# Patient Record
Sex: Male | Born: 1940 | Race: White | Hispanic: No | Marital: Married | Smoking: Never smoker
Health system: Southern US, Community
[De-identification: ages and names within clinical notes are randomized; demographics above are authoritative.]

## PROBLEM LIST (undated history)

## (undated) DIAGNOSIS — E119 Type 2 diabetes mellitus without complications: Secondary | ICD-10-CM

## (undated) DIAGNOSIS — E7800 Pure hypercholesterolemia, unspecified: Secondary | ICD-10-CM

## (undated) DIAGNOSIS — I639 Cerebral infarction, unspecified: Secondary | ICD-10-CM

## (undated) HISTORY — PX: BACK SURGERY: SHX140

## (undated) HISTORY — PX: HIP SURGERY: SHX245

---

## 2006-08-14 HISTORY — PX: PENILE PROSTHESIS IMPLANT: SHX240

## 2016-09-25 ENCOUNTER — Emergency Department (HOSPITAL_BASED_OUTPATIENT_CLINIC_OR_DEPARTMENT_OTHER): Payer: Medicare Other

## 2016-09-25 ENCOUNTER — Encounter (HOSPITAL_BASED_OUTPATIENT_CLINIC_OR_DEPARTMENT_OTHER): Payer: Self-pay | Admitting: Emergency Medicine

## 2016-09-25 ENCOUNTER — Emergency Department (HOSPITAL_BASED_OUTPATIENT_CLINIC_OR_DEPARTMENT_OTHER)
Admission: EM | Admit: 2016-09-25 | Discharge: 2016-09-25 | Disposition: A | Discharge status: Home / Self Care | Payer: Medicare Other | Attending: Emergency Medicine | Admitting: Emergency Medicine

## 2016-09-25 DIAGNOSIS — S0591XA Unspecified injury of right eye and orbit, initial encounter: Secondary | ICD-10-CM

## 2016-09-25 DIAGNOSIS — W228XXA Striking against or struck by other objects, initial encounter: Secondary | ICD-10-CM | POA: Diagnosis not present

## 2016-09-25 DIAGNOSIS — Y929 Unspecified place or not applicable: Secondary | ICD-10-CM | POA: Insufficient documentation

## 2016-09-25 DIAGNOSIS — Y9389 Activity, other specified: Secondary | ICD-10-CM | POA: Diagnosis not present

## 2016-09-25 DIAGNOSIS — E119 Type 2 diabetes mellitus without complications: Secondary | ICD-10-CM | POA: Insufficient documentation

## 2016-09-25 DIAGNOSIS — Z7984 Long term (current) use of oral hypoglycemic drugs: Secondary | ICD-10-CM | POA: Diagnosis not present

## 2016-09-25 DIAGNOSIS — S01111A Laceration without foreign body of right eyelid and periocular area, initial encounter: Secondary | ICD-10-CM | POA: Diagnosis not present

## 2016-09-25 DIAGNOSIS — Y999 Unspecified external cause status: Secondary | ICD-10-CM | POA: Diagnosis not present

## 2016-09-25 DIAGNOSIS — R220 Localized swelling, mass and lump, head: Secondary | ICD-10-CM | POA: Insufficient documentation

## 2016-09-25 HISTORY — DX: Pure hypercholesterolemia, unspecified: E78.00

## 2016-09-25 HISTORY — DX: Type 2 diabetes mellitus without complications: E11.9

## 2016-09-25 MED ORDER — ERYTHROMYCIN 5 MG/GM OP OINT
TOPICAL_OINTMENT | Freq: Once | OPHTHALMIC | Status: AC
  Administered 2016-09-25: 20:00:00 via OPHTHALMIC
  Filled 2016-09-25: qty 3.5

## 2016-09-25 MED ORDER — TRAMADOL HCL 50 MG PO TABS: 50.0000 mg | ORAL_TABLET | Freq: Four times a day (QID) | ORAL | 0 refills | Status: DC | PRN

## 2016-09-25 MED ORDER — TETRACAINE HCL 0.5 % OP SOLN
2.0000 [drp] | Freq: Once | OPHTHALMIC | Status: AC
  Administered 2016-09-25: 2 [drp] via OPHTHALMIC

## 2016-09-25 MED ORDER — TETANUS-DIPHTH-ACELL PERTUSSIS 5-2.5-18.5 LF-MCG/0.5 IM SUSP
0.5000 mL | Freq: Once | INTRAMUSCULAR | Status: AC
  Administered 2016-09-25: 0.5 mL via INTRAMUSCULAR
  Filled 2016-09-25: qty 0.5

## 2016-09-25 MED ORDER — TETRACAINE HCL 0.5 % OP SOLN
OPHTHALMIC | Status: AC
  Administered 2016-09-25: 2 [drp] via OPHTHALMIC
  Filled 2016-09-25: qty 4

## 2016-09-25 MED ORDER — FLUORESCEIN SODIUM 1 MG OP STRP
1.0000 | ORAL_STRIP | Freq: Once | OPHTHALMIC | Status: AC
  Administered 2016-09-25: 1 via OPHTHALMIC
  Filled 2016-09-25: qty 1

## 2016-09-25 MED ORDER — ERYTHROMYCIN 5 MG/GM OP OINT: 1.0000 "application " | TOPICAL_OINTMENT | Freq: Every day | OPHTHALMIC | 0 refills | Status: DC
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
## 2016-09-25 NOTE — ED Notes (Signed)
ED Provider at bedside. 

## 2016-09-25 NOTE — Discharge Instructions (Signed)
Follow-up with the ophthalmologist at 1:15 PM tomorrow afternoon. Return immediately for worsening pain, changes in your vision, weakness, numbness, persistent vomiting or for any concerns.  Carmela RimaNarendra Patel, M.D.  [address], [place] [address]  [phone]
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
## 2017-02-15 ENCOUNTER — Emergency Department (HOSPITAL_BASED_OUTPATIENT_CLINIC_OR_DEPARTMENT_OTHER)
Admission: EM | Admit: 2017-02-15 | Discharge: 2017-02-15 | Disposition: A | Discharge status: Home / Self Care | Payer: Medicare Other | Attending: Emergency Medicine | Admitting: Emergency Medicine

## 2017-02-15 ENCOUNTER — Encounter (HOSPITAL_BASED_OUTPATIENT_CLINIC_OR_DEPARTMENT_OTHER): Payer: Self-pay | Admitting: Emergency Medicine

## 2017-02-15 ENCOUNTER — Emergency Department (HOSPITAL_BASED_OUTPATIENT_CLINIC_OR_DEPARTMENT_OTHER): Payer: Medicare Other

## 2017-02-15 DIAGNOSIS — Z7984 Long term (current) use of oral hypoglycemic drugs: Secondary | ICD-10-CM | POA: Insufficient documentation

## 2017-02-15 DIAGNOSIS — M25512 Pain in left shoulder: Secondary | ICD-10-CM | POA: Diagnosis present

## 2017-02-15 DIAGNOSIS — E119 Type 2 diabetes mellitus without complications: Secondary | ICD-10-CM | POA: Diagnosis not present

## 2017-02-15 DIAGNOSIS — M19012 Primary osteoarthritis, left shoulder: Secondary | ICD-10-CM | POA: Diagnosis not present

## 2017-02-15 MED ORDER — NAPROXEN 250 MG PO TABS
500.0000 mg | ORAL_TABLET | Freq: Once | ORAL | Status: AC
  Administered 2017-02-15: 500 mg via ORAL
  Filled 2017-02-15: qty 2

## 2017-02-15 MED ORDER — ACETAMINOPHEN 325 MG PO TABS: 650.0000 mg | ORAL_TABLET | Freq: Four times a day (QID) | ORAL | 0 refills | Status: DC | PRN

## 2017-02-15 MED ORDER — ACETAMINOPHEN 325 MG PO TABS
650.0000 mg | ORAL_TABLET | Freq: Once | ORAL | Status: AC
  Administered 2017-02-15: 650 mg via ORAL
  Filled 2017-02-15: qty 2

## 2017-02-15 NOTE — ED Notes (Signed)
Pt and wife given d/c instructions as per chart. Rx x 1. Verbalizes understanding. No questions. 

## 2017-02-15 NOTE — ED Notes (Signed)
Pt states he has had pain in his left shoulder for awhile. +radial pulse. Moves fingers. Feels touch. Cap refill < 3 sec.

## 2017-02-15 NOTE — ED Notes (Signed)
ED Provider at bedside. 

## 2017-02-15 NOTE — Discharge Instructions (Signed)
Please read and follow all provided instructions.  Your diagnoses today include:  1. Osteoarthritis of left shoulder, unspecified osteoarthritis type     Tests performed today include: Vital signs. See below for your results today.   Medications prescribed:  Take as prescribed   Home care instructions:  Follow any educational materials contained in this packet.  Follow-up instructions: Please follow-up with your Orthopedic provider for further evaluation of symptoms and treatment   Return instructions:  Please return to the Emergency Department if you do not get better, if you get worse, or new symptoms OR  - Fever (temperature greater than 101.28F)  - Bleeding that does not stop with holding pressure to the area    -Severe pain (please note that you may be more sore the day after your accident)  - Chest Pain  - Difficulty breathing  - Severe nausea or vomiting  - Inability to tolerate food and liquids  - Passing out  - Skin becoming red around your wounds  - Change in mental status (confusion or lethargy)  - New numbness or weakness    Please return if you have any other emergent concerns.  Additional Information:  Your vital signs today were: BP (!) 145/69 (BP Location: Right Arm)    Pulse 88    Temp 98.4 F (36.9 C) (Oral)    Resp 20    Ht  (1.753 m)    Wt 88.5 kg    SpO2 98%    BMI 28.80 kg/m  If your blood pressure (BP) was elevated above 135/85 this visit, please have this repeated by your doctor within one month. ---------------

## 2017-02-15 NOTE — ED Provider Notes (Signed)
[facility]   CSN: [id number] Arrival date & time: 02/15/17  2035  By signing my name below, I, Nelwyn Salisbury, attest that this documentation has been prepared under the direction and in the presence of non-physician practitioner, Audry Pili, PA-C. Electronically Signed: Nelwyn Salisbury, Scribe. 02/15/2017. 9:47 PM.  History   Chief Complaint Chief Complaint  Patient presents with  . Shoulder Pain   The history is provided by the patient. No language interpreter was used.    HPI Comments:  Jake Bartlett is a 76 y.o. male with pmhx of DM who presents to the Emergency Department complaining of  constant, moderate but worsening left shoulder pain onset yesterday. His pain is worsened with palpation and ROM. Pt has tried aspirin for his symptoms with no relief. Denies any numbness or weakness. Pt is right-handed. He has had a pshx of hip replacement with Dr. Jearl Klinefelter, Orthopedist at [facility].   Past Medical History:  Diagnosis Date  . Diabetes mellitus without complication (HCC)   . High cholesterol     There are no active problems to display for this patient.   Past Surgical History:  Procedure Laterality Date  . HIP SURGERY         Home Medications    Prior to Admission medications   Medication Sig Start Date End Date Taking? Authorizing Provider  erythromycin ophthalmic ointment Place 1 application into the right eye daily. 09/26/16   Loren Racer, MD  metFORMIN (GLUCOPHAGE) 1000 MG tablet Take 1,000 mg by mouth 2 (two) times daily with a meal.    Historical Provider, MD  traMADol (ULTRAM) 50 MG tablet Take 1 tablet (50 mg total) by mouth every 6 (six) hours as needed. 09/25/16   Loren Racer, MD    Family History No family history on file.  Social History Social History  Substance Use Topics  . Smoking status: Never Smoker  . Smokeless tobacco: Never Used  . Alcohol use No     Allergies   Patient has no known  allergies.   Review of Systems Review of Systems  Musculoskeletal: Positive for arthralgias (Left Shoulder).  Neurological: Negative for weakness and numbness.  All other systems reviewed and are negative.    Physical Exam Updated Vital Signs BP (!) 145/69 (BP Location: Right Arm)   Pulse 88   Temp 98.4 F (36.9 C) (Oral)   Resp 20   Ht  (1.753 m)   Wt 195 lb (88.5 kg)   SpO2 98%   BMI 28.80 kg/m   Physical Exam  Constitutional: He is oriented to person, place, and time. He appears well-developed and well-nourished. No distress.  HENT:  Head: Normocephalic and atraumatic.  Eyes: Conjunctivae are normal.  Cardiovascular: Normal rate.   Pulmonary/Chest: Effort normal.  Abdominal: He exhibits no distension.  Musculoskeletal: He exhibits tenderness.  Left shoulder limited ROM due to pain. No obvious swelling. No erythema.  Neurovascularly intact with distal pulses appreciated.   Neurological: He is alert and oriented to person, place, and time.  Skin: Skin is warm and dry.  Psychiatric: He has a normal mood and affect.  Nursing note and vitals reviewed.   ED Treatments / Results  DIAGNOSTIC STUDIES:  Oxygen Saturation is 98% on RA, normal by my interpretation.    COORDINATION OF CARE:  9:52 PM Discussed treatment plan with pt at bedside which includes follow-up with his orthopedist and pt agreed to plan.  Labs (all labs ordered are listed,  but only abnormal results are displayed) Labs Reviewed - No data to display  EKG  EKG Interpretation None       Radiology Dg Shoulder Left  Result Date: 02/15/2017 CLINICAL DATA:  Left shoulder pain beginning last night. EXAM: LEFT SHOULDER - 2+ VIEW COMPARISON:  None. FINDINGS: Advanced osteoarthritis of the glenohumeral joint with loss of joint space and marginal osteophytes. Normal humeral acromial distance. AC joint is normal. IMPRESSION: Advanced osteoarthritis of the glenohumeral joint. Electronically Signed    By: Paulina Fusi M.D.   On: 02/15/2017 21:22    Procedures Procedures (including critical care time)  Medications Ordered in ED Medications - No data to display   Initial Impression / Assessment and Plan / ED Course  I have reviewed the triage vital signs and the nursing notes.  Pertinent labs & imaging results that were available during my care of the patient were reviewed by me and considered in my medical decision making (see chart for details).   {I have reviewed and evaluated the relevant imaging studies.  {I have reviewed the relevant previous healthcare records.  {I obtained HPI from historian.   ED Course:  Assessment:  Patient X-Ray negative for obvious fracture or dislocation. Shows advanced osteoarthritis.  Pt advised to follow up with orthopedics. Patient given sling while in ED, conservative therapy recommended and discussed. Patient will be discharged home & is agreeable with above plan. Returns precautions discussed. Pt appears safe for discharge.  Disposition/Plan:  DC Home Additional Verbal discharge instructions given and discussed with patient.  Pt Instructed to f/u with PCP in the next week for evaluation and treatment of symptoms. Return precautions given Pt acknowledges and agrees with plan  Supervising Physician Shaune Pollack, MD  Final Clinical Impressions(s) / ED Diagnoses   Final diagnoses:  Osteoarthritis of left shoulder, unspecified osteoarthritis type    New Prescriptions New Prescriptions   No medications on file   I personally performed the services described in this documentation, which was scribed in my presence. The recorded information has been reviewed and is accurate.    Audry Pili, PA-C 02/15/17 2203    Shaune Pollack, MD 02/16/17 [phone]

## 2017-02-15 NOTE — ED Triage Notes (Signed)
Pt presents to ED with c/o left shoulder pain that started last night and has been "loose" for about a year.

## 2022-02-24 ENCOUNTER — Emergency Department (HOSPITAL_BASED_OUTPATIENT_CLINIC_OR_DEPARTMENT_OTHER)
Admission: EM | Admit: 2022-02-24 | Discharge: 2022-02-24 | Disposition: A | Discharge status: Home / Self Care | Payer: Medicare Other | Attending: Emergency Medicine | Admitting: Emergency Medicine

## 2022-02-24 ENCOUNTER — Encounter (HOSPITAL_BASED_OUTPATIENT_CLINIC_OR_DEPARTMENT_OTHER): Payer: Self-pay | Admitting: Emergency Medicine

## 2022-02-24 ENCOUNTER — Emergency Department (HOSPITAL_BASED_OUTPATIENT_CLINIC_OR_DEPARTMENT_OTHER): Payer: Medicare Other

## 2022-02-24 ENCOUNTER — Other Ambulatory Visit: Payer: Self-pay

## 2022-02-24 DIAGNOSIS — D649 Anemia, unspecified: Secondary | ICD-10-CM | POA: Diagnosis not present

## 2022-02-24 DIAGNOSIS — N189 Chronic kidney disease, unspecified: Secondary | ICD-10-CM | POA: Insufficient documentation

## 2022-02-24 DIAGNOSIS — N132 Hydronephrosis with renal and ureteral calculous obstruction: Secondary | ICD-10-CM | POA: Insufficient documentation

## 2022-02-24 DIAGNOSIS — R1031 Right lower quadrant pain: Secondary | ICD-10-CM | POA: Diagnosis present

## 2022-02-24 DIAGNOSIS — N201 Calculus of ureter: Secondary | ICD-10-CM

## 2022-02-24 LAB — BASIC METABOLIC PANEL
Anion gap: 8 (ref 5–15)
BUN: 42 mg/dL — ABNORMAL HIGH (ref 8–23)
CO2: 19 mmol/L — ABNORMAL LOW (ref 22–32)
Calcium: 8.5 mg/dL — ABNORMAL LOW (ref 8.9–10.3)
Chloride: 109 mmol/L (ref 98–111)
Creatinine, Ser: 3.62 mg/dL — ABNORMAL HIGH (ref 0.61–1.24)
GFR, Estimated: 16 mL/min — ABNORMAL LOW (ref >60)
Glucose, Bld: 176 mg/dL — ABNORMAL HIGH (ref 70–99)
Potassium: 4.3 mmol/L (ref 3.5–5.1)
Sodium: 136 mmol/L (ref 135–145)

## 2022-02-24 LAB — URINALYSIS, ROUTINE W REFLEX MICROSCOPIC
Bilirubin Urine: NEGATIVE
Glucose, UA: 100 mg/dL — AB
Ketones, ur: NEGATIVE mg/dL
Leukocytes,Ua: NEGATIVE
Nitrite: NEGATIVE
Protein, ur: 30 mg/dL — AB
Specific Gravity, Urine: 1.015 (ref 1.005–1.030)
pH: 5.5 (ref 5.0–8.0)

## 2022-02-24 LAB — CBC WITH DIFFERENTIAL/PLATELET
Abs Immature Granulocytes: 0.01 10*3/uL (ref 0.00–0.07)
Basophils Absolute: 0.1 10*3/uL (ref 0.0–0.1)
Basophils Relative: 1 %
Eosinophils Absolute: 0.3 10*3/uL (ref 0.0–0.5)
Eosinophils Relative: 5 %
HCT: 24.5 % — ABNORMAL LOW (ref 39.0–52.0)
Hemoglobin: 8.4 g/dL — ABNORMAL LOW (ref 13.0–17.0)
Immature Granulocytes: 0 %
Lymphocytes Relative: 21 %
Lymphs Abs: 1.3 10*3/uL (ref 0.7–4.0)
MCH: 32.7 pg (ref 26.0–34.0)
MCHC: 34.3 g/dL (ref 30.0–36.0)
MCV: 95.3 fL (ref 80.0–100.0)
Monocytes Absolute: 0.7 10*3/uL (ref 0.1–1.0)
Monocytes Relative: 12 %
Neutro Abs: 3.9 10*3/uL (ref 1.7–7.7)
Neutrophils Relative %: 61 %
Platelets: 334 10*3/uL (ref 150–400)
RBC: 2.57 MIL/uL — ABNORMAL LOW (ref 4.22–5.81)
RDW: 12.4 % (ref 11.5–15.5)
WBC: 6.3 10*3/uL (ref 4.0–10.5)
nRBC: 0 % (ref 0.0–0.2)

## 2022-02-24 LAB — URINALYSIS, MICROSCOPIC (REFLEX)

## 2022-02-24 MED ORDER — ONDANSETRON HCL 4 MG/2ML IJ SOLN
4.0000 mg | Freq: Once | INTRAMUSCULAR | Status: AC
  Administered 2022-02-24: 4 mg via INTRAVENOUS
  Filled 2022-02-24: qty 2

## 2022-02-24 MED ORDER — OXYCODONE-ACETAMINOPHEN 5-325 MG PO TABS: 1.0000 | ORAL_TABLET | Freq: Four times a day (QID) | ORAL | 0 refills | Status: DC | PRN

## 2022-02-24 MED ORDER — OXYCODONE-ACETAMINOPHEN 5-325 MG PO TABS
1.0000 | ORAL_TABLET | Freq: Once | ORAL | Status: AC
  Administered 2022-02-24: 1 via ORAL
  Filled 2022-02-24: qty 1

## 2022-02-24 MED ORDER — TAMSULOSIN HCL 0.4 MG PO CAPS: 0.4000 mg | ORAL_CAPSULE | Freq: Every day | ORAL | 0 refills | Status: DC

## 2022-02-24 NOTE — ED Triage Notes (Signed)
Pt arrives pov, slow gait with cane, c/o right flank pain with nausea acutely today, hx of kidney stones. Denies dysuria, or fever. Tylenol 500 mg pta ?

## 2022-02-24 NOTE — ED Notes (Signed)
Pt verbalizes understanding of discharge instructions. Opportunity for questioning and answers were provided. Pt discharged from ED to home with spouse. 

## 2022-02-24 NOTE — ED Notes (Signed)
Pt NAD in bed, a/ox4, c/o constant RUQ/RLQ ABD pain 7/10 that worsens after eating. Pt winces with palp to RUQ. Denies flank pain, CP, SOB. +nausea, ?

## 2022-02-24 NOTE — ED Provider Notes (Signed)
?[facility] ?Provider Note ? ? ?CSN: [id number] ?Arrival date & time: 02/24/22  1800 ? ?  ? ?History ? ?Chief Complaint  ?Patient presents with  ? Flank Pain  ? ? ?Jake Bartlett is a 81 y.o. male.  He has a history of kidney stones and said he passed one a couple of weeks ago on the right side.  He is having new right lower abdominal pain radiating into his suprapubic area that started today.  Rates it as 4 out of 10.  No urinary symptoms.  States the pains feels similar to when the last time he had a kidney stone.  No fevers or chills.  Has nausea no vomiting.  Not on blood thinners. ? ?The history is provided by the patient.  ?Abdominal Pain ?Pain location:  RLQ ?Pain quality: aching   ?Pain radiates to:  Suprapubic region ?Pain severity:  Moderate ?Onset quality:  Sudden ?Duration:  1 day ?Timing:  Constant ?Progression:  Waxing and waning ?Chronicity:  Recurrent ?Context: not trauma   ?Relieved by:  Nothing ?Worsened by:  Nothing ?Ineffective treatments:  Acetaminophen ?Associated symptoms: nausea   ?Associated symptoms: no dysuria, no fever, no hematuria, no shortness of breath and no vomiting   ? ?  ? ?Home Medications ?Prior to Admission medications   ?Medication Sig Start Date End Date Taking? Authorizing Provider  ?acetaminophen (TYLENOL) 325 MG tablet Take 2 tablets (650 mg total) by mouth every 6 (six) hours as needed. 02/15/17   Shary Decamp, PA-C  ?erythromycin ophthalmic ointment Place 1 application into the right eye daily. 09/26/16   Julianne Rice, MD  ?metFORMIN (GLUCOPHAGE) 1000 MG tablet Take 1,000 mg by mouth 2 (two) times daily with a meal.    [provider]  ?traMADol (ULTRAM) 50 MG tablet Take 1 tablet (50 mg total) by mouth every 6 (six) hours as needed. 09/25/16   Julianne Rice, MD  ?   ? ?Allergies    ?Carbidopa-levodopa, Gabapentin, and Ropinirole   ? ?Review of Systems   ?Review of Systems  ?Constitutional:  Negative for fever.  ?Respiratory:   Negative for shortness of breath.   ?Gastrointestinal:  Positive for abdominal pain and nausea. Negative for vomiting.  ?Genitourinary:  Negative for dysuria and hematuria.  ? ?Physical Exam ?Updated Vital Signs ?BP (!) 149/62 (BP Location: Left Arm)   Pulse (!) 56   Temp 98.2 ?F (36.8 ?C) (Oral)   Resp 17   Ht 5\' 9"  (1.753 m)   Wt 88.9 kg   SpO2 98%   BMI 28.94 kg/m?  ?Physical Exam ?Vitals and nursing note reviewed.  ?Constitutional:   ?   General: He is not in acute distress. ?   Appearance: Normal appearance. He is well-developed.  ?HENT:  ?   Head: Normocephalic and atraumatic.  ?Eyes:  ?   Conjunctiva/sclera: Conjunctivae normal.  ?Cardiovascular:  ?   Rate and Rhythm: Normal rate and regular rhythm.  ?   Heart sounds: No murmur heard. ?Pulmonary:  ?   Effort: Pulmonary effort is normal. No respiratory distress.  ?   Breath sounds: Normal breath sounds.  ?Abdominal:  ?   Palpations: Abdomen is soft.  ?   Tenderness: There is no abdominal tenderness. There is no guarding or rebound.  ?Musculoskeletal:     ?   General: No tenderness.  ?   Cervical back: Neck supple.  ?Skin: ?   General: Skin is warm and dry.  ?   Capillary Refill: Capillary  refill takes less than 2 seconds.  ?Neurological:  ?   General: No focal deficit present.  ?   Mental Status: He is alert.  ? ? ?ED Results / Procedures / Treatments   ?Labs ?(all labs ordered are listed, but only abnormal results are displayed) ?Labs Reviewed  ?URINALYSIS, ROUTINE W REFLEX MICROSCOPIC - Abnormal; Notable for the following components:  ?    Result Value  ? Glucose, UA 100 (*)   ? Hgb urine dipstick MODERATE (*)   ? Protein, ur 30 (*)   ? All other components within normal limits  ?BASIC METABOLIC PANEL - Abnormal; Notable for the following components:  ? CO2 19 (*)   ? Glucose, Bld 176 (*)   ? BUN 42 (*)   ? Creatinine, Ser 3.62 (*)   ? Calcium 8.5 (*)   ? GFR, Estimated 16 (*)   ? All other components within normal limits  ?CBC WITH  DIFFERENTIAL/PLATELET - Abnormal; Notable for the following components:  ? RBC 2.57 (*)   ? Hemoglobin 8.4 (*)   ? HCT 24.5 (*)   ? All other components within normal limits  ?URINALYSIS, MICROSCOPIC (REFLEX) - Abnormal; Notable for the following components:  ? Bacteria, UA RARE (*)   ? All other components within normal limits  ? ? ?EKG ?None ? ?Radiology ?CT Renal Stone Study ? ?Result Date: 02/24/2022 ?CLINICAL DATA:  Right flank pain EXAM: CT ABDOMEN AND PELVIS WITHOUT CONTRAST TECHNIQUE: Multidetector CT imaging of the abdomen and pelvis was performed following the standard protocol without IV contrast. RADIATION DOSE REDUCTION: This exam was performed according to the departmental dose-optimization program which includes automated exposure control, adjustment of the mA and/or kV according to patient size and/or use of iterative reconstruction technique. COMPARISON:  12/10/2017 FINDINGS: Lower chest: No acute abnormality. Coronary artery and aortic calcifications. Hepatobiliary: Low-density lesion in the left hepatic lobe measuring 1.9 cm, stable since prior study compatible with cyst. No suspicious focal hepatic abnormality. Gallbladder unremarkable. Pancreas: No focal abnormality or ductal dilatation. Spleen: No focal abnormality.  Normal size. Adrenals/Urinary Tract: Adrenal glands unremarkable. Moderate right hydronephrosis due to 5 mm mid right ureteral stone. Urinary bladder partially decompressed and obscured by beam hardening artifact from the right hip replacement. Exophytic lesion off the posterior left kidney measures 2.3 cm, similar prior study most likely cyst. Bilateral nonobstructing renal stones. Stomach/Bowel: Sigmoid diverticulosis. No active diverticulitis. Stomach and small bowel decompressed, unremarkable. Vascular/Lymphatic: Aortic atherosclerosis. No evidence of aneurysm or adenopathy. Reproductive: Prostate enlargement.  Penile implant noted. Other: No free fluid or free air.2  Musculoskeletal: No acute bony abnormality. Prior right hip replacement. Degenerative changes in the lumbar spine. IMPRESSION: 5 mm mid right ureteral stone with mild right hydronephrosis. Bilateral nephrolithiasis. Aortic atherosclerosis, coronary artery disease. Sigmoid diverticulosis. Prostate enlargement. Electronically Signed   By: Rolm Baptise M.D.   On: 02/24/2022 19:13   ? ?Procedures ?Procedures  ? ? ?Medications Ordered in ED ?Medications  ?oxyCODONE-acetaminophen (PERCOCET/ROXICET) 5-325 MG per tablet 1 tablet (1 tablet Oral Given 02/24/22 1941)  ?ondansetron (ZOFRAN) injection 4 mg (4 mg Intravenous Given 02/24/22 1941)  ? ? ?ED Course/ Medical Decision Making/ A&P ?Clinical Course as of 02/25/22 1035  ?Sun Feb 24, 2022  ?1835 Hemoglobin was 9.7 on 4/23.  He is 8.4 today. [MB]  ?1904 CT showing right mid ureteral stone.  Awaiting radiology reading. [MB]  ?1910 Patient has a history of CKD and last creatinine was 3.24.  Patient's creatinine elevated here at 3.6. [  MB]  ?1933 Patient was aware of the CKD and follows with nephrology.  He is also aware of the anemia. [MB]  ?  ?Clinical Course User Index ?[MB] Hayden Rasmussen, MD  ? ?                        ?Medical Decision Making ?Amount and/or Complexity of Data Reviewed ?Labs: ordered. ?Radiology: ordered. ? ?Risk ?Prescription drug management. ? ?This patient complains of right lower quadrant pain; this involves an extensive number of treatment ?Options and is a complaint that carries with it a high risk of complications and ?morbidity. The differential includes renal colic, pyelonephritis, appendicitis, colitis, diverticulitis, obstruction, hernia ? ?I ordered, reviewed and interpreted labs, which included CBC with normal white count, hemoglobin low slightly lower than priors, chemistries with low bicarb elevated creatinine consistent with CKD similar to priors, urinalysis without clear signs of infection ?I ordered medication oral pain medication nausea  medication and reviewed PMP when indicated. ?I ordered imaging studies which included CT renal and I independently ?   visualized and interpreted imaging which showed 5 mm mid ureteral stone ?Additional history obtained from patient's wife ?P

## 2022-02-24 NOTE — Discharge Instructions (Signed)
You were seen in the emergency department for right-sided abdominal pain.  You have a 5 mm kidney stone.  This will need close follow-up with your urologist.  You will need repeat blood work soon to monitor your kidney function.  We are prescribing some pain medication.  Please use care with this as it may make you nauseous and constipated.  Please drink plenty of fluids.  Return to the emergency department if any high fevers or uncontrolled pain. ?

## 2022-04-12 ENCOUNTER — Inpatient Hospital Stay (HOSPITAL_COMMUNITY)
Admission: EM | Admit: 2022-04-12 | Discharge: 2022-04-16 | DRG: 065 | Disposition: A | Discharge status: Skilled Nursing Facility | Payer: Medicare Other | Attending: Family Medicine | Admitting: Family Medicine

## 2022-04-12 ENCOUNTER — Encounter (HOSPITAL_COMMUNITY): Payer: Self-pay | Admitting: Neurology

## 2022-04-12 ENCOUNTER — Inpatient Hospital Stay (HOSPITAL_COMMUNITY): Payer: Medicare Other

## 2022-04-12 ENCOUNTER — Other Ambulatory Visit: Payer: Self-pay

## 2022-04-12 ENCOUNTER — Emergency Department (HOSPITAL_COMMUNITY): Payer: Medicare Other

## 2022-04-12 DIAGNOSIS — I6389 Other cerebral infarction: Secondary | ICD-10-CM | POA: Diagnosis not present

## 2022-04-12 DIAGNOSIS — I10 Essential (primary) hypertension: Secondary | ICD-10-CM | POA: Diagnosis not present

## 2022-04-12 DIAGNOSIS — I619 Nontraumatic intracerebral hemorrhage, unspecified: Secondary | ICD-10-CM | POA: Diagnosis present

## 2022-04-12 DIAGNOSIS — G20 Parkinson's disease: Secondary | ICD-10-CM | POA: Diagnosis present

## 2022-04-12 DIAGNOSIS — Z79899 Other long term (current) drug therapy: Secondary | ICD-10-CM | POA: Diagnosis not present

## 2022-04-12 DIAGNOSIS — N1832 Chronic kidney disease, stage 3b: Secondary | ICD-10-CM | POA: Diagnosis present

## 2022-04-12 DIAGNOSIS — R29704 NIHSS score 4: Secondary | ICD-10-CM | POA: Diagnosis present

## 2022-04-12 DIAGNOSIS — G2581 Restless legs syndrome: Secondary | ICD-10-CM | POA: Diagnosis present

## 2022-04-12 DIAGNOSIS — G8194 Hemiplegia, unspecified affecting left nondominant side: Secondary | ICD-10-CM | POA: Diagnosis present

## 2022-04-12 DIAGNOSIS — E11649 Type 2 diabetes mellitus with hypoglycemia without coma: Secondary | ICD-10-CM | POA: Diagnosis not present

## 2022-04-12 DIAGNOSIS — L8990 Pressure ulcer of unspecified site, unspecified stage: Secondary | ICD-10-CM | POA: Insufficient documentation

## 2022-04-12 DIAGNOSIS — R29810 Facial weakness: Secondary | ICD-10-CM | POA: Diagnosis present

## 2022-04-12 DIAGNOSIS — Z9109 Other allergy status, other than to drugs and biological substances: Secondary | ICD-10-CM

## 2022-04-12 DIAGNOSIS — I129 Hypertensive chronic kidney disease with stage 1 through stage 4 chronic kidney disease, or unspecified chronic kidney disease: Secondary | ICD-10-CM | POA: Diagnosis present

## 2022-04-12 DIAGNOSIS — E1122 Type 2 diabetes mellitus with diabetic chronic kidney disease: Secondary | ICD-10-CM | POA: Diagnosis present

## 2022-04-12 DIAGNOSIS — Z794 Long term (current) use of insulin: Secondary | ICD-10-CM | POA: Diagnosis not present

## 2022-04-12 DIAGNOSIS — Z87442 Personal history of urinary calculi: Secondary | ICD-10-CM | POA: Diagnosis not present

## 2022-04-12 DIAGNOSIS — E7800 Pure hypercholesterolemia, unspecified: Secondary | ICD-10-CM | POA: Diagnosis present

## 2022-04-12 DIAGNOSIS — Z96641 Presence of right artificial hip joint: Secondary | ICD-10-CM | POA: Diagnosis present

## 2022-04-12 DIAGNOSIS — L89151 Pressure ulcer of sacral region, stage 1: Secondary | ICD-10-CM | POA: Diagnosis present

## 2022-04-12 DIAGNOSIS — I618 Other nontraumatic intracerebral hemorrhage: Secondary | ICD-10-CM | POA: Diagnosis not present

## 2022-04-12 DIAGNOSIS — I161 Hypertensive emergency: Secondary | ICD-10-CM | POA: Diagnosis not present

## 2022-04-12 DIAGNOSIS — I610 Nontraumatic intracerebral hemorrhage in hemisphere, subcortical: Secondary | ICD-10-CM

## 2022-04-12 HISTORY — DX: Cerebral infarction, unspecified: I63.9

## 2022-04-12 LAB — CBC
HCT: 26.6 % — ABNORMAL LOW (ref 39.0–52.0)
Hemoglobin: 8.4 g/dL — ABNORMAL LOW (ref 13.0–17.0)
MCH: 31.8 pg (ref 26.0–34.0)
MCHC: 31.6 g/dL (ref 30.0–36.0)
MCV: 100.8 fL — ABNORMAL HIGH (ref 80.0–100.0)
Platelets: 333 10*3/uL (ref 150–400)
RBC: 2.64 MIL/uL — ABNORMAL LOW (ref 4.22–5.81)
RDW: 13.2 % (ref 11.5–15.5)
WBC: 6.4 10*3/uL (ref 4.0–10.5)
nRBC: 0 % (ref 0.0–0.2)

## 2022-04-12 LAB — I-STAT CHEM 8, ED
BUN: 37 mg/dL — ABNORMAL HIGH (ref 8–23)
Calcium, Ion: 1.13 mmol/L — ABNORMAL LOW (ref 1.15–1.40)
Chloride: 108 mmol/L (ref 98–111)
Creatinine, Ser: 3.3 mg/dL — ABNORMAL HIGH (ref 0.61–1.24)
Glucose, Bld: 153 mg/dL — ABNORMAL HIGH (ref 70–99)
HCT: 26 % — ABNORMAL LOW (ref 39.0–52.0)
Hemoglobin: 8.8 g/dL — ABNORMAL LOW (ref 13.0–17.0)
Potassium: 4.6 mmol/L (ref 3.5–5.1)
Sodium: 139 mmol/L (ref 135–145)
TCO2: 20 mmol/L — ABNORMAL LOW (ref 22–32)

## 2022-04-12 LAB — GLUCOSE, CAPILLARY
Glucose-Capillary: 142 mg/dL — ABNORMAL HIGH (ref 70–99)
Glucose-Capillary: 230 mg/dL — ABNORMAL HIGH (ref 70–99)

## 2022-04-12 LAB — COMPREHENSIVE METABOLIC PANEL
ALT: 10 U/L (ref 0–44)
AST: 21 U/L (ref 15–41)
Albumin: 3.4 g/dL — ABNORMAL LOW (ref 3.5–5.0)
Alkaline Phosphatase: 114 U/L (ref 38–126)
Anion gap: 8 (ref 5–15)
BUN: 35 mg/dL — ABNORMAL HIGH (ref 8–23)
CO2: 19 mmol/L — ABNORMAL LOW (ref 22–32)
Calcium: 8.8 mg/dL — ABNORMAL LOW (ref 8.9–10.3)
Chloride: 111 mmol/L (ref 98–111)
Creatinine, Ser: 3 mg/dL — ABNORMAL HIGH (ref 0.61–1.24)
GFR, Estimated: 20 mL/min — ABNORMAL LOW (ref >60)
Glucose, Bld: 163 mg/dL — ABNORMAL HIGH (ref 70–99)
Potassium: 4.5 mmol/L (ref 3.5–5.1)
Sodium: 138 mmol/L (ref 135–145)
Total Bilirubin: 0.4 mg/dL (ref 0.3–1.2)
Total Protein: 6.9 g/dL (ref 6.5–8.1)

## 2022-04-12 LAB — DIFFERENTIAL
Abs Immature Granulocytes: 0.02 10*3/uL (ref 0.00–0.07)
Basophils Absolute: 0.1 10*3/uL (ref 0.0–0.1)
Basophils Relative: 1 %
Eosinophils Absolute: 0.2 10*3/uL (ref 0.0–0.5)
Eosinophils Relative: 3 %
Immature Granulocytes: 0 %
Lymphocytes Relative: 27 %
Lymphs Abs: 1.8 10*3/uL (ref 0.7–4.0)
Monocytes Absolute: 0.7 10*3/uL (ref 0.1–1.0)
Monocytes Relative: 11 %
Neutro Abs: 3.7 10*3/uL (ref 1.7–7.7)
Neutrophils Relative %: 58 %

## 2022-04-12 LAB — PROTIME-INR
INR: 1.1 (ref 0.8–1.2)
Prothrombin Time: 13.9 seconds (ref 11.4–15.2)

## 2022-04-12 LAB — APTT: aPTT: 32 seconds (ref 24–36)

## 2022-04-12 LAB — MRSA NEXT GEN BY PCR, NASAL: MRSA by PCR Next Gen: NOT DETECTED

## 2022-04-12 LAB — HEMOGLOBIN A1C
Hgb A1c MFr Bld: 7 % — ABNORMAL HIGH (ref 4.8–5.6)
Mean Plasma Glucose: 154.2 mg/dL

## 2022-04-12 LAB — CBG MONITORING, ED: Glucose-Capillary: 158 mg/dL — ABNORMAL HIGH (ref 70–99)

## 2022-04-12 LAB — ETHANOL: Alcohol, Ethyl (B): <10 mg/dL (ref <10)

## 2022-04-12 MED ORDER — ACETAMINOPHEN 325 MG PO TABS
650.0000 mg | ORAL_TABLET | ORAL | Status: DC | PRN
  Administered 2022-04-13 – 2022-04-16 (×7): 650 mg via ORAL
  Filled 2022-04-12 (×7): qty 2

## 2022-04-12 MED ORDER — ADULT MULTIVITAMIN W/MINERALS CH
1.0000 | ORAL_TABLET | Freq: Every day | ORAL | Status: DC
Start: 2022-04-13 — End: 2022-04-16
  Administered 2022-04-13 – 2022-04-16 (×4): 1 via ORAL
  Filled 2022-04-12 (×4): qty 1

## 2022-04-12 MED ORDER — AMLODIPINE BESYLATE 5 MG PO TABS: 5.0000 mg | ORAL_TABLET | Freq: Every day | ORAL | Status: DC

## 2022-04-12 MED ORDER — PANTOPRAZOLE SODIUM 40 MG PO TBEC
40.0000 mg | DELAYED_RELEASE_TABLET | Freq: Every day | ORAL | Status: DC
  Administered 2022-04-13 – 2022-04-16 (×4): 40 mg via ORAL
  Filled 2022-04-12 (×4): qty 1

## 2022-04-12 MED ORDER — ROSUVASTATIN CALCIUM 5 MG PO TABS
10.0000 mg | ORAL_TABLET | Freq: Every day | ORAL | Status: DC
  Administered 2022-04-12 – 2022-04-15 (×4): 10 mg via ORAL
  Filled 2022-04-12 (×5): qty 2

## 2022-04-12 MED ORDER — FOLIC ACID 1 MG PO TABS
1.0000 mg | ORAL_TABLET | Freq: Every day | ORAL | Status: DC
  Administered 2022-04-13 – 2022-04-16 (×4): 1 mg via ORAL
  Filled 2022-04-12 (×4): qty 1

## 2022-04-12 MED ORDER — CHLORHEXIDINE GLUCONATE CLOTH 2 % EX PADS
6.0000 | MEDICATED_PAD | Freq: Every day | CUTANEOUS | Status: DC
  Administered 2022-04-13 – 2022-04-14 (×2): 6 via TOPICAL

## 2022-04-12 MED ORDER — INSULIN ASPART 100 UNIT/ML IJ SOLN
0.0000 [IU] | Freq: Three times a day (TID) | INTRAMUSCULAR | Status: DC
  Administered 2022-04-13: 8 [IU] via SUBCUTANEOUS
  Administered 2022-04-13: 2 [IU] via SUBCUTANEOUS
  Administered 2022-04-13 – 2022-04-14 (×2): 3 [IU] via SUBCUTANEOUS
  Administered 2022-04-14: 5 [IU] via SUBCUTANEOUS
  Administered 2022-04-15: 3 [IU] via SUBCUTANEOUS
  Administered 2022-04-15: 5 [IU] via SUBCUTANEOUS
  Administered 2022-04-15: 3 [IU] via SUBCUTANEOUS
  Administered 2022-04-16: 2 [IU] via SUBCUTANEOUS
  Administered 2022-04-16: 3 [IU] via SUBCUTANEOUS

## 2022-04-12 MED ORDER — STROKE: EARLY STAGES OF RECOVERY BOOK
Freq: Once | Status: AC
Start: 2022-04-13 — End: 2022-04-13
  Filled 2022-04-12: qty 1

## 2022-04-12 MED ORDER — PANTOPRAZOLE SODIUM 40 MG IV SOLR
40.0000 mg | Freq: Every day | INTRAVENOUS | Status: DC
  Administered 2022-04-12: 40 mg via INTRAVENOUS
  Filled 2022-04-12: qty 10

## 2022-04-12 MED ORDER — CLEVIDIPINE BUTYRATE 0.5 MG/ML IV EMUL
0.0000 mg/h | INTRAVENOUS | Status: DC
  Administered 2022-04-12: 4 mg/h via INTRAVENOUS
  Administered 2022-04-12: 1 mg/h via INTRAVENOUS
  Administered 2022-04-13: 3 mg/h via INTRAVENOUS
  Filled 2022-04-12: qty 50
  Filled 2022-04-12 (×2): qty 100

## 2022-04-12 MED ORDER — ACETAMINOPHEN 650 MG RE SUPP: 650.0000 mg | RECTAL | Status: DC | PRN

## 2022-04-12 MED ORDER — ESCITALOPRAM OXALATE 10 MG PO TABS
20.0000 mg | ORAL_TABLET | Freq: Every day | ORAL | Status: DC
  Administered 2022-04-13 – 2022-04-16 (×4): 20 mg via ORAL
  Filled 2022-04-12 (×4): qty 2

## 2022-04-12 MED ORDER — CARBIDOPA-LEVODOPA 25-250 MG PO TABS
1.0000 | ORAL_TABLET | Freq: Two times a day (BID) | ORAL | Status: DC
  Administered 2022-04-12 – 2022-04-16 (×8): 1 via ORAL
  Filled 2022-04-12 (×10): qty 1

## 2022-04-12 MED ORDER — SENNOSIDES-DOCUSATE SODIUM 8.6-50 MG PO TABS
1.0000 | ORAL_TABLET | Freq: Two times a day (BID) | ORAL | Status: DC
Start: 2022-04-12 — End: 2022-04-16
  Administered 2022-04-12 – 2022-04-16 (×8): 1 via ORAL
  Filled 2022-04-12 (×8): qty 1

## 2022-04-12 MED ORDER — ACETAMINOPHEN 160 MG/5ML PO SOLN: 650.0000 mg | ORAL | Status: DC | PRN

## 2022-04-12 MED ORDER — TAMSULOSIN HCL 0.4 MG PO CAPS
0.4000 mg | ORAL_CAPSULE | Freq: Every day | ORAL | Status: DC
  Administered 2022-04-13 – 2022-04-16 (×4): 0.4 mg via ORAL
  Filled 2022-04-12 (×4): qty 1

## 2022-04-12 MED ORDER — SODIUM CHLORIDE 0.9% FLUSH
3.0000 mL | Freq: Once | INTRAVENOUS | Status: AC
  Administered 2022-04-12: 3 mL via INTRAVENOUS

## 2022-04-12 NOTE — Progress Notes (Signed)
Initial Nutrition Assessment  DOCUMENTATION CODES:   Not applicable  INTERVENTION:   Supplement diet as appropriate once advanced  NUTRITION DIAGNOSIS:   Inadequate oral intake related to inability to eat as evidenced by NPO status.  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   Pt just admitted; in ICU for cleviprex drip     Medications reviewed and include: protonix, senokot-s  Cleviprex @ 8 ml/hr provides: 384 kcal   Labs reviewed:  CBG's: 142-158   NUTRITION - FOCUSED PHYSICAL EXAM:  Deferred   Diet Order:   Diet Order             Diet NPO time specified  Diet effective now                   EDUCATION NEEDS:   Not appropriate for education at this time  Skin:  Skin Assessment: Reviewed RN Assessment  Last BM:  unknown  Height:   Ht Readings from Last 1 Encounters:  02/24/22 5\' 9"  (1.753 m)    Weight:   Wt Readings from Last 1 Encounters:  02/24/22 88.9 kg    BMI:  There is no height or weight on file to calculate BMI.  Estimated Nutritional Needs:   Kcal:  1900-2100  Protein:  95-115 grams  Fluid:  >1.9 L/day  Cammy Copa., RD, LDN, CNSC See [facility] for contact information

## 2022-04-12 NOTE — ED Provider Notes (Signed)
[facility] EMERGENCY DEPARTMENT Provider Note   CSN: [id number] Arrival date & time:     An emergency department physician performed an initial assessment on this suspected stroke patient at 71.  History  Chief Complaint  Patient presents with   Code Stroke    Jake Bartlett is a 81 y.o. male.  HPI      81yo male with history of DM, nephrolithiasis, anxiety, hlpd, GERD, Parkinson's diease, htn, CKD stage 3, presents with left sided weakness and numbness.  Was able to get up but at 515AM he attempted to get up again and was unable to ambulate.    Reports some speech difficulties.  Denies any acute visual changes this AM. Has had some mild headache and nausea. Denies chest pain, dyspnea, syncope  Past Medical History:  Diagnosis Date   Diabetes mellitus without complication (HCC)    High cholesterol    Stroke (HCC)      Home Medications Prior to Admission medications   Medication Sig Start Date End Date Taking? Authorizing Provider  acetaminophen (TYLENOL) 500 MG tablet Take 500 mg by mouth daily as needed for mild pain.   Yes [provider]  amLODipine (NORVASC) 5 MG tablet Take 5 mg by mouth daily. 01/16/22  Yes [provider]  carbidopa-levodopa (SINEMET IR) 25-250 MG tablet Take 1 tablet by mouth 2 (two) times daily. 01/09/22  Yes [provider]  escitalopram (LEXAPRO) 20 MG tablet Take 20 mg by mouth daily. 04/10/22  Yes [provider]  folic acid (FOLVITE) 1 MG tablet Take 1 mg by mouth daily. 11/23/21  Yes [provider]  insulin NPH-regular Human (70-30) 100 UNIT/ML injection Inject 30 Units into the skin daily.   Yes [provider]  Multiple Vitamin (MULTIVITAMIN) tablet Take 1 tablet by mouth daily.   Yes [provider]  rosuvastatin (CRESTOR) 10 MG tablet Take 10 mg by mouth at bedtime. 01/16/22  Yes [provider]  tamsulosin (FLOMAX) 0.4 MG CAPS capsule Take 1 capsule  (0.4 mg total) by mouth daily. 02/24/22  Yes Terrilee Files, MD  glipiZIDE (GLUCOTROL) 5 MG tablet Take 5 mg by mouth daily. Patient not taking: Reported on 04/12/2022 12/31/21   [provider]  oxyCODONE-acetaminophen (PERCOCET/ROXICET) 5-325 MG tablet Take 1 tablet by mouth every 6 (six) hours as needed for severe pain. Patient not taking: Reported on 04/12/2022 02/24/22   Terrilee Files, MD      Allergies    Neurontin [gabapentin] and Requip [ropinirole]    Review of Systems   Review of Systems  Physical Exam Updated Vital Signs BP (!) 120/58   Pulse 60   Temp 98.5 F (36.9 C) (Oral)   Resp 17   SpO2 92%  Physical Exam Vitals and nursing note reviewed.  Constitutional:      General: He is not in acute distress.    Appearance: He is well-developed. He is not diaphoretic.  HENT:     Head: Normocephalic and atraumatic.  Eyes:     Conjunctiva/sclera: Conjunctivae normal.  Cardiovascular:     Rate and Rhythm: Normal rate and regular rhythm.     Heart sounds: Normal heart sounds. No murmur heard.    No friction rub. No gallop.  Pulmonary:     Effort: Pulmonary effort is normal. No respiratory distress.     Breath sounds: Normal breath sounds. No wheezing or rales.  Abdominal:     General: There is no distension.  Palpations: Abdomen is soft.     Tenderness: There is no abdominal tenderness. There is no guarding.  Musculoskeletal:     Cervical back: Normal range of motion.  Skin:    General: Skin is warm and dry.  Neurological:     Mental Status: He is alert and oriented to person, place, and time.     ED Results / Procedures / Treatments   Labs (all labs ordered are listed, but only abnormal results are displayed) Labs Reviewed  CBC - Abnormal; Notable for the following components:      Result Value   RBC 2.64 (*)    Hemoglobin 8.4 (*)    HCT 26.6 (*)    MCV 100.8 (*)    All other components within normal limits  COMPREHENSIVE METABOLIC PANEL -  Abnormal; Notable for the following components:   CO2 19 (*)    Glucose, Bld 163 (*)    BUN 35 (*)    Creatinine, Ser 3.00 (*)    Calcium 8.8 (*)    Albumin 3.4 (*)    GFR, Estimated 20 (*)    All other components within normal limits  GLUCOSE, CAPILLARY - Abnormal; Notable for the following components:   Glucose-Capillary 142 (*)    All other components within normal limits  GLUCOSE, CAPILLARY - Abnormal; Notable for the following components:   Glucose-Capillary 230 (*)    All other components within normal limits  HEMOGLOBIN A1C - Abnormal; Notable for the following components:   Hgb A1c MFr Bld 7.0 (*)    All other components within normal limits  I-STAT CHEM 8, ED - Abnormal; Notable for the following components:   BUN 37 (*)    Creatinine, Ser 3.30 (*)    Glucose, Bld 153 (*)    Calcium, Ion 1.13 (*)    TCO2 20 (*)    Hemoglobin 8.8 (*)    HCT 26.0 (*)    All other components within normal limits  CBG MONITORING, ED - Abnormal; Notable for the following components:   Glucose-Capillary 158 (*)    All other components within normal limits  MRSA NEXT GEN BY PCR, NASAL  PROTIME-INR  APTT  DIFFERENTIAL  ETHANOL  CBC  BASIC METABOLIC PANEL  LIPID PANEL    EKG EKG Interpretation  Date/Time:  Friday April 12 2022 09:45:24 EDT Ventricular Rate:  59 PR Interval:    QRS Duration: 88 QT Interval:  436 QTC Calculation: 432 R Axis:   25 Text Interpretation: Atrial fibrillation Confirmed by Alvira Monday ([id number]) on 04/12/2022 11:34:13 PM  Radiology CT HEAD WO CONTRAST ( )  Result Date: 04/12/2022 CLINICAL DATA:  Neuro deficit with acute stroke suspected. Follow-up right thalamic ICH. EXAM: CT HEAD WITHOUT CONTRAST TECHNIQUE: Contiguous axial images were obtained from the base of the skull through the vertex without intravenous contrast. RADIATION DOSE REDUCTION: This exam was performed according to the departmental dose-optimization program which includes automated  exposure control, adjustment of the mA and/or kV according to patient size and/or use of iterative reconstruction technique. COMPARISON:  Head CT from earlier the same day FINDINGS: Brain: An ovoid hematoma in the lower right thalamus has a slightly more rounded medial component but overall maximal dimensions are similar at 21 x 10 x 8 mm (prior is remeasured to match the current study). No new site of hemorrhage. No evidence of acute infarct or mass. Generalized atrophy. Vascular: No hyperdense vessel or unexpected calcification. Skull: Normal. Negative for fracture or focal lesion. Sinuses/Orbits: No acute  finding. IMPRESSION: Known acute hemorrhage in the right thalamus with increased fullness of the medial aspect but no worrisome growth. No intraventricular extension or mass effect. Electronically Signed   By: Tiburcio Pea M.D.   On: 04/12/2022 17:04   MR BRAIN WO CONTRAST  Result Date: 04/12/2022 CLINICAL DATA:  Right thalamic hemorrhage EXAM: MRI HEAD WITHOUT CONTRAST MRA HEAD WITHOUT CONTRAST MRA NECK WITHOUT CONTRAST TECHNIQUE: Multiplanar, multiecho pulse sequences of the brain and surrounding structures were obtained without intravenous contrast. Angiographic images of the Circle of Willis were obtained using MRA technique without intravenous contrast. Angiographic images of the neck were obtained using MRA technique without intravenous contrast. Carotid stenosis measurements (when applicable) are obtained utilizing NASCET criteria, using the distal internal carotid diameter as the denominator. COMPARISON:  None Available. FINDINGS: Motion artifact is present. MRI HEAD Brain: Parenchymal hemorrhage is present within the right thalamus with involvement of adjacent white matter extending into the cerebral peduncle. The hemorrhage appears to be acute. Mild diffusion hyperintensity in this region is probably related to artifact from the hemorrhage rather than underlying ischemic infarct. There is mild  associated edema. No evidence of prior hemorrhage elsewhere. Prominence of the ventricles and sulci reflects generalized parenchymal volume loss. Patchy T2 hyperintensity in the supratentorial white matter is nonspecific but may reflect mild to moderate chronic microvascular ischemic changes. Vascular: Major vessel flow voids at the skull base are preserved. Skull and upper cervical spine: Normal marrow signal is preserved. Sinuses/Orbits: Minor mucosal thickening. Bilateral lens replacements. Other: Sella is unremarkable.  Mastoid air cells are clear. MRA HEAD Intracranial internal carotid arteries are patent. Proximal middle and anterior cerebral arteries are patent. Intracranial vertebral arteries, basilar artery, posterior cerebral arteries are patent. Mild stenosis proximal right P2 PCA. MRA NECK Great vessel origins are patent. Common, internal, and external carotid arteries are patent. Mild atherosclerotic irregularity at the ICA origins. Extracranial vertebral arteries are patent. Left vertebral is dominant. There is no hemodynamically significant stenosis. IMPRESSION: Motion degraded. Acute right thalamic hemorrhage extending into adjacent white matter and cerebral peduncle with mild edema. Chronic microvascular ischemic changes. No large vessel occlusion or hemodynamically significant stenosis. Electronically Signed   By: Guadlupe Spanish M.D.   On: 04/12/2022 16:51   MR ANGIO HEAD WO CONTRAST  Result Date: 04/12/2022 CLINICAL DATA:  Right thalamic hemorrhage EXAM: MRI HEAD WITHOUT CONTRAST MRA HEAD WITHOUT CONTRAST MRA NECK WITHOUT CONTRAST TECHNIQUE: Multiplanar, multiecho pulse sequences of the brain and surrounding structures were obtained without intravenous contrast. Angiographic images of the Circle of Willis were obtained using MRA technique without intravenous contrast. Angiographic images of the neck were obtained using MRA technique without intravenous contrast. Carotid stenosis measurements  (when applicable) are obtained utilizing NASCET criteria, using the distal internal carotid diameter as the denominator. COMPARISON:  None Available. FINDINGS: Motion artifact is present. MRI HEAD Brain: Parenchymal hemorrhage is present within the right thalamus with involvement of adjacent white matter extending into the cerebral peduncle. The hemorrhage appears to be acute. Mild diffusion hyperintensity in this region is probably related to artifact from the hemorrhage rather than underlying ischemic infarct. There is mild associated edema. No evidence of prior hemorrhage elsewhere. Prominence of the ventricles and sulci reflects generalized parenchymal volume loss. Patchy T2 hyperintensity in the supratentorial white matter is nonspecific but may reflect mild to moderate chronic microvascular ischemic changes. Vascular: Major vessel flow voids at the skull base are preserved. Skull and upper cervical spine: Normal marrow signal is preserved. Sinuses/Orbits: Minor mucosal thickening. Bilateral  lens replacements. Other: Sella is unremarkable.  Mastoid air cells are clear. MRA HEAD Intracranial internal carotid arteries are patent. Proximal middle and anterior cerebral arteries are patent. Intracranial vertebral arteries, basilar artery, posterior cerebral arteries are patent. Mild stenosis proximal right P2 PCA. MRA NECK Great vessel origins are patent. Common, internal, and external carotid arteries are patent. Mild atherosclerotic irregularity at the ICA origins. Extracranial vertebral arteries are patent. Left vertebral is dominant. There is no hemodynamically significant stenosis. IMPRESSION: Motion degraded. Acute right thalamic hemorrhage extending into adjacent white matter and cerebral peduncle with mild edema. Chronic microvascular ischemic changes. No large vessel occlusion or hemodynamically significant stenosis. Electronically Signed   By: Guadlupe Spanish M.D.   On: 04/12/2022 16:51   MR ANGIO NECK WO  CONTRAST  Result Date: 04/12/2022 CLINICAL DATA:  Right thalamic hemorrhage EXAM: MRI HEAD WITHOUT CONTRAST MRA HEAD WITHOUT CONTRAST MRA NECK WITHOUT CONTRAST TECHNIQUE: Multiplanar, multiecho pulse sequences of the brain and surrounding structures were obtained without intravenous contrast. Angiographic images of the Circle of Willis were obtained using MRA technique without intravenous contrast. Angiographic images of the neck were obtained using MRA technique without intravenous contrast. Carotid stenosis measurements (when applicable) are obtained utilizing NASCET criteria, using the distal internal carotid diameter as the denominator. COMPARISON:  None Available. FINDINGS: Motion artifact is present. MRI HEAD Brain: Parenchymal hemorrhage is present within the right thalamus with involvement of adjacent white matter extending into the cerebral peduncle. The hemorrhage appears to be acute. Mild diffusion hyperintensity in this region is probably related to artifact from the hemorrhage rather than underlying ischemic infarct. There is mild associated edema. No evidence of prior hemorrhage elsewhere. Prominence of the ventricles and sulci reflects generalized parenchymal volume loss. Patchy T2 hyperintensity in the supratentorial white matter is nonspecific but may reflect mild to moderate chronic microvascular ischemic changes. Vascular: Major vessel flow voids at the skull base are preserved. Skull and upper cervical spine: Normal marrow signal is preserved. Sinuses/Orbits: Minor mucosal thickening. Bilateral lens replacements. Other: Sella is unremarkable.  Mastoid air cells are clear. MRA HEAD Intracranial internal carotid arteries are patent. Proximal middle and anterior cerebral arteries are patent. Intracranial vertebral arteries, basilar artery, posterior cerebral arteries are patent. Mild stenosis proximal right P2 PCA. MRA NECK Great vessel origins are patent. Common, internal, and external carotid  arteries are patent. Mild atherosclerotic irregularity at the ICA origins. Extracranial vertebral arteries are patent. Left vertebral is dominant. There is no hemodynamically significant stenosis. IMPRESSION: Motion degraded. Acute right thalamic hemorrhage extending into adjacent white matter and cerebral peduncle with mild edema. Chronic microvascular ischemic changes. No large vessel occlusion or hemodynamically significant stenosis. Electronically Signed   By: Guadlupe Spanish M.D.   On: 04/12/2022 16:51   CT HEAD CODE STROKE WO CONTRAST  Result Date: 04/12/2022 CLINICAL DATA:  Code stroke.  Neuro deficit, acute, stroke suspected EXAM: CT HEAD WITHOUT CONTRAST TECHNIQUE: Contiguous axial images were obtained from the base of the skull through the vertex without intravenous contrast. RADIATION DOSE REDUCTION: This exam was performed according to the departmental dose-optimization program which includes automated exposure control, adjustment of the mA and/or kV according to patient size and/or use of iterative reconstruction technique. COMPARISON:  2017 FINDINGS: Brain: There is hyperdense hemorrhage within the right thalamus extending into the adjacent white matter and toward the cerebral peduncle. Measures about 0.7 x 1.6 x 1.1 cm. Mild associated edema. No mass effect. Prominence of the ventricles and sulci reflects generalized parenchymal volume loss. Patchy hypoattenuation  in the supratentorial white matter is nonspecific but may reflect mild to moderate chronic microvascular ischemic changes. There is no extra-axial collection. Vascular: No hyperdense vessel. Intracranial atherosclerotic calcification at the skull base. Skull: No acute abnormality. Sinuses/Orbits: Minor mucosal thickening. Bilateral lens replacements. Other: Mastoid air cells are clear. IMPRESSION: Small area of hyperdense hemorrhage within the right thalamus extending into adjacent white matter and toward the cerebral peduncle. Suspect  this could be subacute rather than acute and chronicity could be evaluated by MRI. These results were called by telephone at the time of interpretation on 04/12/2022 at 9:42 am to provider Dr. Selina Cooley, who verbally acknowledged these results. Electronically Signed   By: Guadlupe Spanish M.D.   On: 04/12/2022 09:45    Procedures Procedures    Medications Ordered in ED Medications  clevidipine (CLEVIPREX) infusion 0.5 mg/mL (6 mg/hr Intravenous Infusion Verify 04/12/22 2300)   stroke: early stages of recovery book (has no administration in time range)  acetaminophen (TYLENOL) tablet 650 mg (has no administration in time range)    Or  acetaminophen (TYLENOL) 160 MG/5ML solution 650 mg (has no administration in time range)    Or  acetaminophen (TYLENOL) suppository 650 mg (has no administration in time range)  senna-docusate (Senokot-S) tablet 1 tablet (1 tablet Oral Given 04/12/22 2104)  carbidopa-levodopa (SINEMET IR) 25-250 MG per tablet immediate release 1 tablet (1 tablet Oral Given 04/12/22 2104)  insulin aspart (novoLOG) injection 0-15 Units (has no administration in time range)  amLODipine (NORVASC) tablet 5 mg (has no administration in time range)  escitalopram (LEXAPRO) tablet 20 mg (has no administration in time range)  folic acid (FOLVITE) tablet 1 mg (has no administration in time range)  multivitamin with minerals tablet 1 tablet (has no administration in time range)  rosuvastatin (CRESTOR) tablet 10 mg (10 mg Oral Given 04/12/22 2324)  tamsulosin (FLOMAX) capsule 0.4 mg (has no administration in time range)  pantoprazole (PROTONIX) EC tablet 40 mg (has no administration in time range)  sodium chloride flush (NS) 0.9 % injection 3 mL (3 mLs Intravenous Given 04/12/22 0946)    ED Course/ Medical Decision Making/ A&P                           Medical Decision Making Amount and/or Complexity of Data Reviewed Labs: ordered. Radiology: ordered.  Risk Decision regarding  hospitalization.   81yo male with history of DM, nephrolithiasis, anxiety, hlpd, GERD, Parkinson's diease, htn, CKD stage 3, presents with left sided weakness and numbness as a CODE STROKE. Dr. Selina Cooley at bedside on his arrival.  Glucose WNL.    CT head reviewed by Dr. Selina Cooley, radiology and me shows small area of hyperdense hemorrhage within the right thalamus.    Started on cleviprex for blood pressure control and admitted to ICU under Neurology's care.  He denies other concerns.               Final Clinical Impression(s) / ED Diagnoses Final diagnoses:  Other right-sided nontraumatic intracerebral hemorrhage [facility])    Rx / DC Orders ED Discharge Orders     None         Alvira Monday, MD 04/12/22 2338

## 2022-04-12 NOTE — H&P (Signed)
Neurology H&P  CC: left sided weakness and numbness  History is obtained from:patient and chart  HPI: Jake Bartlett is a 81 y.o. male with a history of diabetes type 2, kidney stones, right hip replacement, anxiety, HLD, GERD, Parkinson's disease, HTN and CKD stage 3 who presents with left sided weakness and numbness.  Patient states that he got up today at 0500 and used the toilet.  At that time, he was able to ambulate well and had no symptoms.  At 0515, he attempted to get up again but was unable to ambulate due to left sided weakness.  On arrival, patient has mild weakness of left arm and leg, and sensory deficit to his left leg.  Head CT reveals a small ICH in the right thalamus which appears subacute (personally reviewed by Dr. Selina Cooley).  ICH score 1  LKW: 0500 tpa given?: No, ICH IR Thrombectomy? No, ICH Modified Rankin Scale: 2-Slight disability-UNABLE to perform all activities but does not need assistance NIHSS: 4   ROS: A complete ROS was performed and is negative except as noted in the HPI.   Past Medical History:  Diagnosis Date   Diabetes mellitus without complication (HCC)    High cholesterol      No family history on file.   Social History:  reports that he has never smoked. He has never used smokeless tobacco. He reports that he does not drink alcohol and does not use drugs.   Prior to Admission medications   Medication Sig Start Date End Date Taking? Authorizing Provider  acetaminophen (TYLENOL) 325 MG tablet Take 2 tablets (650 mg total) by mouth every 6 (six) hours as needed. 02/15/17   Audry Pili, PA-C  erythromycin ophthalmic ointment Place 1 application into the right eye daily. 09/26/16   Loren Racer, MD  metFORMIN (GLUCOPHAGE) 1000 MG tablet Take 1,000 mg by mouth 2 (two) times daily with a meal.    [provider]  oxyCODONE-acetaminophen (PERCOCET/ROXICET) 5-325 MG tablet Take 1 tablet by mouth every 6 (six) hours as needed for severe pain.  02/24/22   Terrilee Files, MD  tamsulosin (FLOMAX) 0.4 MG CAPS capsule Take 1 capsule (0.4 mg total) by mouth daily. 02/24/22   Terrilee Files, MD  traMADol (ULTRAM) 50 MG tablet Take 1 tablet (50 mg total) by mouth every 6 (six) hours as needed. 09/25/16   Loren Racer, MD     Exam: Current vital signs: BP (!) 177/59   Pulse (!) 55   Temp 98 F (36.7 C) (Oral)   Resp 18   SpO2 97%    Physical Exam  Constitutional: Appears well-developed and well-nourished.  Psych: Affect appropriate to situation Eyes: No scleral injection HENT: No OP obstrucion Head: Normocephalic.  Cardiovascular: Normal rate and regular rhythm.  Skin: WDI  Neuro: Mental Status: Patient is awake, alert, oriented to person, place, month, year, and situation. Patient is able to give a clear and coherent history. No signs of aphasia or neglect Cranial Nerves: II: Visual Fields are full. Pupils are equal, round, and reactive to light.   III,IV, VI: EOMI without ptosis or diplopia.  V: Facial sensation is symmetric to light touch VII: Facial movement is symmetric.  VIII: hearing is intact to voice XII: tongue is midline without atrophy or fasciculations.  Motor: Tone is normal. Bulk is normal. 5/5 strength was present in RUE and RLE, 4/5 strength to LUE and LLE Sensory: Sensation is symmetric to light touch in the arms with sensory deficit in  the left leg Cerebellar: No ataxia  NIHSS = 4   I have reviewed labs in epic and the pertinent results are: CBC    Component Value Date/Time   WBC 6.4 04/12/2022 0931   RBC 2.64 (L) 04/12/2022 0931   HGB 8.8 (L) 04/12/2022 0936   HCT 26.0 (L) 04/12/2022 0936   PLT 333 04/12/2022 0931   MCV 100.8 (H) 04/12/2022 0931   MCH 31.8 04/12/2022 0931   MCHC 31.6 04/12/2022 0931   RDW 13.2 04/12/2022 0931   LYMPHSABS 1.8 04/12/2022 0931   MONOABS 0.7 04/12/2022 0931   EOSABS 0.2 04/12/2022 0931   BASOSABS 0.1 04/12/2022 0931       Latest Ref Rng & Units  04/12/2022    9:36 AM 04/12/2022    9:31 AM 02/24/2022    1:45 PM  BMP  Glucose 70 - 99 mg/dL 865  784  696   BUN 8 - 23 mg/dL 37  35  42   Creatinine 0.61 - 1.24 mg/dL 2.95  2.84  1.32   Sodium 135 - 145 mmol/L 139  138  136   Potassium 3.5 - 5.1 mmol/L 4.6  4.5  4.3   Chloride 98 - 111 mmol/L 108  111  109   CO2 22 - 32 mmol/L  19  19   Calcium 8.9 - 10.3 mg/dL  8.8  8.5      I have reviewed the images obtained: Head CT: small area of hemorrhage in right thalamus, possibly subacute  Primary Diagnosis:  Nontraumatic intracerebral hemorrhage in hemisphere, subcortical  Secondary Diagnosis: Essential (primary) hypertension and CKD Stage 3 (GFR 30-59)   Impression: Right thalamic ICH, 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  Will need to be admitted to the ICU for strict blood pressure control.  Will need to obtain noncontrast CT head in 6 hours to evaluate for further bleeding.  Plan: -admit to ICU -Keep SBP 130-150 -Cleviprex gtt -Noncontrast head CT in 6 hours -brain MRI -MRA H&N, non-contrast -No antiplatelets or anticoagulation -SCDs for DVT prophylaxis -Keep HOB at 30 degrees -PT/OT/SLP   This patient is critically ill and at significant risk of neurological worsening, death and care requires constant monitoring of vital signs, hemodynamics,respiratory and cardiac monitoring, neurological assessment, discussion with family, other specialists and medical decision making of high complexity. I spent 45 minutes of neurocritical care time  in the care of  this patient. This was time spent independent of any time provided by nurse practitioner or PA.  Cortney E Ernestina Columbia , MSN, AGACNP-BC [facility] See Amion for schedule and pager information 04/12/2022 10:22 AM   Attending Neurohospitalist Addendum Patient seen and examined with APP/Resident. Agree with the history and physical as documented above. Agree with the  plan as documented, which I helped formulate. I have edited the note above to reflect my full findings and recommendations. I have independently reviewed the chart, obtained history, review of systems and examined the patient.I have personally reviewed pertinent head/neck/spine imaging (CT/MRI). Please feel free to call with any questions.  This patient is critically ill and at significant risk of neurological worsening, death and care requires constant monitoring of vital signs, hemodynamics,respiratory and cardiac monitoring, neurological assessment, discussion with family, other specialists and medical decision making of high complexity. I spent 75 minutes of neurocritical care time  in the care of  this patient. This was time spent independent of any time provided by nurse practitioner or PA.  Bing Neighbors, MD [facility] [phone]  If 7pm- 7am, please page neurology on call as listed in AMION.

## 2022-04-13 ENCOUNTER — Inpatient Hospital Stay (HOSPITAL_COMMUNITY): Payer: Medicare Other

## 2022-04-13 DIAGNOSIS — G2581 Restless legs syndrome: Secondary | ICD-10-CM

## 2022-04-13 DIAGNOSIS — I161 Hypertensive emergency: Secondary | ICD-10-CM

## 2022-04-13 DIAGNOSIS — E11649 Type 2 diabetes mellitus with hypoglycemia without coma: Secondary | ICD-10-CM

## 2022-04-13 DIAGNOSIS — E7800 Pure hypercholesterolemia, unspecified: Secondary | ICD-10-CM

## 2022-04-13 DIAGNOSIS — G20 Parkinson's disease: Secondary | ICD-10-CM

## 2022-04-13 DIAGNOSIS — I610 Nontraumatic intracerebral hemorrhage in hemisphere, subcortical: Secondary | ICD-10-CM | POA: Diagnosis not present

## 2022-04-13 LAB — LIPID PANEL
Cholesterol: 119 mg/dL (ref 0–200)
HDL: 32 mg/dL — ABNORMAL LOW (ref >40)
LDL Cholesterol: 67 mg/dL (ref 0–99)
Total CHOL/HDL Ratio: 3.7 RATIO
Triglycerides: 102 mg/dL (ref <150)
VLDL: 20 mg/dL (ref 0–40)

## 2022-04-13 LAB — BASIC METABOLIC PANEL
Anion gap: 10 (ref 5–15)
BUN: 31 mg/dL — ABNORMAL HIGH (ref 8–23)
CO2: 20 mmol/L — ABNORMAL LOW (ref 22–32)
Calcium: 8.8 mg/dL — ABNORMAL LOW (ref 8.9–10.3)
Chloride: 107 mmol/L (ref 98–111)
Creatinine, Ser: 2.89 mg/dL — ABNORMAL HIGH (ref 0.61–1.24)
GFR, Estimated: 21 mL/min — ABNORMAL LOW (ref >60)
Glucose, Bld: 170 mg/dL — ABNORMAL HIGH (ref 70–99)
Potassium: 4.6 mmol/L (ref 3.5–5.1)
Sodium: 137 mmol/L (ref 135–145)

## 2022-04-13 LAB — CBC
HCT: 25.3 % — ABNORMAL LOW (ref 39.0–52.0)
Hemoglobin: 8.1 g/dL — ABNORMAL LOW (ref 13.0–17.0)
MCH: 31 pg (ref 26.0–34.0)
MCHC: 32 g/dL (ref 30.0–36.0)
MCV: 96.9 fL (ref 80.0–100.0)
Platelets: 307 10*3/uL (ref 150–400)
RBC: 2.61 MIL/uL — ABNORMAL LOW (ref 4.22–5.81)
RDW: 13.1 % (ref 11.5–15.5)
WBC: 5.8 10*3/uL (ref 4.0–10.5)
nRBC: 0 % (ref 0.0–0.2)

## 2022-04-13 LAB — GLUCOSE, CAPILLARY
Glucose-Capillary: 259 mg/dL — ABNORMAL HIGH (ref 70–99)
Glucose-Capillary: 175 mg/dL — ABNORMAL HIGH (ref 70–99)
Glucose-Capillary: 147 mg/dL — ABNORMAL HIGH (ref 70–99)
Glucose-Capillary: 207 mg/dL — ABNORMAL HIGH (ref 70–99)

## 2022-04-13 MED ORDER — HEPARIN SODIUM (PORCINE) 5000 UNIT/ML IJ SOLN
5000.0000 [IU] | Freq: Three times a day (TID) | INTRAMUSCULAR | Status: DC
  Administered 2022-04-13 – 2022-04-16 (×9): 5000 [IU] via SUBCUTANEOUS
  Filled 2022-04-13 (×9): qty 1

## 2022-04-13 MED ORDER — AMLODIPINE BESYLATE 10 MG PO TABS
10.0000 mg | ORAL_TABLET | Freq: Every day | ORAL | Status: DC
  Administered 2022-04-13 – 2022-04-14 (×2): 10 mg via ORAL
  Filled 2022-04-13 (×2): qty 1

## 2022-04-13 MED ORDER — CARBIDOPA-LEVODOPA 25-250 MG PO TABS
1.0000 | ORAL_TABLET | Freq: Once | ORAL | Status: DC
Start: 2022-04-13 — End: 2022-04-15
  Filled 2022-04-13: qty 1

## 2022-04-13 MED ORDER — HYDRALAZINE HCL 25 MG PO TABS
25.0000 mg | ORAL_TABLET | Freq: Three times a day (TID) | ORAL | Status: DC
  Administered 2022-04-13: 25 mg via ORAL
  Filled 2022-04-13: qty 1

## 2022-04-13 NOTE — Progress Notes (Signed)
Echo attempted. Nurse stated patient is transferring to different until soon. Will attempt there as schedule allows.

## 2022-04-13 NOTE — Evaluation (Signed)
Speech Language Pathology Evaluation Patient Details Name: Jake Bartlett MRN: [id number] DOB: July 23, 1941 Today's Date: 04/13/2022 Time: 7829-5621 SLP Time Calculation (min) (ACUTE ONLY): 25 min  Problem List:  Patient Active Problem List   Diagnosis Date Noted   ICH (intracerebral hemorrhage) (HCC) 04/12/2022   Pressure injury of skin 04/12/2022   Past Medical History:  Past Medical History:  Diagnosis Date   Diabetes mellitus without complication (HCC)    High cholesterol    Stroke [facility])    Past Surgical History:  Past Surgical History:  Procedure Laterality Date   BACK SURGERY     HIP SURGERY     PENILE PROSTHESIS IMPLANT  08/14/2006   AMS Penile Prosthesis; Performed by Willadean Carol MD   HPI:  Jake Bartlett is a 81 y.o. male with a history of diabetes type 2, kidney stones, right hip replacement, anxiety, HLD, GERD, Parkinson's disease, HTN and CKD stage 3 who presents with left sided weakness and numbness. MRI remarkable for right thalamic hemorrhage.   Assessment / Plan / Recommendation Clinical Impression  Pt was seen for a cognitive-linguistic evaluation in the setting of a right thalamic hemorrhage.  Pt was encountered awake/alert and restless in bed with wife present at bedside.  Pt and wife reported that pt was independent with IADLs at baseline and that wife is primarily responsible for financial management.  Pt is retired and wife works only as needed at family business and therefore can provide assistance to pt at time of discharge.  Pt presented with grossly functional cognitive-linguistic abilities, with mild focused attention and memory deficits noted.  Expressive and receptive language were functional and no dysarthria was observed despite mild L labial asymmetry.  Pt and wife reported that pt appeared to be at his cognitive-linguistic baseline.  No additional speech therapy is recommended at this time.  Please re-consult if additional needs arise.    SLP  Assessment  SLP Recommendation/Assessment: Patient does not need any further Speech Lanaguage Pathology Services SLP Visit Diagnosis: Cognitive communication deficit (R41.841)    Recommendations for follow up therapy are one component of a multi-disciplinary discharge planning process, led by the attending physician.  Recommendations may be updated based on patient status, additional functional criteria and insurance authorization.    Follow Up Recommendations  No SLP follow up    Assistance Recommended at Discharge  Set up Supervision/Assistance  Functional Status Assessment Patient has had a recent decline in their functional status and demonstrates the ability to make significant improvements in function in a reasonable and predictable amount of time.  Frequency and Duration           SLP Evaluation Cognition  Overall Cognitive Status: Within Functional Limits for tasks assessed Arousal/Alertness: Awake/alert Orientation Level: Oriented X4 Attention: Sustained;Focused Focused Attention: Appears intact Sustained Attention: Appears intact Memory: Appears intact Awareness: Appears intact Problem Solving: Appears intact Executive Function: Reasoning Reasoning: Appears intact Behaviors: Restless Safety/Judgment: Appears intact       Comprehension  Auditory Comprehension Overall Auditory Comprehension: Appears within functional limits for tasks assessed    Expression Expression Primary Mode of Expression: Verbal Verbal Expression Overall Verbal Expression: Appears within functional limits for tasks assessed   Oral / Motor  Oral Motor/Sensory Function Overall Oral Motor/Sensory Function: Mild impairment Facial ROM: Within Functional Limits Facial Symmetry: Abnormal symmetry left;Suspected CN VII (facial) dysfunction Lingual ROM: Within Functional Limits Lingual Symmetry: Within Functional Limits Motor Speech Overall Motor Speech: Appears within functional limits for tasks  assessed  Eino Farber, M.S., CCC-SLP Acute Rehabilitation Services Office: [phone]  Shanon Rosser [facility] 04/13/2022, 8:50 AM

## 2022-04-13 NOTE — Evaluation (Signed)
Physical Therapy Evaluation Patient Details Name: Jake Bartlett MRN: [id number] DOB: Mar 05, 1941 Today's Date: 04/13/2022  History of Present Illness  81 y.o. male presenting 6/23 with acute onset weakness of the left arm and leg as well as numbness of the left leg.  On CT, he was found to have a small right thalamic ICH. PMH: DM, kidney stones, right hip replacement, anxiety, HLD, HTN, CKD3, GERD and Parkinson's disease.   Clinical Impression  Pt in bed upon arrival of PT, agreeable to evaluation at this time. Prior to admission the pt was mobilizing with use of cane or rollator, reports independence with ADLs such as dressing and feeding, but has not been taking showers in bathroom due to fear of falling. The pt now presents with limitations in functional mobility, strength, power, stability, coordination, and awareness due to above dx, and will continue to benefit from skilled PT to address these deficits. The pt required modA of 2 and max cues to complete bed mobility and safely manage LUE with movement. He also presents with poor stability, strong L lateral lean, and impaired awareness that requires frequent cues to correct and maintain. The pt was able to stand with modA of 2 but unable to initiate any steps at this time, stedy used to complete transfer to recliner at this time. Will continue to benefit from skilled PT to progress functional strength, stability, and independence with OOB mobility, recommend acute inpatient rehab when medically safe for d/c.         Recommendations for follow up therapy are one component of a multi-disciplinary discharge planning process, led by the attending physician.  Recommendations may be updated based on patient status, additional functional criteria and insurance authorization.  Follow Up Recommendations Acute inpatient rehab (3hours/day)      Assistance Recommended at Discharge Frequent or constant Supervision/Assistance  Patient can return home with  the following  Two people to help with walking and/or transfers;Two people to help with bathing/dressing/bathroom;Assistance with cooking/housework;Direct supervision/assist for medications management;Assist for transportation;Direct supervision/assist for financial management;Help with stairs or ramp for entrance    Equipment Recommendations Other (comment) (defer to post acute)  Recommendations for Other Services  Rehab consult    Functional Status Assessment Patient has had a recent decline in their functional status and demonstrates the ability to make significant improvements in function in a reasonable and predictable amount of time.     Precautions / Restrictions Precautions Precautions: Fall Precaution Comments: L lat lean, SBP 130-150 Restrictions Weight Bearing Restrictions: No      Mobility  Bed Mobility Overal bed mobility: Needs Assistance Bed Mobility: Supine to Sit     Supine to sit: Mod assist     General bed mobility comments: unaware of leaving L hemibody, assist to LLE and modA to elevate trunk    Transfers Overall transfer level: Needs assistance Equipment used: 2 person hand held assist Transfers: Sit to/from Stand, Bed to chair/wheelchair/BSC Sit to Stand: Mod assist, +2 physical assistance           General transfer comment: pt able to rise to standing with modA of 2, heavy L lateral lean and trunk flexed. Pt unable to initiate any steps and therefore used stedy to pivot to recliner. Transfer via Lift Equipment: Stedy  Ambulation/Gait               General Gait Details: pt unable to initiate steps    Balance Overall balance assessment: Needs assistance Sitting-balance support: Feet supported Sitting balance-Leahy Scale: Poor  Standing balance-Leahy Scale: Zero Standing balance comment: unaware of totally leaning on therapist to his L                             Pertinent Vitals/Pain Pain Assessment Pain  Assessment: Faces Faces Pain Scale: Hurts little more Pain Location: headache (eye discomfort) Pain Descriptors / Indicators: Discomfort, Grimacing, Moaning, Headache Pain Intervention(s): Monitored during session, Limited activity within patient's tolerance, Repositioned    Home Living Family/patient expects to be discharged to:: Private residence Living Arrangements: Spouse/significant other Available Help at Discharge: Family Type of Home: House Home Access: Level entry         Home Equipment: Rollator (4 wheels);Cane - single point      Prior Function Prior Level of Function : Independent/Modified Independent;Driving             Mobility Comments: uses a cane in community; rollator in the house to "carry things" ADLs Comments: pt reports independence, needs wife assist to manage shoes/socks but typically wears slip on shoes. does not take shower due to fear of falling     Hand Dominance   Dominant Hand: Right    Extremity/Trunk Assessment   Upper Extremity Assessment Upper Extremity Assessment: Defer to OT evaluation LUE Deficits / Details: moving LUE spontaneously however poor control of movement patterns; apparent sensory motor processing deficits; not ableto use funcitonally at this time; ableto actively intitiate shoulder movement however unable to bring hand ot mouth; movemetn patterns improve when visually attending to arm due to apparent sensory processing deficits LUE Sensation: decreased light touch;decreased proprioception LUE Coordination: decreased fine motor;decreased gross motor    Lower Extremity Assessment Lower Extremity Assessment: LLE deficits/detail;Generalized weakness LLE Deficits / Details: limited ROM against gravity at ankle, knee, and hip. unable to maintain against any pressure at hip, best strength in knee flexion at 3+/5 LLE Sensation: decreased light touch;decreased proprioception LLE Coordination: decreased fine motor;decreased gross  motor    Cervical / Trunk Assessment Cervical / Trunk Assessment: Other exceptions Cervical / Trunk Exceptions: L bias; unable to maintain midline postural control  Communication   Communication: No difficulties  Cognition Arousal/Alertness: Lethargic Behavior During Therapy: Restless, Flat affect Overall Cognitive Status: Impaired/Different from baseline Area of Impairment: Attention, Safety/judgement, Awareness, Problem solving                   Current Attention Level: Sustained     Safety/Judgement: Decreased awareness of safety, Decreased awareness of deficits Awareness: Emergent Problem Solving: Slow processing, Decreased initiation, Difficulty sequencing, Requires verbal cues General Comments: max cues to maintain eyes open, increased processing time. trying to wash his face without a washcloth in his hand; unable to problem solve how to brush his teeth without using his L hand; continued ot try to use his L hand dispite his hand not working.        General Comments General comments (skin integrity, edema, etc.): VSS, pt family arrived at end of sessio        Assessment/Plan    PT Assessment Patient needs continued PT services  PT Problem List Decreased strength;Decreased range of motion;Decreased activity tolerance;Decreased balance;Decreased mobility;Decreased coordination;Decreased cognition;Decreased safety awareness       PT Treatment Interventions DME instruction;Gait training;Stair training;Functional mobility training;Therapeutic activities;Therapeutic exercise;Balance training;Cognitive remediation;Patient/family education    PT Goals (Current goals can be found in the Care Plan section)  Acute Rehab PT Goals Patient Stated Goal: return home when able PT Goal  Formulation: With patient Time For Goal Achievement: 04/27/22 Potential to Achieve Goals: Good    Frequency Min 4X/week     Co-evaluation PT/OT/SLP Co-Evaluation/Treatment: Yes Reason  for Co-Treatment: Complexity of the patient's impairments (multi-system involvement);Necessary to address cognition/behavior during functional activity;For patient/therapist safety;To address functional/ADL transfers PT goals addressed during session: Mobility/safety with mobility;Balance;Proper use of DME;Strengthening/ROM         AM-PAC PT "6 Clicks" Mobility  Outcome Measure Help needed turning from your back to your side while in a flat bed without using bedrails?: A Lot Help needed moving from lying on your back to sitting on the side of a flat bed without using bedrails?: A Lot Help needed moving to and from a bed to a chair (including a wheelchair)?: Total Help needed standing up from a chair using your arms (e.g., wheelchair or bedside chair)?: A Lot Help needed to walk in hospital room?: Total Help needed climbing 3-5 steps with a railing? : Total 6 Click Score: 9    End of Session Equipment Utilized During Treatment: Gait belt Activity Tolerance: Patient tolerated treatment well;Patient limited by lethargy Patient left: in chair;with call bell/phone within reach;with chair alarm set;with family/visitor present Nurse Communication: Mobility status PT Visit Diagnosis: Muscle weakness (generalized) (M62.81);Other abnormalities of gait and mobility (R26.89);Hemiplegia and hemiparesis Hemiplegia - Right/Left: Left Hemiplegia - dominant/non-dominant: Non-dominant Hemiplegia - caused by: Nontraumatic intracerebral hemorrhage    Time: 1120-1158 PT Time Calculation (min) (ACUTE ONLY): 38 min   Charges:   PT Evaluation $PT Eval Moderate Complexity: 1 Mod          Vickki Muff, PT, DPT   Acute Rehabilitation Department  Ronnie Derby 04/13/2022, 3:25 PM

## 2022-04-14 ENCOUNTER — Inpatient Hospital Stay (HOSPITAL_COMMUNITY): Payer: Medicare Other

## 2022-04-14 DIAGNOSIS — G20 Parkinson's disease: Secondary | ICD-10-CM | POA: Diagnosis not present

## 2022-04-14 DIAGNOSIS — I10 Essential (primary) hypertension: Secondary | ICD-10-CM | POA: Diagnosis not present

## 2022-04-14 DIAGNOSIS — I6389 Other cerebral infarction: Secondary | ICD-10-CM | POA: Diagnosis not present

## 2022-04-14 DIAGNOSIS — E7800 Pure hypercholesterolemia, unspecified: Secondary | ICD-10-CM | POA: Diagnosis not present

## 2022-04-14 DIAGNOSIS — I610 Nontraumatic intracerebral hemorrhage in hemisphere, subcortical: Secondary | ICD-10-CM | POA: Diagnosis not present

## 2022-04-14 LAB — GLUCOSE, CAPILLARY
Glucose-Capillary: 151 mg/dL — ABNORMAL HIGH (ref 70–99)
Glucose-Capillary: 218 mg/dL — ABNORMAL HIGH (ref 70–99)
Glucose-Capillary: 160 mg/dL — ABNORMAL HIGH (ref 70–99)

## 2022-04-14 LAB — BASIC METABOLIC PANEL
Anion gap: 10 (ref 5–15)
BUN: 33 mg/dL — ABNORMAL HIGH (ref 8–23)
CO2: 19 mmol/L — ABNORMAL LOW (ref 22–32)
Calcium: 9 mg/dL (ref 8.9–10.3)
Chloride: 108 mmol/L (ref 98–111)
Creatinine, Ser: 3.11 mg/dL — ABNORMAL HIGH (ref 0.61–1.24)
GFR, Estimated: 19 mL/min — ABNORMAL LOW (ref >60)
Glucose, Bld: 181 mg/dL — ABNORMAL HIGH (ref 70–99)
Potassium: 4.4 mmol/L (ref 3.5–5.1)
Sodium: 137 mmol/L (ref 135–145)

## 2022-04-14 LAB — ECHOCARDIOGRAM COMPLETE
AR max vel: 2.5 cm2
AV Area VTI: 2.96 cm2
AV Area mean vel: 2.66 cm2
AV Mean grad: 2 mmHg
AV Peak grad: 5.3 mmHg
Ao pk vel: 1.15 m/s
Area-P 1/2: 2.21 cm2
Height: 69 in
S' Lateral: 2.8 cm
Weight: 2991.2 oz

## 2022-04-14 LAB — CBC
HCT: 26.8 % — ABNORMAL LOW (ref 39.0–52.0)
Hemoglobin: 8.9 g/dL — ABNORMAL LOW (ref 13.0–17.0)
MCH: 32 pg (ref 26.0–34.0)
MCHC: 33.2 g/dL (ref 30.0–36.0)
MCV: 96.4 fL (ref 80.0–100.0)
Platelets: 307 10*3/uL (ref 150–400)
RBC: 2.78 MIL/uL — ABNORMAL LOW (ref 4.22–5.81)
RDW: 13.2 % (ref 11.5–15.5)
WBC: 5.3 10*3/uL (ref 4.0–10.5)
nRBC: 0 % (ref 0.0–0.2)

## 2022-04-14 MED ORDER — AMLODIPINE BESYLATE 5 MG PO TABS
5.0000 mg | ORAL_TABLET | Freq: Every day | ORAL | Status: DC
  Administered 2022-04-15 – 2022-04-16 (×2): 5 mg via ORAL
  Filled 2022-04-14 (×2): qty 1

## 2022-04-14 MED ORDER — MELATONIN 5 MG PO TABS
10.0000 mg | ORAL_TABLET | Freq: Every evening | ORAL | Status: DC | PRN
  Administered 2022-04-15: 10 mg via ORAL
  Filled 2022-04-14 (×2): qty 2

## 2022-04-14 NOTE — Progress Notes (Addendum)
STROKE TEAM PROGRESS NOTE   INTERVAL HISTORY Patient is seen in his room with his wife at the bedside.  He has been hemodynamically stable overnight and his neurological exam is stable.  He will likely be transferred to CIR soon.  Vitals:   04/13/22 1931 04/14/22 0033 04/14/22 0325 04/14/22 0747  BP:  (!) 151/68 (!) 144/75 136/64  Pulse:  (!) 53 (!) 54 (!) 58  Resp:  18 18 20   Temp:  (!) 97.5 F (36.4 C) (!) 97.5 F (36.4 C) 97.8 F (36.6 C)  TempSrc:  Oral Oral Oral  SpO2:  100% 100% 100%  Weight: 84.8 kg     Height: 5\' 9"  (1.753 m)      CBC:  Recent Labs  Lab 04/12/22 0931 04/12/22 0936 04/13/22 0248 04/14/22 0330  WBC 6.4  --  5.8 5.3  NEUTROABS 3.7  --   --   --   HGB 8.4*   < > 8.1* 8.9*  HCT 26.6*   < > 25.3* 26.8*  MCV 100.8*  --  96.9 96.4  PLT 333  --  307 307   < > = values in this interval not displayed.    Basic Metabolic Panel:  Recent Labs  Lab 04/13/22 0248 04/14/22 0330  NA 137 137  K 4.6 4.4  CL 107 108  CO2 20* 19*  GLUCOSE 170* 181*  BUN 31* 33*  CREATININE 2.89* 3.11*  CALCIUM 8.8* 9.0    Lipid Panel:  Recent Labs  Lab 04/13/22 0248  CHOL 119  TRIG 102  HDL 32*  CHOLHDL 3.7  VLDL 20  LDLCALC 67    HgbA1c:  Recent Labs  Lab 04/12/22 0928  HGBA1C 7.0*    Urine Drug Screen: No results for input(s): "LABOPIA", "COCAINSCRNUR", "LABBENZ", "AMPHETMU", "THCU", "LABBARB" in the last 168 hours.  Alcohol Level  Recent Labs  Lab 04/12/22 0931  ETH <10     IMAGING past 24 hours No results found.  PHYSICAL EXAM General:  Alert, well-nourished, well-developed elderly patient in no acute distress Respiratory:  Regular, unlabored respirations on room air  NEURO:  Mental Status: AA&Ox3  Speech/Language: speech is without dysarthria or aphasia.  Fluency, and comprehension intact.  Cranial Nerves:  II: PERRL. Visual fields full.  III, IV, VI: EOMI. Eyelids elevate symmetrically.  V: Sensation is intact to light touch and  symmetrical to face.  VII: Smile is symmetrical.  VIII: hearing intact to voice. IX, X:Phonation is normal.  XII: tongue is midline without fasciculations. Motor: 5/5 strength to all muscle groups tested.  Tone: is normal and bulk is normal Sensation- Intact to light touch bilaterally.  Coordination: FTN intact bilaterally, HKS: no ataxia in BLE. Drift in LUE and LLE Gait- deferred   ASSESSMENT/PLAN Mr. Asha Sears is a 81 y.o. male with history of DM, kidney stones, right hip replacement, anxiety, HLD, HTN, CKD3, GERD and Parkinson's disease presenting with acute onset weakness of the left arm and leg as well as numbness of the left leg.  On CT, he was found to have a small right thalamic ICH.  Patient is doing well off Cleviprex and has been transferred out of the ICU.  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 MRA  no LVO or hemodynamically significant stenosis, mild stenosis of proximal right P2 PCA 2D Echo EF 55 to 60% LDL  67 HgbA1c 7.0 VTE prophylaxis -heparin subcu No antithrombotic prior to admission, now on No antithrombotic secondary to ICH Therapy recommendations:  CIR Disposition:  pending  Hypertension Home meds:  amlodipine 5 mg daily,  Stable on the high end Now on amlodipine 10->5mg  Keep SBP <160 Long-term BP goal normotensive  Hyperlipidemia Home meds:  rosuvastatin 10 mg daily LDL 67, goal < 70 Now resumed Crestor 10 Continue statin at discharge  Diabetes type II Controlled Home meds:  Insulin 70/30 30 units daily, glipizide 5 mg daily HgbA1c 7.0, goal < 7.0 CBGs SSI Close PCP follow-up for DM control  Other Stroke Risk Factors Advanced Age >/= 61   Other Active Problems Possible Parkinson's disease - Continue home Sinemet, and follow-up with neurology at [facility] RLS -continue home Sinemet, follow-up with  neurology at  [facility] History of right hip placement CKD 3B, 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 Pt was seen and examined.   Wife at bedside.  She stated that patient last night and this morning awake alert, talking to son over the phone, working with PT OT.  Currently patient worn out and sleeping.  BP stable on the low end, reduce amlodipine back to home dose 5 mg.  Crestor has resumed.  Continue Sinemet for possible Parkinson and RLS.  PT/OT recommend CIR.   For detailed assessment and plan, please refer to above/below as I have made changes wherever appropriate.   Neurology will sign off. Please call with questions. Pt will follow up with neurology at [facility] as scheduled on 05/01/2022. Thanks for the consult.   Marvel Plan, MD PhD Stroke Neurology 04/14/2022 4:23 PM    To contact Stroke Continuity provider, please refer to [email or web address]. After hours, contact General Neurology

## 2022-04-15 DIAGNOSIS — I610 Nontraumatic intracerebral hemorrhage in hemisphere, subcortical: Secondary | ICD-10-CM | POA: Diagnosis not present

## 2022-04-15 LAB — CBC
HCT: 25.7 % — ABNORMAL LOW (ref 39.0–52.0)
Hemoglobin: 8.6 g/dL — ABNORMAL LOW (ref 13.0–17.0)
MCH: 32.5 pg (ref 26.0–34.0)
MCHC: 33.5 g/dL (ref 30.0–36.0)
MCV: 97 fL (ref 80.0–100.0)
Platelets: 301 10*3/uL (ref 150–400)
RBC: 2.65 MIL/uL — ABNORMAL LOW (ref 4.22–5.81)
RDW: 13.1 % (ref 11.5–15.5)
WBC: 6.3 10*3/uL (ref 4.0–10.5)
nRBC: 0 % (ref 0.0–0.2)

## 2022-04-15 LAB — GLUCOSE, CAPILLARY
Glucose-Capillary: 236 mg/dL — ABNORMAL HIGH (ref 70–99)
Glucose-Capillary: 156 mg/dL — ABNORMAL HIGH (ref 70–99)
Glucose-Capillary: 179 mg/dL — ABNORMAL HIGH (ref 70–99)
Glucose-Capillary: 214 mg/dL — ABNORMAL HIGH (ref 70–99)

## 2022-04-15 LAB — BASIC METABOLIC PANEL
Anion gap: 10 (ref 5–15)
BUN: 38 mg/dL — ABNORMAL HIGH (ref 8–23)
CO2: 19 mmol/L — ABNORMAL LOW (ref 22–32)
Calcium: 8.6 mg/dL — ABNORMAL LOW (ref 8.9–10.3)
Chloride: 104 mmol/L (ref 98–111)
Creatinine, Ser: 3.25 mg/dL — ABNORMAL HIGH (ref 0.61–1.24)
GFR, Estimated: 18 mL/min — ABNORMAL LOW (ref >60)
Glucose, Bld: 198 mg/dL — ABNORMAL HIGH (ref 70–99)
Potassium: 4.7 mmol/L (ref 3.5–5.1)
Sodium: 133 mmol/L — ABNORMAL LOW (ref 135–145)

## 2022-04-15 MED ORDER — INSULIN GLARGINE-YFGN 100 UNIT/ML ~~LOC~~ SOLN
12.0000 [IU] | Freq: Every day | SUBCUTANEOUS | Status: DC
  Administered 2022-04-15 – 2022-04-16 (×2): 12 [IU] via SUBCUTANEOUS
  Filled 2022-04-15 (×2): qty 0.12

## 2022-04-15 MED ORDER — PRAMIPEXOLE DIHYDROCHLORIDE 0.125 MG PO TABS
0.1250 mg | ORAL_TABLET | Freq: Three times a day (TID) | ORAL | Status: DC
  Administered 2022-04-15 – 2022-04-16 (×3): 0.125 mg via ORAL
  Filled 2022-04-15 (×5): qty 1

## 2022-04-15 MED ORDER — OXYCODONE HCL 5 MG PO TABS
2.5000 mg | ORAL_TABLET | Freq: Once | ORAL | Status: AC
  Administered 2022-04-16: 2.5 mg via ORAL
  Filled 2022-04-15: qty 1

## 2022-04-15 NOTE — Progress Notes (Signed)
Occupational Therapy Treatment Patient Details Name: Jake Bartlett MRN: [id number] DOB: January 20, 1941 Today's Date: 04/15/2022   History of present illness Pt is an 81 y/o male presenting 04/12/22 with acute onset weakness of the left arm and leg as well as numbness of the left leg.  On CT, he was found to have a small right thalamic ICH. PMH: DM, kidney stones, right hip replacement, anxiety, HTN, CKD3, and Parkinson's disease   OT comments  Pt progressing towards goals, requiring mod A+2 for bed mobility and transfers with use of Stedy. Pt with L lateral lean in sitting/standing in [place] with mod A support required. Pt able to complete functional reach task with LUE x6 during session, minimal support needed last few repetitions at elbow to assist with guiding to target. Pt presenting with impairments listed below, will follow acutely. Continue to recommend AIR at d/c.   Recommendations for follow up therapy are one component of a multi-disciplinary discharge planning process, led by the attending physician.  Recommendations may be updated based on patient status, additional functional criteria and insurance authorization.    Follow Up Recommendations  Acute inpatient rehab (3hours/day)    Assistance Recommended at Discharge Frequent or constant Supervision/Assistance  Patient can return home with the following  Two people to help with walking and/or transfers;Two people to help with bathing/dressing/bathroom;Assistance with cooking/housework;Assistance with feeding;Direct supervision/assist for medications management;Direct supervision/assist for financial management;Assist for transportation;Help with stairs or ramp for entrance   Equipment Recommendations  BSC/3in1    Recommendations for Other Services Rehab consult    Precautions / Restrictions Precautions Precautions: Fall Precaution Comments: L lat lean, SBP 130-150 Restrictions Weight Bearing Restrictions: No       Mobility Bed  Mobility Overal bed mobility: Needs Assistance Bed Mobility: Supine to Sit     Supine to sit: Mod assist, +2 for safety/equipment          Transfers Overall transfer level: Needs assistance Equipment used: 2 person hand held assist Transfers: Sit to/from Stand, Bed to chair/wheelchair/BSC Sit to Stand: Mod assist, +2 physical assistance           General transfer comment: mod A to assist in standing upright due to L lateral lean Transfer via Lift Equipment: Stedy   Balance Overall balance assessment: Needs assistance Sitting-balance support: Feet supported Sitting balance-Leahy Scale: Poor   Postural control: Left lateral lean   Standing balance-Leahy Scale: Zero Standing balance comment: +2 assist required                           ADL either performed or assessed with clinical judgement   ADL Overall ADL's : Needs assistance/impaired     Grooming: Minimal assistance;Sitting                   Toilet Transfer: Moderate assistance;+2 for physical assistance   Toileting- Clothing Manipulation and Hygiene: Total assistance Toileting - Clothing Manipulation Details (indicate cue type and reason): for pericare     Functional mobility during ADLs: Moderate assistance;+2 for physical assistance General ADL Comments: with use of Stedy    Extremity/Trunk Assessment Upper Extremity Assessment Upper Extremity Assessment: LUE deficits/detail LUE Deficits / Details: difficulty performing movement patterns 3/5 strength/ROM, able to complete functional reach task x6 in PNF/functional movement patterns LUE Sensation: decreased light touch;decreased proprioception LUE Coordination: decreased fine motor;decreased gross motor   Lower Extremity Assessment Lower Extremity Assessment: Defer to PT evaluation  Vision   Vision Assessment?: Yes Eye Alignment: Within Functional Limits Ocular Range of Motion: Within Functional Limits Tracking/Visual  Pursuits: Decreased smoothness of horizontal tracking;Decreased smoothness of vertical tracking Saccades: Additional head turns occurred during testing;Additional eye shifts occurred during testing Additional Comments: pt gazing to R side frequently during session   Perception     Praxis      Cognition Arousal/Alertness: Awake/alert Behavior During Therapy: Restless, Flat affect Overall Cognitive Status: Impaired/Different from baseline Area of Impairment: Attention, Safety/judgement, Awareness, Problem solving                   Current Attention Level: Sustained     Safety/Judgement: Decreased awareness of safety, Decreased awareness of deficits Awareness: Emergent Problem Solving: Slow processing, Decreased initiation, Difficulty sequencing, Requires verbal cues          Exercises Exercises: Other exercises Other Exercises Other Exercises: functional reach/grasp releast with RUE x6    Shoulder Instructions       General Comments VSS on RA    Pertinent Vitals/ Pain       Pain Assessment Pain Assessment: Faces Pain Score: 7  Faces Pain Scale: Hurts even more Pain Location: Restless leg syndrome Pain Descriptors / Indicators: Discomfort, Aching Pain Intervention(s): Limited activity within patient's tolerance, Monitored during session, Repositioned  Home Living                                          Prior Functioning/Environment              Frequency  Min 2X/week        Progress Toward Goals  OT Goals(current goals can now be found in the care plan section)  Progress towards OT goals: Progressing toward goals  Acute Rehab OT Goals Patient Stated Goal: none stated OT Goal Formulation: With patient/family Time For Goal Achievement: 04/27/22 Potential to Achieve Goals: Good ADL Goals Pt Will Perform Eating: with set-up;with supervision;sitting Pt Will Perform Grooming: with set-up;with supervision;sitting Pt Will  Perform Upper Body Bathing: with set-up;with supervision;sitting Pt Will Perform Lower Body Bathing: with min assist;sit to/from stand Pt Will Transfer to Toilet: with min assist;with +2 assist;bedside commode Additional ADL Goal #1: Maintain midline postural control EOB wtih minguard assistance in moderately distracting environment in preparation for ADL tasks  Plan Discharge plan remains appropriate;Frequency remains appropriate    Co-evaluation    PT/OT/SLP Co-Evaluation/Treatment: Yes Reason for Co-Treatment: To address functional/ADL transfers;For patient/therapist safety PT goals addressed during session: Mobility/safety with mobility;Proper use of DME;Balance;Strengthening/ROM OT goals addressed during session: ADL's and self-care;Strengthening/ROM      AM-PAC OT "6 Clicks" Daily Activity     Outcome Measure   Help from another person eating meals?: A Little Help from another person taking care of personal grooming?: A Lot Help from another person toileting, which includes using toliet, bedpan, or urinal?: Total Help from another person bathing (including washing, rinsing, drying)?: A Lot Help from another person to put on and taking off regular upper body clothing?: A Lot Help from another person to put on and taking off regular lower body clothing?: Total 6 Click Score: 11    End of Session Equipment Utilized During Treatment: Gait belt  OT Visit Diagnosis: Unsteadiness on feet (R26.81);Other abnormalities of gait and mobility (R26.89);Muscle weakness (generalized) (M62.81);Low vision, both eyes (H54.2);Other symptoms and signs involving the nervous system (R29.898);Other symptoms and signs involving  cognitive function;Cognitive communication deficit (R41.841);Dizziness and giddiness (R42);Hemiplegia and hemiparesis;Pain Symptoms and signs involving cognitive functions: Nontraumatic intracerebral hemorrhage Hemiplegia - Right/Left: Left Hemiplegia - dominant/non-dominant:  Non-Dominant Hemiplegia - caused by: Other Nontraumatic intracranial hemorrhage   Activity Tolerance Patient tolerated treatment well   Patient Left in chair;with call bell/phone within reach;with chair alarm set   Nurse Communication Mobility status        Time: 1610-9604 OT Time Calculation (min): 27 min  Charges: OT General Charges $OT Visit: 1 Visit OT Treatments $Self Care/Home Management : 8-22 mins  Alfonzo Beers, OTD, OTR/L Acute Rehab [phone]   Mayer Masker 04/15/2022, 12:30 PM

## 2022-04-15 NOTE — NC FL2 (Signed)
[place] MEDICAID FL2 LEVEL OF CARE SCREENING TOOL     IDENTIFICATION  Patient Name: Jake Bartlett Birthdate: Sep 24, 1941 Sex: male Admission Date (Current Location): 04/12/2022  [facility] and [place] Number:  Producer, television/film/video and Address:  [facility], [address] [place], [place] [address]      Provider Number: [id number]  Attending Physician Name and Address:  Cipriano Bunker, MD  Relative Name and Phone Number:       Current Level of Care: Hospital Recommended Level of Care: Skilled Nursing Facility Prior Approval Number:    Date Approved/Denied:   PASRR Number: [id number]  Discharge Plan: SNF    Current Diagnoses: Patient Active Problem List   Diagnosis Date Noted   ICH (intracerebral hemorrhage) (HCC) 04/12/2022   Pressure injury of skin 04/12/2022    Orientation RESPIRATION BLADDER Height & Weight     Self, Time, Situation, Place  Normal Incontinent Weight: 186 lb 15.2 oz (84.8 kg) Height:  5\' 9"  (175.3 cm)  BEHAVIORAL SYMPTOMS/MOOD NEUROLOGICAL BOWEL NUTRITION STATUS      Continent Diet (carb modified)  AMBULATORY STATUS COMMUNICATION OF NEEDS Skin   Extensive Assist Verbally PU Stage and Appropriate Care PU Stage 1 Dressing:  (coccyx, foam dressing: lift every shift to assess, change every 3 days)                     Personal Care Assistance Level of Assistance  Bathing, Feeding, Dressing Bathing Assistance: Maximum assistance Feeding assistance: Limited assistance Dressing Assistance: Maximum assistance     Functional Limitations Info  Sight Sight Info: Impaired        SPECIAL CARE FACTORS FREQUENCY  PT (By licensed PT), OT (By licensed OT)     PT Frequency: 5x/wk OT Frequency: 5x/wk            Contractures Contractures Info: Not present    Additional Factors Info  Code Status, Allergies, Psychotropic, Insulin Sliding Scale Code Status Info: Full Allergies Info: Neurontin (Gabapentin), Requip  (Ropinirole) Psychotropic Info: Lexapro 20mg  daily Insulin Sliding Scale Info: see DC summary       Current Medications (04/15/2022):  This is the current hospital active medication list Current Facility-Administered Medications  Medication Dose Route Frequency Provider Last Rate Last Admin   acetaminophen (TYLENOL) tablet 650 mg  650 mg Oral Q4H PRN Jefferson Fuel, MD   650 mg at 04/14/22 1710   Or   acetaminophen (TYLENOL) 160 MG/5ML solution 650 mg  650 mg Per Tube Q4H PRN Jefferson Fuel, MD       Or   acetaminophen (TYLENOL) suppository 650 mg  650 mg Rectal Q4H PRN Jefferson Fuel, MD       amLODipine (NORVASC) tablet 5 mg  5 mg Oral Daily Marvel Plan, MD   5 mg at 04/15/22 0935   carbidopa-levodopa (SINEMET IR) 25-250 MG per tablet immediate release 1 tablet  1 tablet Oral BID Erick Blinks, MD   1 tablet at 04/15/22 0935   Chlorhexidine Gluconate Cloth 2 % PADS 6 each  6 each Topical Daily Erick Blinks, MD   6 each at 04/14/22 0931   escitalopram (LEXAPRO) tablet 20 mg  20 mg Oral Daily Marvel Plan, MD   20 mg at 04/15/22 0935   folic acid (FOLVITE) tablet 1 mg  1 mg Oral Daily Marvel Plan, MD   1 mg at 04/15/22 0935   heparin injection 5,000 Units  5,000 Units Subcutaneous Q8H Marvel Plan,  MD   5,000 Units at 04/15/22 0607   insulin aspart (novoLOG) injection 0-15 Units  0-15 Units Subcutaneous TID WC Erick Blinks, MD   3 Units at 04/15/22 1205   insulin glargine-yfgn (SEMGLEE) injection 12 Units  12 Units Subcutaneous Daily Cipriano Bunker, MD   12 Units at 04/15/22 1105   melatonin tablet 10 mg  10 mg Oral QHS PRN Carollee Herter, DO   10 mg at 04/15/22 0112   multivitamin with minerals tablet 1 tablet  1 tablet Oral Daily Marvel Plan, MD   1 tablet at 04/15/22 0935   pantoprazole (PROTONIX) EC tablet 40 mg  40 mg Oral Daily Marvel Plan, MD   40 mg at 04/15/22 0935   rosuvastatin (CRESTOR) tablet 10 mg  10 mg Oral QHS Marvel Plan, MD   10 mg at 04/14/22 2140    senna-docusate (Senokot-S) tablet 1 tablet  1 tablet Oral BID Jefferson Fuel, MD   1 tablet at 04/15/22 0935   tamsulosin (FLOMAX) capsule 0.4 mg  0.4 mg Oral Daily Marvel Plan, MD   0.4 mg at 04/15/22 1610     Discharge Medications: Please see discharge summary for a list of discharge medications.  Relevant Imaging Results:  Relevant Lab Results:   Additional Information SS#: [id number]  Baldemar Lenis, LCSW

## 2022-04-15 NOTE — Progress Notes (Signed)
Physical Therapy Treatment Patient Details Name: Jake Bartlett MRN: [id number] DOB: 14-Dec-1940 Today's Date: 04/15/2022   History of Present Illness Pt is an 81 y/o male presenting 04/12/22 with acute onset weakness of the left arm and leg as well as numbness of the left leg.  On CT, he was found to have a small right thalamic ICH. PMH: DM, kidney stones, right hip replacement, anxiety, HTN, CKD3, and Parkinson's disease    PT Comments    Progressing towards physical therapy goals. Pt motivated to work with therapies and with good rehab effort. Pt demonstrated improved sitting and standing balance, however still requring +2 assist for OOB to chair. Continue to feel AIR is the most appropriate discharge disposition, to maximize functional independence and safety prior to return home with family support. Will continue to follow.    Recommendations for follow up therapy are one component of a multi-disciplinary discharge planning process, led by the attending physician.  Recommendations may be updated based on patient status, additional functional criteria and insurance authorization.  Follow Up Recommendations  Acute inpatient rehab (3hours/day)     Assistance Recommended at Discharge Frequent or constant Supervision/Assistance  Patient can return home with the following Two people to help with walking and/or transfers;Two people to help with bathing/dressing/bathroom;Assistance with cooking/housework;Direct supervision/assist for medications management;Assist for transportation;Direct supervision/assist for financial management;Help with stairs or ramp for entrance   Equipment Recommendations  Other (comment) (defer to post acute)    Recommendations for Other Services Rehab consult     Precautions / Restrictions Precautions Precautions: Fall Precaution Comments: L lat lean, SBP 130-150 Restrictions Weight Bearing Restrictions: No     Mobility  Bed Mobility Overal bed mobility:  Needs Assistance Bed Mobility: Supine to Sit     Supine to sit: Mod assist, +2 for safety/equipment     General bed mobility comments: Hand over hand assist to reach for L railing with RUE. Bed pad utilized for transition to EOB and assist for trunk elevation to full sitting position.    Transfers Overall transfer level: Needs assistance   Transfers: Sit to/from Stand, Bed to chair/wheelchair/BSC Sit to Stand: Mod assist, +2 physical assistance           General transfer comment: +2 assist for power up to full stand. Initially pt able to maintain midline fairly well but as pt fatigued, noted L lateral lean worsening. Pt with several sit<>stands throughout session to get to toilet, with 5x sit<>stand in [place] prior to positioning in the recliner. Transfer via Lift Equipment: Stedy  Ambulation/Gait               General Gait Details: Unable to progress to gait training this session.   Stairs             Wheelchair Mobility    Modified Rankin (Stroke Patients Only)       Balance Overall balance assessment: Needs assistance Sitting-balance support: Feet supported Sitting balance-Leahy Scale: Poor   Postural control: Left lateral lean   Standing balance-Leahy Scale: Zero Standing balance comment: +2 assist required                            Cognition Arousal/Alertness: Awake/alert Behavior During Therapy: Restless, Flat affect Overall Cognitive Status: Impaired/Different from baseline Area of Impairment: Attention, Safety/judgement, Awareness, Problem solving                   Current Attention Level: Sustained  Safety/Judgement: Decreased awareness of safety, Decreased awareness of deficits Awareness: Emergent Problem Solving: Slow processing, Decreased initiation, Difficulty sequencing, Requires verbal cues          Exercises Other Exercises Other Exercises: 5x sit to stand from [facility] Other Exercises: reaching activity  with LUE    General Comments        Pertinent Vitals/Pain Pain Assessment Pain Assessment: Faces Faces Pain Scale: Hurts a little bit Pain Location: Restless leg syndrome (eye discomfort) Pain Descriptors / Indicators: Discomfort, Aching Pain Intervention(s): Limited activity within patient's tolerance, Monitored during session, Repositioned    Home Living                          Prior Function            PT Goals (current goals can now be found in the care plan section) Acute Rehab PT Goals Patient Stated Goal: return home when able PT Goal Formulation: With patient Time For Goal Achievement: 04/27/22 Potential to Achieve Goals: Good Progress towards PT goals: Progressing toward goals    Frequency    Min 4X/week      PT Plan Current plan remains appropriate    Co-evaluation PT/OT/SLP Co-Evaluation/Treatment: Yes Reason for Co-Treatment: Necessary to address cognition/behavior during functional activity;For patient/therapist safety;To address functional/ADL transfers PT goals addressed during session: Mobility/safety with mobility;Proper use of DME;Balance;Strengthening/ROM        AM-PAC PT "6 Clicks" Mobility   Outcome Measure  Help needed turning from your back to your side while in a flat bed without using bedrails?: A Lot Help needed moving from lying on your back to sitting on the side of a flat bed without using bedrails?: A Lot Help needed moving to and from a bed to a chair (including a wheelchair)?: Total Help needed standing up from a chair using your arms (e.g., wheelchair or bedside chair)?: A Lot Help needed to walk in hospital room?: Total Help needed climbing 3-5 steps with a railing? : Total 6 Click Score: 9    End of Session Equipment Utilized During Treatment: Gait belt Activity Tolerance: Patient tolerated treatment well;Patient limited by lethargy Patient left: in chair;with call bell/phone within reach;with chair alarm  set;with family/visitor present Nurse Communication: Mobility status PT Visit Diagnosis: Muscle weakness (generalized) (M62.81);Other abnormalities of gait and mobility (R26.89);Hemiplegia and hemiparesis Hemiplegia - Right/Left: Left Hemiplegia - dominant/non-dominant: Non-dominant Hemiplegia - caused by: Nontraumatic intracerebral hemorrhage     Time: 1914-7829 PT Time Calculation (min) (ACUTE ONLY): 25 min  Charges:  $Therapeutic Activity: 8-22 mins                     Conni Slipper, PT, DPT Acute Rehabilitation Services Secure Chat Preferred Office: [phone]    Marylynn Pearson 04/15/2022, 11:14 AM

## 2022-04-16 DIAGNOSIS — I610 Nontraumatic intracerebral hemorrhage in hemisphere, subcortical: Secondary | ICD-10-CM | POA: Diagnosis not present

## 2022-04-16 LAB — CBC
HCT: 24.3 % — ABNORMAL LOW (ref 39.0–52.0)
Hemoglobin: 8.1 g/dL — ABNORMAL LOW (ref 13.0–17.0)
MCH: 32.4 pg (ref 26.0–34.0)
MCHC: 33.3 g/dL (ref 30.0–36.0)
MCV: 97.2 fL (ref 80.0–100.0)
Platelets: 266 10*3/uL (ref 150–400)
RBC: 2.5 MIL/uL — ABNORMAL LOW (ref 4.22–5.81)
RDW: 13.4 % (ref 11.5–15.5)
WBC: 5.4 10*3/uL (ref 4.0–10.5)
nRBC: 0 % (ref 0.0–0.2)

## 2022-04-16 LAB — BASIC METABOLIC PANEL
Anion gap: 8 (ref 5–15)
BUN: 38 mg/dL — ABNORMAL HIGH (ref 8–23)
CO2: 18 mmol/L — ABNORMAL LOW (ref 22–32)
Calcium: 8.5 mg/dL — ABNORMAL LOW (ref 8.9–10.3)
Chloride: 109 mmol/L (ref 98–111)
Creatinine, Ser: 2.94 mg/dL — ABNORMAL HIGH (ref 0.61–1.24)
GFR, Estimated: 21 mL/min — ABNORMAL LOW (ref >60)
Glucose, Bld: 199 mg/dL — ABNORMAL HIGH (ref 70–99)
Potassium: 4.1 mmol/L (ref 3.5–5.1)
Sodium: 135 mmol/L (ref 135–145)

## 2022-04-16 LAB — GLUCOSE, CAPILLARY
Glucose-Capillary: 179 mg/dL — ABNORMAL HIGH (ref 70–99)
Glucose-Capillary: 169 mg/dL — ABNORMAL HIGH (ref 70–99)
Glucose-Capillary: 158 mg/dL — ABNORMAL HIGH (ref 70–99)

## 2022-04-16 LAB — MAGNESIUM: Magnesium: 2.1 mg/dL (ref 1.7–2.4)

## 2022-04-16 LAB — PHOSPHORUS: Phosphorus: 5 mg/dL — ABNORMAL HIGH (ref 2.5–4.6)

## 2022-04-16 MED ORDER — PRAMIPEXOLE DIHYDROCHLORIDE 0.125 MG PO TABS: 0.1250 mg | ORAL_TABLET | Freq: Three times a day (TID) | ORAL | 0 refills | Status: DC

## 2022-04-16 MED ORDER — OXYCODONE HCL 5 MG PO TABS: 5.0000 mg | ORAL_TABLET | Freq: Three times a day (TID) | ORAL | Status: DC | PRN

## 2022-04-16 NOTE — TOC Initial Note (Signed)
Transition of Care [facility]) - Initial/Assessment Note    Patient Details  Name: Jake Bartlett MRN: [id number] Date of Birth: 22-Jun-1941  Transition [facility]) CM/SW Contact:    Baldemar Lenis, LCSW Phone Number: 04/16/2022, 2:14 PM  Clinical Narrative:         CSW met with patient and spouse to discuss SNF recommendation and discuss SNF options, and family chose [facility]. CSW confirmed with [facility] bed availability, and patient can admit tomorrow. CSW to follow.          Expected Discharge Plan: Skilled Nursing Facility Barriers to Discharge: Continued Medical Work up   Patient Goals and CMS Choice Patient states their goals for this hospitalization and ongoing recovery are:: to get rehab CMS Medicare.gov Compare Post Acute Care list provided to:: Patient Represenative (must comment) Choice offered to / list presented to : Spouse  Expected Discharge Plan and Services Expected Discharge Plan: Skilled Nursing Facility     Post Acute Care Choice: Skilled Nursing Facility Living arrangements for the past 2 months: Single Family Home Expected Discharge Date: 04/16/22                                    Prior Living Arrangements/Services Living arrangements for the past 2 months: Single Family Home Lives with:: Spouse Patient language and need for interpreter reviewed:: No Do you feel safe going back to the place where you live?: Yes      Need for Family Participation in Patient Care: No (Comment) Care giver support system in place?: No (comment)   Criminal Activity/Legal Involvement Pertinent to Current Situation/Hospitalization: No - Comment as needed  Activities of Daily Living Home Assistive Devices/Equipment: Cane (specify quad or straight) ADL Screening (condition at time of admission) Patient's cognitive ability adequate to safely complete daily activities?: Yes Is the patient deaf or have difficulty hearing?: No Does the patient have difficulty seeing, even  when wearing glasses/contacts?: Yes Does the patient have difficulty concentrating, remembering, or making decisions?: No Patient able to express need for assistance with ADLs?: Yes Does the patient have difficulty dressing or bathing?: No Independently performs ADLs?: Yes (appropriate for developmental age) Does the patient have difficulty walking or climbing stairs?: No Weakness of Legs: Left Weakness of Arms/Hands: Left  Permission Sought/Granted Permission sought to share information with : Facility Medical sales representative, Family Supports Permission granted to share information with : Yes, Verbal Permission Granted  Share Information with NAME: [name]  Permission granted to share info w AGENCY: SNF  Permission granted to share info w Relationship: Spouse     Emotional Assessment Appearance:: Appears stated age Attitude/Demeanor/Rapport: Engaged Affect (typically observed): Appropriate Orientation: : Oriented to Self, Oriented to Place, Oriented to  Time, Oriented to Situation Alcohol / Substance Use: Not Applicable Psych Involvement: No (comment)  Admission diagnosis:  ICH (intracerebral hemorrhage) (HCC) [I61.9] Patient Active Problem List   Diagnosis Date Noted   ICH (intracerebral hemorrhage) (HCC) 04/12/2022   Pressure injury of skin 04/12/2022   PCP:  Ardyth Gal, MD Pharmacy:   [facility] [phone] - [place], [place] - [address] AT [facility] OF [address] [place] [address] Phone: [phone] Fax: [phone]  [facility] #[id number], [place] - [address] AT [facility] [address] [place] [address] Phone: [phone] Fax: [phone]     Social  Determinants of Health (SDOH) Interventions    Readmission Risk Interventions     No data to display

## 2022-12-15 NOTE — Progress Notes (Signed)
 GASTROENTEROLOGY INPATIENT PROGRESS NOTE  Requesting Attending Physician:  Jennette Cass, MD  Reason for Followup:   Normocytic anemia without gross bleeding  Subjective & History:      - No signs for gross bleeding such as melena or hematochezia  Vital Signs: Temp:  [97.6 F (36.4 C)-99 F (37.2 C)] 98.6 F (37 C) Pulse:  [57-84] 72 Resp:  [19-24] 20 BP: (141-184)/(45-73) 184/69 SpO2:  [95 %-97 %] 95 %   Physical Exam:  Not done   Labs:  WBC/Hgb/Hct/Plt/ANC:  6.8/8.4*/24.8*/347/4.5 (02/24 2344) AlkPhos/AST/ALT/BiliTot: 131*/13/3*/0.6 (02/24 2344)      Assessment:  1. Anemia which may be explained by chronic kidney disease.  Obvious gross GI bleeding is not present.   Plan:  Given the non-urgency of case and potential cause of less probable for a GI pathology, EGD-ileocolonoscopy can be done on Tuesday if patient is planning to remain in hospital until then.  We will return tomorrow for reassessment.    Thank you for allowing us  to participate in the care of this patient  Gunnar HILARIO Daniels, MD    Electronically signed by: Daniels Gunnar Leeks, MD 12/15/22 [phone]

## 2022-12-16 NOTE — Progress Notes (Signed)
 GASTROENTEROLOGY INPATIENT PROGRESS NOTE  Requesting Attending Physician:  Jennette Cass, MD  Reason for Followup:  Normocytic anemia without gross bleeding  Subjective & History:      Agrees to having EGD-ileocolonoscopy as to evaluate for potential IDA due to blood loss  Vital Signs: Temp:  [97.6 F (36.4 C)-98.6 F (37 C)] 98.6 F (37 C) Pulse:  [60-73] 64 Resp:  [20] 20 BP: (122-151)/(60-80) 130/60 SpO2:  [93 %-98 %] 95 %   Physical Exam:  Not done   Labs:  WBC/Hgb/Hct/Plt/ANC:  6.7/8.8*/25.8*/370/-- (02/26 0406)      Assessment:  1. Normocytic anemia.  Likely due to chronic renal insufficiency.  A component of GI blood loss (IDA) cannot be excluded.   Plan:  1. Work up for IDA with EGD-ileocolonoscopy tomorrow 2. Further plan pending result    Thank you for allowing us  to participate in the care of this patient  Gunnar HILARIO Daniels, MD    Electronically signed by: Daniels Gunnar Leeks, MD 12/16/22 [phone]

## 2023-03-07 ENCOUNTER — Encounter (HOSPITAL_BASED_OUTPATIENT_CLINIC_OR_DEPARTMENT_OTHER): Payer: Self-pay | Admitting: Emergency Medicine

## 2023-03-07 ENCOUNTER — Other Ambulatory Visit: Payer: Self-pay

## 2023-03-07 ENCOUNTER — Emergency Department (HOSPITAL_BASED_OUTPATIENT_CLINIC_OR_DEPARTMENT_OTHER)
Admission: EM | Admit: 2023-03-07 | Discharge: 2023-03-07 | Disposition: A | Discharge status: Home / Self Care | Payer: Medicare Other | Attending: Emergency Medicine | Admitting: Emergency Medicine

## 2023-03-07 DIAGNOSIS — I959 Hypotension, unspecified: Secondary | ICD-10-CM | POA: Insufficient documentation

## 2023-03-07 DIAGNOSIS — R600 Localized edema: Secondary | ICD-10-CM | POA: Insufficient documentation

## 2023-03-07 DIAGNOSIS — R001 Bradycardia, unspecified: Secondary | ICD-10-CM | POA: Insufficient documentation

## 2023-03-07 DIAGNOSIS — N3000 Acute cystitis without hematuria: Secondary | ICD-10-CM | POA: Insufficient documentation

## 2023-03-07 DIAGNOSIS — Z794 Long term (current) use of insulin: Secondary | ICD-10-CM | POA: Diagnosis not present

## 2023-03-07 DIAGNOSIS — R300 Dysuria: Secondary | ICD-10-CM | POA: Diagnosis present

## 2023-03-07 DIAGNOSIS — E119 Type 2 diabetes mellitus without complications: Secondary | ICD-10-CM | POA: Diagnosis not present

## 2023-03-07 DIAGNOSIS — Z79899 Other long term (current) drug therapy: Secondary | ICD-10-CM | POA: Diagnosis not present

## 2023-03-07 LAB — CBC WITH DIFFERENTIAL/PLATELET
Abs Immature Granulocytes: 0.03 10*3/uL (ref 0.00–0.07)
Basophils Absolute: 0 10*3/uL (ref 0.0–0.1)
Basophils Relative: 1 %
Eosinophils Absolute: 0.3 10*3/uL (ref 0.0–0.5)
Eosinophils Relative: 5 %
HCT: 31.2 % — ABNORMAL LOW (ref 39.0–52.0)
Hemoglobin: 9.9 g/dL — ABNORMAL LOW (ref 13.0–17.0)
Immature Granulocytes: 1 %
Lymphocytes Relative: 23 %
Lymphs Abs: 1.3 10*3/uL (ref 0.7–4.0)
MCH: 31.3 pg (ref 26.0–34.0)
MCHC: 31.7 g/dL (ref 30.0–36.0)
MCV: 98.7 fL (ref 80.0–100.0)
Monocytes Absolute: 0.6 10*3/uL (ref 0.1–1.0)
Monocytes Relative: 10 %
Neutro Abs: 3.6 10*3/uL (ref 1.7–7.7)
Neutrophils Relative %: 60 %
Platelets: 315 10*3/uL (ref 150–400)
RBC: 3.16 MIL/uL — ABNORMAL LOW (ref 4.22–5.81)
RDW: 15.8 % — ABNORMAL HIGH (ref 11.5–15.5)
WBC: 5.8 10*3/uL (ref 4.0–10.5)
nRBC: 0 % (ref 0.0–0.2)

## 2023-03-07 LAB — URINALYSIS, W/ REFLEX TO CULTURE (INFECTION SUSPECTED)
Bilirubin Urine: NEGATIVE
Glucose, UA: 100 mg/dL — AB
Ketones, ur: NEGATIVE mg/dL
Nitrite: NEGATIVE
Protein, ur: 100 mg/dL — AB
Specific Gravity, Urine: 1.025 (ref 1.005–1.030)
pH: 5.5 (ref 5.0–8.0)

## 2023-03-07 LAB — COMPREHENSIVE METABOLIC PANEL
ALT: 8 U/L (ref 0–44)
AST: 17 U/L (ref 15–41)
Albumin: 3.2 g/dL — ABNORMAL LOW (ref 3.5–5.0)
Alkaline Phosphatase: 121 U/L (ref 38–126)
Anion gap: 7 (ref 5–15)
BUN: 40 mg/dL — ABNORMAL HIGH (ref 8–23)
CO2: 19 mmol/L — ABNORMAL LOW (ref 22–32)
Calcium: 8 mg/dL — ABNORMAL LOW (ref 8.9–10.3)
Chloride: 107 mmol/L (ref 98–111)
Creatinine, Ser: 2.82 mg/dL — ABNORMAL HIGH (ref 0.61–1.24)
GFR, Estimated: 22 mL/min — ABNORMAL LOW (ref >60)
Glucose, Bld: 239 mg/dL — ABNORMAL HIGH (ref 70–99)
Potassium: 4.7 mmol/L (ref 3.5–5.1)
Sodium: 133 mmol/L — ABNORMAL LOW (ref 135–145)
Total Bilirubin: 0.3 mg/dL (ref 0.3–1.2)
Total Protein: 6.7 g/dL (ref 6.5–8.1)

## 2023-03-07 LAB — CBG MONITORING, ED: Glucose-Capillary: 202 mg/dL — ABNORMAL HIGH (ref 70–99)

## 2023-03-07 LAB — LACTIC ACID, PLASMA: Lactic Acid, Venous: 1.6 mmol/L (ref 0.5–1.9)

## 2023-03-07 MED ORDER — CEFPODOXIME PROXETIL 200 MG PO TABS: 200.0000 mg | ORAL_TABLET | Freq: Two times a day (BID) | ORAL | 0 refills | Status: DC

## 2023-03-07 MED ORDER — SODIUM CHLORIDE 0.9 % IV BOLUS
1000.0000 mL | Freq: Once | INTRAVENOUS | Status: AC
  Administered 2023-03-07: 1000 mL via INTRAVENOUS

## 2023-03-07 MED ORDER — SODIUM CHLORIDE 0.9 % IV SOLN
1.0000 g | Freq: Once | INTRAVENOUS | Status: AC
  Administered 2023-03-07: 1 g via INTRAVENOUS
  Filled 2023-03-07: qty 10

## 2023-03-07 NOTE — Discharge Instructions (Addendum)
Watch for worsening signs of infection.  The culture should result in a couple days.  Follow-up with your doctor.

## 2023-03-07 NOTE — ED Provider Notes (Signed)
[facility] [place] Provider Note   CSN: [id number] Arrival date & time: 03/07/23  1721     History  Chief Complaint  Patient presents with   Dysuria    Jake Bartlett is a 82 y.o. male.   Dysuria Presenting symptoms: dysuria   Patient presents with dysuria.  Recently had been in hospital at [facility] for urosepsis.  Discharged on 3 weeks ago.  For the last 2 to 3 days has been having dysuria again.  Reportedly blood sugar was low and has been eating little less at home.  No fevers.  Also has had lower blood pressure today.  Went to urgent care and was sent here.  Reviewed note from urgent care.  Reportedly had CT scan on the hospital that also potentially showed a colon cancer.    Past Medical History:  Diagnosis Date   Diabetes mellitus without complication (HCC)    High cholesterol    Stroke (HCC)     Home Medications Prior to Admission medications   Medication Sig Start Date End Date Taking? Authorizing Provider  cefpodoxime (VANTIN) 200 MG tablet Take 1 tablet (200 mg total) by mouth 2 (two) times daily. 03/07/23  Yes Benjiman Core, MD  acetaminophen (TYLENOL) 500 MG tablet Take 500 mg by mouth daily as needed for mild pain.    [provider]  amLODipine (NORVASC) 5 MG tablet Take 5 mg by mouth daily. 01/16/22   [provider]  carbidopa-levodopa (SINEMET IR) 25-250 MG tablet Take 1 tablet by mouth 2 (two) times daily. 01/09/22   [provider]  escitalopram (LEXAPRO) 20 MG tablet Take 20 mg by mouth daily. 04/10/22   [provider]  folic acid (FOLVITE) 1 MG tablet Take 1 mg by mouth daily. 11/23/21   [provider]  insulin NPH-regular Human (70-30) 100 UNIT/ML injection Inject 30 Units into the skin daily.    [provider]  Multiple Vitamin (MULTIVITAMIN) tablet Take 1 tablet by mouth daily.    [provider]  pramipexole (MIRAPEX) 0.125 MG tablet Take 1 tablet  (0.125 mg total) by mouth 3 (three) times daily. 04/16/22   Willeen Niece, MD  rosuvastatin (CRESTOR) 10 MG tablet Take 10 mg by mouth at bedtime. 01/16/22   [provider]  tamsulosin (FLOMAX) 0.4 MG CAPS capsule Take 1 capsule (0.4 mg total) by mouth daily. 02/24/22   Terrilee Files, MD      Allergies    Gabapentin and Ropinirole    Review of Systems   Review of Systems  Genitourinary:  Positive for dysuria.    Physical Exam Updated Vital Signs BP (!) 135/107   Pulse 68   Temp 98 F (36.7 C)   Resp (!) 28   Ht 5\' 8"  (1.727 m)   Wt 90.1 kg   SpO2 97%   BMI 30.20 kg/m  Physical Exam Vitals reviewed.  HENT:     Head: Normocephalic.  Cardiovascular:     Rate and Rhythm: Regular rhythm. Bradycardia present.  Pulmonary:     Breath sounds: No wheezing.  Abdominal:     Tenderness: There is no abdominal tenderness.  Musculoskeletal:        General: No tenderness.     Cervical back: Neck supple.     Right lower leg: Edema present.     Left lower leg: Edema present.     Comments: Slight edema bilateral lower legs.  Skin:    Capillary Refill: Capillary  refill takes less than 2 seconds.  Neurological:     Mental Status: He is alert and oriented to person, place, and time.     ED Results / Procedures / Treatments   Labs (all labs ordered are listed, but only abnormal results are displayed) Labs Reviewed  COMPREHENSIVE METABOLIC PANEL - Abnormal; Notable for the following components:      Result Value   Sodium 133 (*)    CO2 19 (*)    Glucose, Bld 239 (*)    BUN 40 (*)    Creatinine, Ser 2.82 (*)    Calcium 8.0 (*)    Albumin 3.2 (*)    GFR, Estimated 22 (*)    All other components within normal limits  CBC WITH DIFFERENTIAL/PLATELET - Abnormal; Notable for the following components:   RBC 3.16 (*)    Hemoglobin 9.9 (*)    HCT 31.2 (*)    RDW 15.8 (*)    All other components within normal limits  URINALYSIS, W/ REFLEX TO CULTURE (INFECTION SUSPECTED)  - Abnormal; Notable for the following components:   APPearance CLOUDY (*)    Glucose, UA 100 (*)    Hgb urine dipstick MODERATE (*)    Protein, ur 100 (*)    Leukocytes,Ua MODERATE (*)    Bacteria, UA MANY (*)    All other components within normal limits  CBG MONITORING, ED - Abnormal; Notable for the following components:   Glucose-Capillary 202 (*)    All other components within normal limits  CULTURE, BLOOD (ROUTINE X 2)  CULTURE, BLOOD (ROUTINE X 2)  URINE CULTURE  LACTIC ACID, PLASMA    EKG None  Radiology No results found.  Procedures Procedures    Medications Ordered in ED Medications  sodium chloride 0.9 % bolus 1,000 mL (0 mLs Intravenous Stopped 03/07/23 2220)  cefTRIAXone (ROCEPHIN) 1 g in sodium chloride 0.9 % 100 mL IVPB (0 g Intravenous Stopped 03/07/23 2229)    ED Course/ Medical Decision Making/ A&P                             Medical Decision Making Amount and/or Complexity of Data Reviewed Labs: ordered.  Risk Prescription drug management.   Patient with dysuria.  Recent urosepsis.  Mild hypotension.  Worry for infection.  Will get fluid bolus.  Will get basic blood work urinalysis blood cultures and lactate.  Had dipstick urine at urgent care that potentially did show infection.  Not currently on antibiotics.  Patient had fluids given here.  Blood work overall reassuring.  White count reassuring and creatinine at baseline.  Lactic acid not elevated.  Urine does show likely infection.  Patient feels better after fluids and is eager to go home.  Urine culture sent and I discussed with pharmacist about antibiotic choice.  Will give cefpodoxime and culture can be followed.  Does not appear to be septic at this time and can be managed outpatient.  Will discharge.        Final Clinical Impression(s) / ED Diagnoses Final diagnoses:  Acute cystitis without hematuria    Rx / DC Orders ED Discharge Orders          Ordered    cefpodoxime (VANTIN)  200 MG tablet  2 times daily        03/07/23 2257              Benjiman Core, MD 03/07/23 2327

## 2023-03-07 NOTE — ED Triage Notes (Signed)
Pt was discharged from the hospital and treated for UTI.  Pt states the symptoms are returning.  Pt also c/o low blood sugar today due to not eating.  CBG now is 202.  No known fever.  Family states his bp was lower today.

## 2023-03-08 LAB — CULTURE, BLOOD (ROUTINE X 2): Special Requests: ADEQUATE

## 2023-03-10 LAB — CULTURE, BLOOD (ROUTINE X 2): Culture: NO GROWTH

## 2023-03-10 LAB — URINE CULTURE: Culture: >=100000 — AB

## 2023-03-11 ENCOUNTER — Telehealth (HOSPITAL_BASED_OUTPATIENT_CLINIC_OR_DEPARTMENT_OTHER): Payer: Self-pay | Admitting: *Deleted

## 2023-03-11 NOTE — Telephone Encounter (Signed)
Post ED Visit - Positive Culture Follow-up  Culture report reviewed by antimicrobial stewardship pharmacist: [facility] [x]  [facility] Pharm.D. []  Celedonio Miyamoto, Pharm.D., BCPS AQ-ID []  Garvin Fila, Pharm.D., BCPS []  Georgina Pillion, Pharm.D., BCPS []  [place], [address].D., BCPS, AAHIVP []  Estella Husk, Pharm.D., BCPS, AAHIVP []  Lysle Pearl, PharmD, BCPS []  Phillips Climes, PharmD, BCPS []  Agapito Games, PharmD, BCPS []  Verlan Friends, PharmD []  Mervyn Gay, PharmD, BCPS []  Vinnie Level, PharmD  [facility] []  Len Childs, PharmD []  Greer Pickerel, PharmD []  Adalberto Cole, PharmD []  Perlie Gold, Rph []  Lonell Face) Jean Rosenthal, PharmD []  Earl Many, PharmD []  Junita Push, PharmD []  Dorna Leitz, PharmD []  Terrilee Files, PharmD []  Lynann Beaver, PharmD []  Keturah Barre, PharmD []  Loralee Pacas, PharmD []  Bernadene Person, PharmD   Positive urine culture Treated with Cefpodoxime, organism sensitive to the same and no further patient follow-up is required at this time.  Bing Quarry 03/11/2023, 10:37 AM

## 2023-03-12 LAB — CULTURE, BLOOD (ROUTINE X 2): Culture: NO GROWTH

## 2023-12-07 IMAGING — CT CT HEAD CODE STROKE
2 of 3 series · 14 of 47 positions shown, 17 images · non-contrast
Comparison: [id number]

CLINICAL DATA: Code stroke.  Neuro deficit, acute, stroke suspected



[Series 3: head 5.0 st · axial · 0.44mm/px · z∈[-98,+37]mm · 11 of 33 slices shown, 14 images]
[im 3/33  brain]
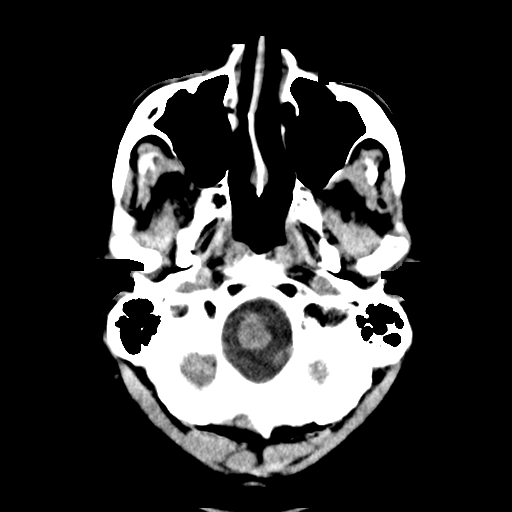
[im 3/33  bone]
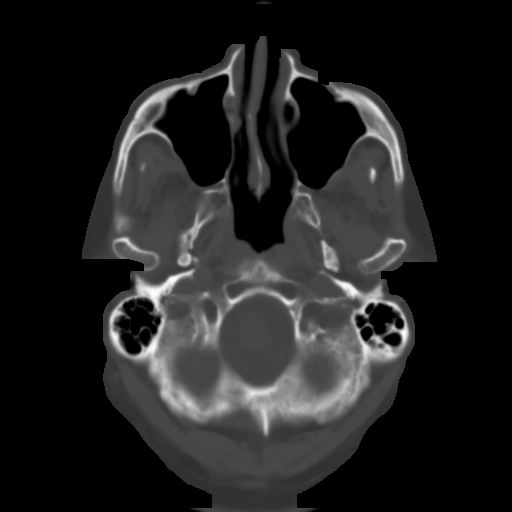
[im 5/33  brain]
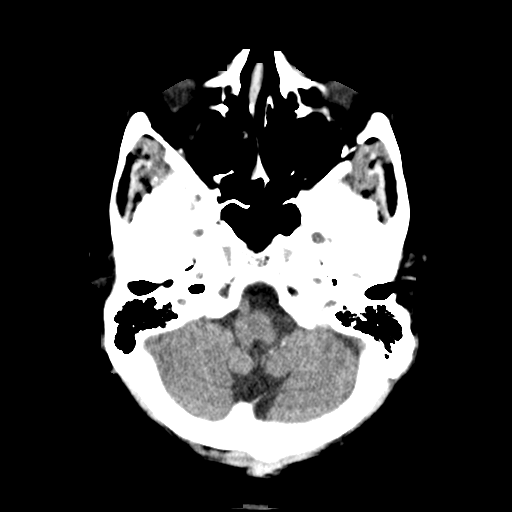
[im 8/33  brain]
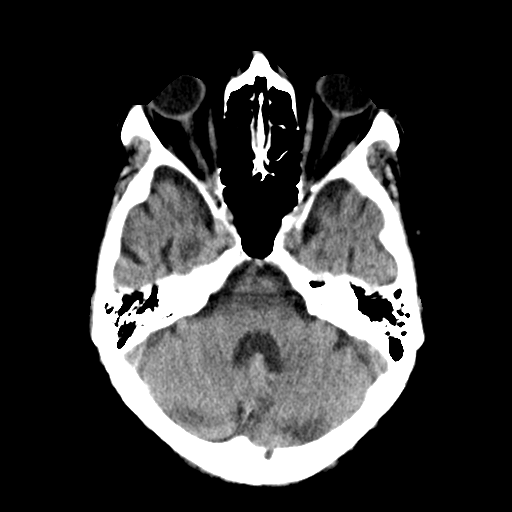
[im 10/33  brain]
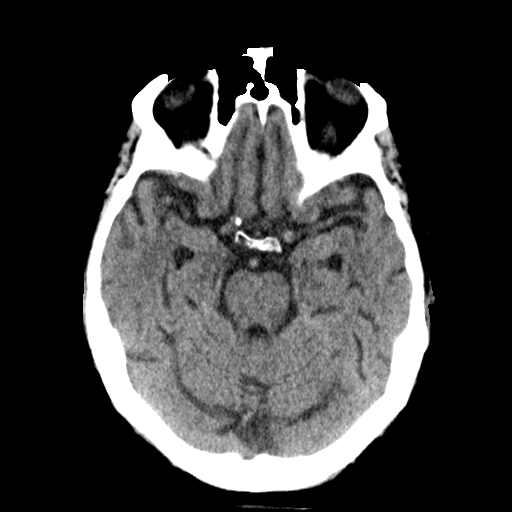
[im 14/33  brain]
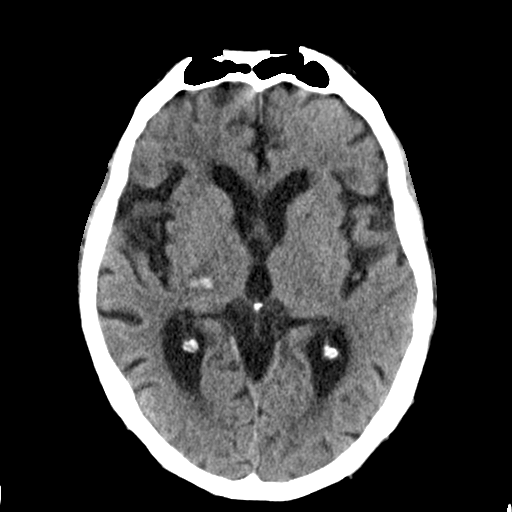
[im 14/33  bone]
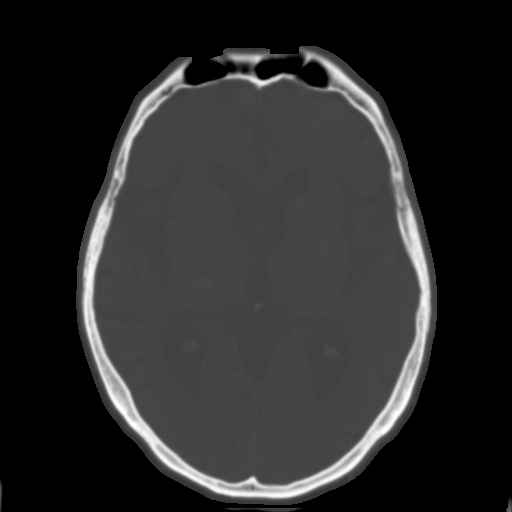
[im 17/33  brain]
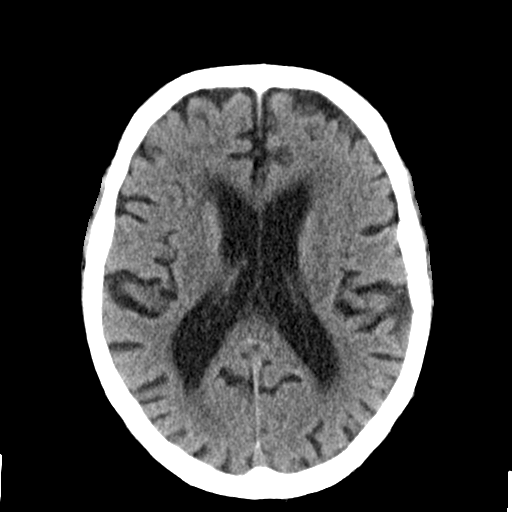
[im 19/33  brain]
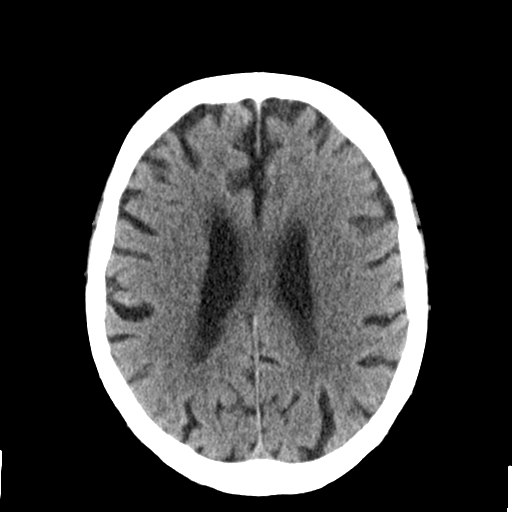
[im 23/33  brain]
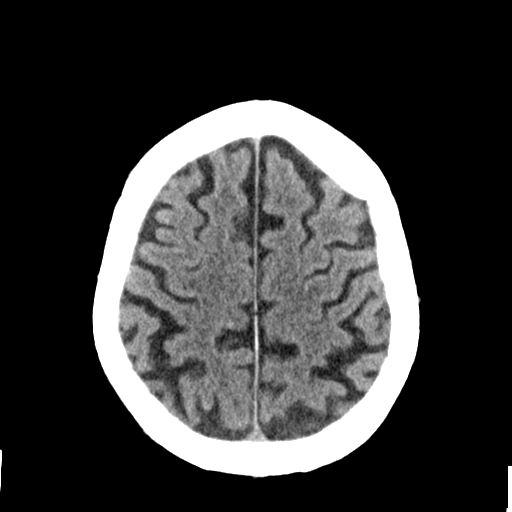
[im 25/33  brain]
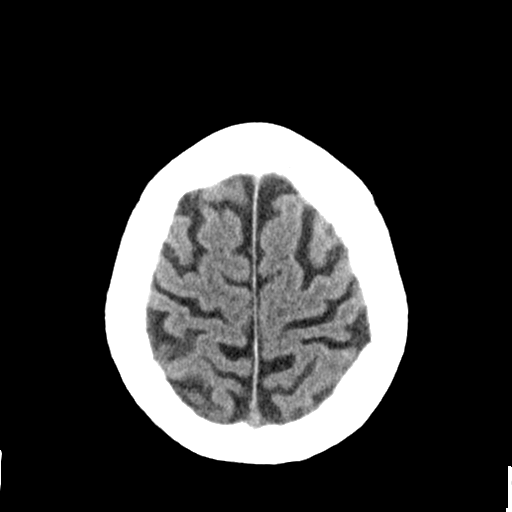
[im 25/33  bone]
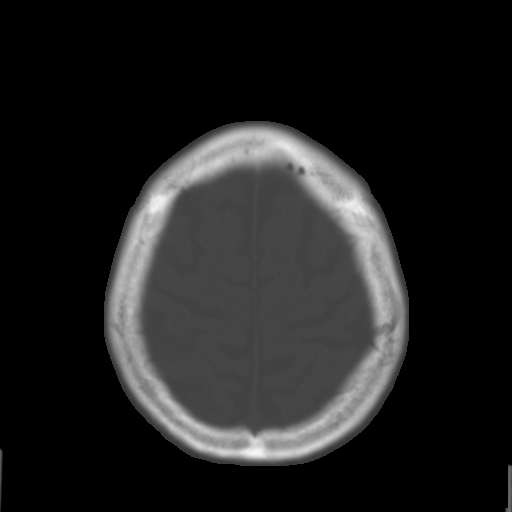
[im 28/33  brain]
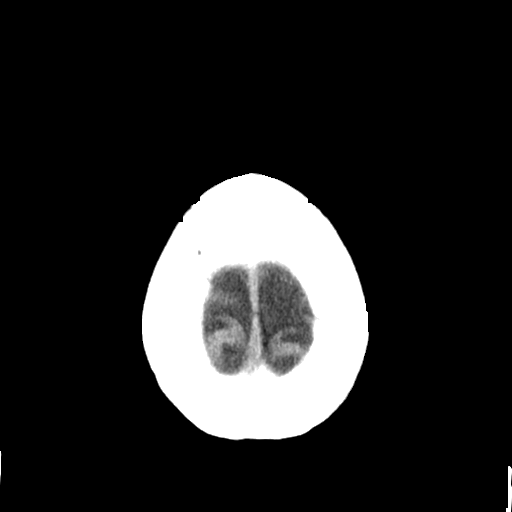
[im 30/33  brain]
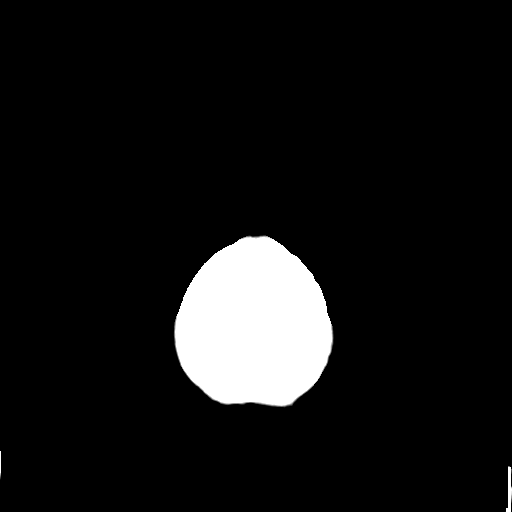

[Series 5: head 3.0 cor st · coronal · 0.32mm/px · 3 of 75 slices shown]
[im 25/75  brain]
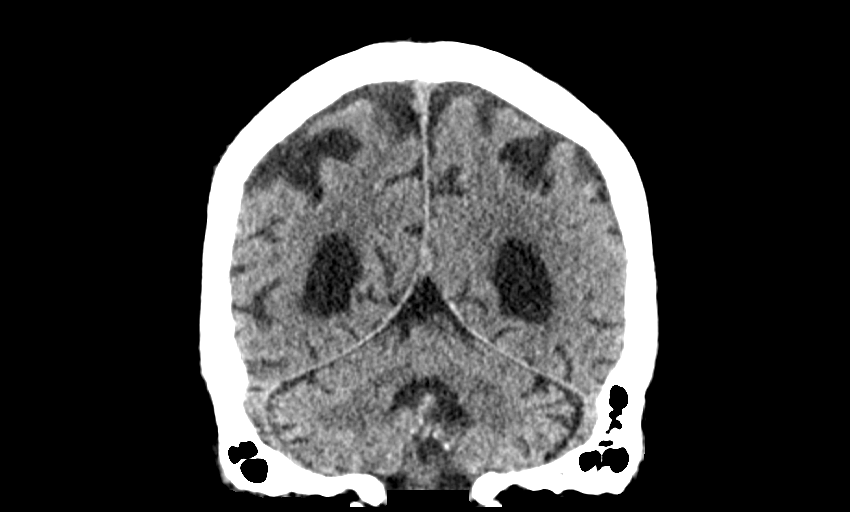
[im 33/75  brain]
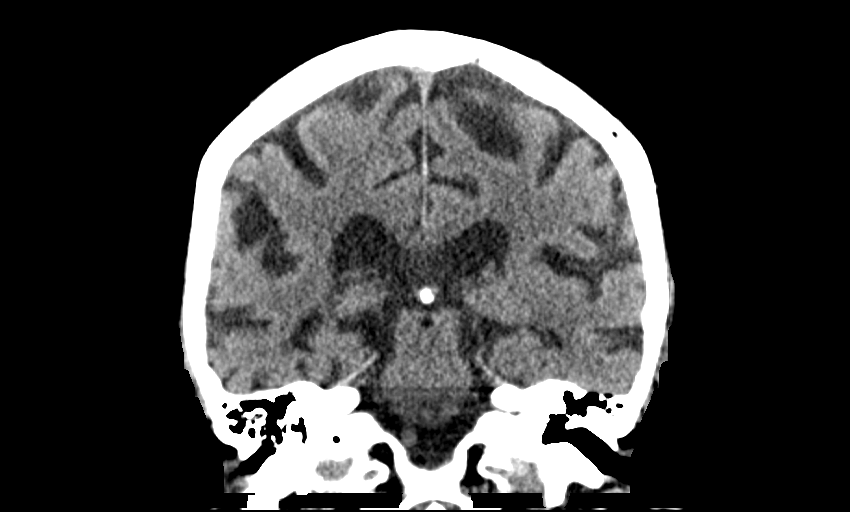
[im 42/75  brain]
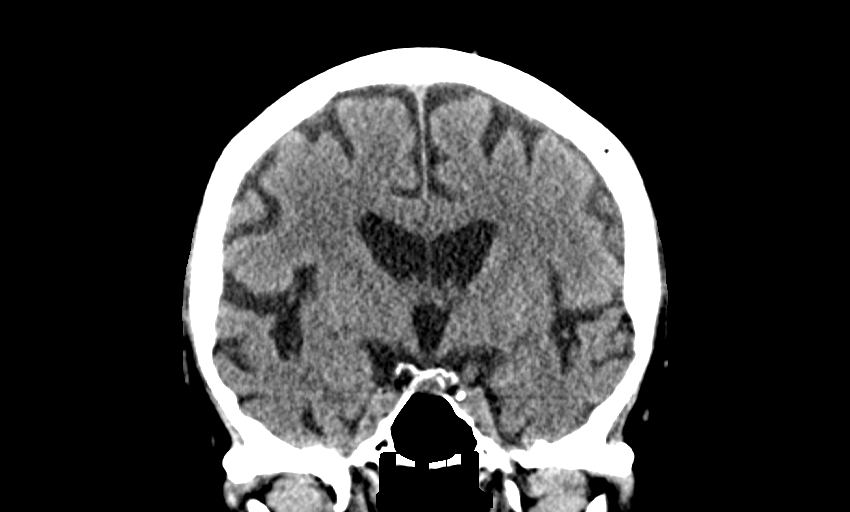

[14 of 47 positions shown; findings below may reference images not displayed]

FINDINGS: Brain: There is hyperdense hemorrhage within the right thalamus
extending into the adjacent white matter and toward the cerebral
peduncle. Measures about 0.7 x 1.6 x 1.1 cm. Mild associated edema.
No mass effect.

Prominence of the ventricles and sulci reflects generalized
parenchymal volume loss. Patchy hypoattenuation in the
supratentorial white matter is nonspecific but may reflect mild to
moderate chronic microvascular ischemic changes. There is no
extra-axial collection.

Vascular: No hyperdense vessel. Intracranial atherosclerotic
calcification at the skull base.

Skull: No acute abnormality.

Sinuses/Orbits: Minor mucosal thickening. Bilateral lens
replacements.

Other: Mastoid air cells are clear.
IMPRESSION: Small area of hyperdense hemorrhage within the right thalamus
extending into adjacent white matter and toward the cerebral
peduncle. Suspect this could be subacute rather than acute and
chronicity could be evaluated by MRI.

These results were called by telephone at the time of interpretation
on 04/12/2022 at [DATE] to provider [REDACTED], who verbally
acknowledged these results.

## 2024-02-16 NOTE — Progress Notes (Signed)
 ASSESSMENT: (N20.1) Calculus of ureter  (primary encounter diagnosis)  Jake Bartlett and Jake Bartlett report for follow-up of his stone disease, BPH.  Examination in October demonstrated 15 g benign gland.  Urinalysis negative.  He is very happy with his voiding pattern.  Believes he passed 2 small stones recently, probably from the right but is not certain.  KUB in October 2024 demonstrated extensive gas, precluding optimal visualization.  I could not appreciate any stones at that time.  He is currently asymptomatic.  We will obtain a KUB in January.  Examination today demonstrates normal external genitalia, mild to moderate paraphimosis.  I reviewed hairdryer technique, hygiene.  We will reassess in a month.  I did review the possibility of circumcision.  Majority of our 30-minute visit was spent in face-to-face consultation reviewing circumcision.  Technique, convalescence, expectations, complications are reviewed.  PLAN: Possible circumcision KUB January   Chief Complaint: No chief complaint on file.   History of Present Illness:  Jake Bartlett is a 83 y.o. year old male who presents for continued evaluation of stone disease, paraphimosis.    Past Medical History:  Past Medical History:  Diagnosis Date  . Benign hypertension with CKD (chronic kidney disease) stage III (CMD) 01/22/2022  . Diabetes mellitus    (CMD)   . Diabetic retinopathy    (CMD)   . High cholesterol   . Parkinsonism    (CMD)     Past Surgical History:  Past Surgical History:  Procedure Laterality Date  . BACK SURGERY     Procedure: BACK SURGERY  . CATARACT EXTRACTION Left 07/11/2020   Procedure: YAG CAPSULOTOMY; Ozell Batman, MD  . CATARACT EXTRACTION Right 08/20/2022   Procedure: YAG CAPSULOTOMY; Dr. Batman  . CATARACT EXTRACTION W/  INTRAOCULAR LENS IMPLANT Bilateral 2018   Procedure: CATARACT EXTRACTION W/  INTRAOCULAR LENS IMPLANT  . HIP SURGERY Right    Procedure: HIP SURGERY  . LITHOTRIPSY  Right 03/01/2022   Procedure: LASER LITHOTRIPSY URETER;  Surgeon: Deward Elsie Stalling, MD;  Location: [facility];  Service: Urology;  Laterality: Right;  c-arm, holmium  . PROSTATE SURGERY     Procedure: PROSTATE SURGERY    Allergies:  Allergies  Allergen Reactions  . Gabapentin Other (See Comments)    Sleepy at higher dose  Drowsiness    Made extremely sleepy - even a lowest dose Drowsiness     Sleepy at higher dose  . Ropinirole Angioedema and Swelling    Angioedema    Medications: Current Outpatient Medications  Medication Sig Dispense Refill  . Accu-Chek Guide Glucose Meter misc Use as directed to check blood sugars 1 each 0  . Accu-Chek Guide L1-L2 Ctrl Sol soln Use as directed with blood glucose monitor 6 each 0  . acetaminophen  (TYLENOL ) 500 mg tablet Take 1,000 mg by mouth every 6 (six) hours as needed.    . blood-glucose meter misc Take as directed. Frequency:   Dosage:0     Instructions:  Note:USE TO CHECK BLOOD SUGAR EVERY MORNING BEFORE BREAKFAST AND 2 HOURS AFTER DINNER    . carbidopa -levodopa  (SINEMET ) 25-250 mg per tablet TAKE 1 TABLET BY MOUTH THREE TIMES DAILY 270 tablet 3  . carvediloL (COREG) 3.125 mg tablet Take 3.125 mg by mouth daily.    . cholecalciferol  (VITAMIN D3) 1,000 unit (25 mcg) tablet Take 1,000 Units by mouth daily.    . docusate sodium  (COLACE) 100 mg capsule Take 100 mg by mouth 2 (two) times a day as needed.    SABRA  erythromycin  (ROMYCIN) 5 mg/gram (0.5 %) ophthalmic ointment 1 Application.    . escitalopram  (LEXAPRO ) 20 mg tablet Take 20 mg by mouth Once Daily. 90 tablet 1  . fluocinonide (LIDEX) 0.05 % solution APPLY TOPICALLY TO THE SCALP TWICE DAILY    . folic acid  (FOLVITE ) 1 mg tablet TAKE 1 TABLET(1 MG) BY MOUTH DAILY 90 tablet 1  . insulin  NPH-insulin  regular (NovoLIN 70/30 U-100 Insulin ) 100 unit/mL (70-30) injection Inject 30 Units under the skin every morning. 20 mL 2  . insulin  syringe-needle U-100 (Insulin  Syringe  MicroFine) 1 mL 27 gauge x 5/8 syrg For injections twice daily E11.65 100 each 2  . ketoconazole (NIZORAL) 2 % shampoo APPLY TOPICALLY 3 TIMES A WEEK    . levothyroxine (SYNTHROID) 25 mcg tablet Take 25 mcg by mouth in the morning.    . mirtazapine (REMERON) 15 mg tablet Take 15 mg by mouth nightly.    . multivitamin (THERAGRAN) tab tablet Take 1 tablet by mouth Once Daily for 30 days. 30 tablet 0  . rosuvastatin  (CRESTOR ) 10 mg tablet Take 10 mg by mouth Once Daily. 90 tablet 0  . tiZANidine (ZANAFLEX) 2 mg tablet Take 4 mg by mouth nightly as needed.    . triamcinolone acetonide (KENALOG) 0.1 % cream APPLY TWICE DAILY FOR UP TO 3 WEEKS IN A ROW     Current Facility-Administered Medications  Medication Dose Route Frequency Provider Last Rate Last Admin  . epoetin alfa (EPOGEN) injection 40,000 Units  40,000 Units subcutaneous Q14 Days Ikechukwu Onyekachi Nwobu, MD   40,000 Units at 02/04/24 1534    Social History:  Social History   Socioeconomic History  . Marital status: Married    Spouse name: None  . Number of children: None  . Years of education: None  . Highest education level: None  Occupational History  . None  Tobacco Use  . Smoking status: Never    Passive exposure: Never  . Smokeless tobacco: Never  Substance and Sexual Activity  . Alcohol use: No  . Drug use: No  . Sexual activity: Not Currently  Other Topics Concern  . None  Social History Narrative  . None   Social Drivers of Health   Food Insecurity: No Food Insecurity (12/05/2023)   Received from [facility]   Food vital sign   . Within the past 12 months, you worried that your food would run out before you got money to buy more: Never true   . Within the past 12 months, the food you bought just didn't last and you didn't have money to get more: Never true  Transportation Needs: No Transportation Needs (12/05/2023)   Received from [facility] - Transportation   . Lack of Transportation  (Medical): No   . Lack of Transportation (Non-Medical): No  Safety: Low Risk  (09/22/2023)   Safety   . How often does anyone, including family and friends, physically hurt you?: Never   . How often does anyone, including family and friends, insult or talk down to you?: Never   . How often does anyone, including family and friends, threaten you with harm?: Never   . How often does anyone, including family and friends, scream or curse at you?: Never  Living Situation: Low Risk  (12/05/2023)   Received from [facility] Situation   . In the last 12 months, was there a time when you were not able to pay the mortgage or rent on time?:  No   . In the past 12 months, how many times have you moved where you were living?: 0   . At any time in the past 12 months, were you homeless or living in a shelter (including now)?: No     The following portions of the patient's history were reviewed and updated as appropriate: allergies, current medications, past family history, past medical history, past social history, past surgical history and problem list.   Objective:  Vital signs in last 24 hours:     ROS:  Constitutional:  Negative for fever, chills, weight loss CV: Negative for chest pain, previous MI, hypertension Respiratory:  Negative for shortness of breath, wheezing, sleep apnea, frequent cough GI:  Negative for nausea, vomiting, bloody stool, GERD    Physical exam GENERAL APPEARANCE:  Well appearing, well developed, well nourished, NAD HEENT: Atraumatic, Normocephalic, oropharynx clear. NECK: Supple without lymphadenopathy or thyromegaly. ABDOMEN: Soft, non-tender, No Masses. EXTREMITIES: Moves all extremities well.  Without clubbing, cyanosis, or edema. NEUROLOGIC:  Alert and oriented x 3, normal gait, CN II-XII grossly intact.  MENTAL STATUS:  Appropriate. BACK:  Non-tender to palpation.  No CVAT SKIN:  Warm, dry and intact.  GENERAL APPEARANCE:  Well appearing, well  developed, well nourished, NAD   Urinalysis results:  No results found for this or any previous visit (from the past 24 hours).  Orders:  Orders placed in this encounter Orders Placed This Encounter  Procedures  . Urinalysis with Reflex to Microscopic  . Urinalysis, Microscopic Only   Outpatient Encounter Medications as of 02/02/2024  Medication Sig Dispense Refill  . Accu-Chek Guide Glucose Meter misc Use as directed to check blood sugars 1 each 0  . Accu-Chek Guide L1-L2 Ctrl Sol soln Use as directed with blood glucose monitor 6 each 0  . acetaminophen  (TYLENOL ) 500 mg tablet Take 1,000 mg by mouth every 6 (six) hours as needed.    . blood-glucose meter misc Take as directed. Frequency:   Dosage:0     Instructions:  Note:USE TO CHECK BLOOD SUGAR EVERY MORNING BEFORE BREAKFAST AND 2 HOURS AFTER DINNER    . carbidopa -levodopa  (SINEMET ) 25-250 mg per tablet TAKE 1 TABLET BY MOUTH THREE TIMES DAILY 270 tablet 3  . carvediloL (COREG) 3.125 mg tablet Take 3.125 mg by mouth daily.    . cholecalciferol  (VITAMIN D3) 1,000 unit (25 mcg) tablet Take 1,000 Units by mouth daily.    . docusate sodium  (COLACE) 100 mg capsule Take 100 mg by mouth 2 (two) times a day as needed.    . erythromycin  (ROMYCIN) 5 mg/gram (0.5 %) ophthalmic ointment 1 Application.    . escitalopram  (LEXAPRO ) 20 mg tablet Take 20 mg by mouth Once Daily. 90 tablet 1  . fluocinonide (LIDEX) 0.05 % solution APPLY TOPICALLY TO THE SCALP TWICE DAILY    . folic acid  (FOLVITE ) 1 mg tablet TAKE 1 TABLET(1 MG) BY MOUTH DAILY 90 tablet 1  . insulin  NPH-insulin  regular (NovoLIN 70/30 U-100 Insulin ) 100 unit/mL (70-30) injection Inject 30 Units under the skin every morning. 20 mL 2  . insulin  syringe-needle U-100 (Insulin  Syringe MicroFine) 1 mL 27 gauge x 5/8 syrg For injections twice daily E11.65 100 each 2  . ketoconazole (NIZORAL) 2 % shampoo APPLY TOPICALLY 3 TIMES A WEEK    . levothyroxine (SYNTHROID) 25 mcg tablet Take 25 mcg by  mouth in the morning.    . mirtazapine (REMERON) 15 mg tablet Take 15 mg by mouth nightly.    . multivitamin (  THERAGRAN) tab tablet Take 1 tablet by mouth Once Daily for 30 days. 30 tablet 0  . rosuvastatin  (CRESTOR ) 10 mg tablet Take 10 mg by mouth Once Daily. 90 tablet 0  . tiZANidine (ZANAFLEX) 2 mg tablet Take 4 mg by mouth nightly as needed.    . triamcinolone acetonide (KENALOG) 0.1 % cream APPLY TWICE DAILY FOR UP TO 3 WEEKS IN A ROW     Facility-Administered Encounter Medications as of 02/02/2024  Medication Dose Route Frequency Provider Last Rate Last Admin  . [DISCONTINUED] epoetin alfa (EPOGEN) injection 20,000 Units  20,000 Units subcutaneous Q28 Days Ikechukwu Onyekachi Nwobu, MD   20,000 Units at 01/06/24 1629     Patient Instructions and Education:  There are no Patient Instructions on file for this visit.   Deward LELON Stalling, MD, MD, FACS  Cc:  Luke MARLA Manns, MD No ref. provider found    Deward LELON Stalling, MD MD  Office: [phone] 02/16/2024, 8:59 PM

## 2024-04-08 NOTE — Progress Notes (Signed)
 Chief Complaint - 2 week Post-operative follow-up visit.  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 He underwent right stem revision on 03/27/2024 with dual mobility secondary to [facility] type B2 fracture. He is  toes touch weightbearing with posterior precautions. He is accompanied by his wife.  He reports that his leg remains pain-free unless subjected to excessive movement or use. He is able to lie down comfortably. He has been engaging in physical therapy, although minimally, and has been advised that there is still a significant recovery journey ahead. He has been avoiding putting weight on his leg as much as possible. His wife inquires about the resolution of previous infections.  SOCIAL HISTORY Marital Status: Married   DVT prophylaxis: Heparin  5000units subcutaneous TID until 05/08/24. If DC from SNF prior to 05/08/24, then ok to transition to EC Aspirin 81mg  po BID to complete course until 05/08/24.   PHYSICAL EXAM There were no vitals filed for this visit.  Right hip with well healed wound.  No evidence of infection with no erythema, no drainage, no open wounds.  Lower extremity is neurovascularly intact distally with motor. Lower extremity with sensation intact.  Foot is warm and well perfused with palpable pulses.   ROM hip: deferred due to recent surgery. Pain with ROM.  No significant pain with range of motion.   Calf soft and compressible without pain. Gait: deferred.  In a wheelchair unable to transfer.   ASSESSMENT 1. 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 Buckalew presents for 2 week follow up after hip surgery. The incision is clean, dry, intact and  healing well.  Most of the staples were removed but proximal ones were kept in place due to incomplete healing.  Covered with Steri-Strips and dry dressing.  Daily dressing changes.    Right lower leg wounds were examined and Xeroform ABD pads Kerlix and Ace wrap.  Extra padding to heel was applied.  Continue with DVT prophylaxis and continue with exercises to work on strength and range of motion. Reminders were given about signs to be aware of including redness, drainage, increased pain, fevers, calf pain, shortness of breath, or any concern should generate a phone call or a return to see us  immediately. We will plan to follow up in 1 week for the remainder of staples to be removed.  Electronically signed by: Angelita Dayton Cera, PA-C 04/08/2024 4:19 PM

## 2024-04-15 NOTE — Progress Notes (Signed)
 Assessment & Plan 1. Postoperative status: His right hip incision is well-healing and remaining staples proximally were removed.  Steri-Strips placed.  Patient may shower.  No submerging.  The lower leg abrasions/skin tears are showing signs of improvement with a noticeable decrease in pain levels.   For right lower leg: Daily bandage changes are necessary, and the area should be kept moisturized using Xeroform.   For right hip: cover with dry dressing daily.   A follow-up appointment is scheduled for 3 to 4 weeks from now, during which an x-ray of the hip will be conducted.  Follow-up: The patient will follow up in 3 to 4 weeks.  PROCEDURE Staples were removed from the leg today. Steri-Strips were applied.  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 He underwent right stem revision on 03/27/2024 with dual mobility secondary to [facility] type B2 fracture. He is  toes touch weightbearing with posterior precautions. He is accompanied by his wife.   He is still at [facility]. It is uncertain whether these bandage changes are performed daily. He has been in the facility for approximately 3 weeks and was informed that his treatment would span a total of 6 weeks.  His pain is much improved.   O:  Vitals:   04/15/24 1421  BP: (!) 101/51  Pulse: 59  Temp: 98 F (36.7 C)   There is no height or weight on file to calculate BMI.  Patient is a 83 y.o. male, cooperative, appears well-developed and well-nourished, in no acute distress. Appears stated age. Skin on inspection is warm and dry without rashes or lesions. Distally lower extremities are warm and well perfused with good pulses bilateral and equal. Patient is distally neurovascularly intact.  Visual Inspection: Incision well healing.  Proximal staples removed and steri strips placed.   Skin tears distal R LE healing well.  ROM:  Pain-free gentle ROM right hip Palpation:  mild TTP MS: deferred Stability:  stable Gait: deferred  Radiographs: None Labs: None  Portions of this note were dictated using Animal nutritionist and Designer, industrial/product.  It has been reviewed for accuracy, but may contain grammatical and clerical errors.  Electronically signed by: Angelita Dayton Cera, PA-C 04/15/2024 2:46 PM

## 2024-06-16 ENCOUNTER — Inpatient Hospital Stay (HOSPITAL_COMMUNITY)
Admission: EM | Admit: 2024-06-16 | Discharge: 2024-06-25 | DRG: 854 | Disposition: A | Discharge status: Skilled Nursing Facility | Source: Ambulatory Visit | Attending: Internal Medicine | Admitting: Internal Medicine

## 2024-06-16 ENCOUNTER — Emergency Department (HOSPITAL_COMMUNITY)

## 2024-06-16 ENCOUNTER — Encounter (HOSPITAL_COMMUNITY): Payer: Self-pay | Admitting: Emergency Medicine

## 2024-06-16 ENCOUNTER — Other Ambulatory Visit: Payer: Self-pay

## 2024-06-16 DIAGNOSIS — L89151 Pressure ulcer of sacral region, stage 1: Secondary | ICD-10-CM | POA: Diagnosis present

## 2024-06-16 DIAGNOSIS — I10 Essential (primary) hypertension: Secondary | ICD-10-CM | POA: Diagnosis not present

## 2024-06-16 DIAGNOSIS — R933 Abnormal findings on diagnostic imaging of other parts of digestive tract: Secondary | ICD-10-CM | POA: Diagnosis not present

## 2024-06-16 DIAGNOSIS — R6520 Severe sepsis without septic shock: Secondary | ICD-10-CM | POA: Diagnosis present

## 2024-06-16 DIAGNOSIS — G20A1 Parkinson's disease without dyskinesia, without mention of fluctuations: Secondary | ICD-10-CM | POA: Diagnosis present

## 2024-06-16 DIAGNOSIS — C188 Malignant neoplasm of overlapping sites of colon: Secondary | ICD-10-CM | POA: Diagnosis present

## 2024-06-16 DIAGNOSIS — D123 Benign neoplasm of transverse colon: Secondary | ICD-10-CM | POA: Diagnosis present

## 2024-06-16 DIAGNOSIS — L89610 Pressure ulcer of right heel, unstageable: Secondary | ICD-10-CM | POA: Diagnosis present

## 2024-06-16 DIAGNOSIS — E872 Acidosis, unspecified: Secondary | ICD-10-CM | POA: Diagnosis present

## 2024-06-16 DIAGNOSIS — K5730 Diverticulosis of large intestine without perforation or abscess without bleeding: Secondary | ICD-10-CM | POA: Diagnosis present

## 2024-06-16 DIAGNOSIS — E119 Type 2 diabetes mellitus without complications: Secondary | ICD-10-CM | POA: Diagnosis not present

## 2024-06-16 DIAGNOSIS — K6389 Other specified diseases of intestine: Secondary | ICD-10-CM

## 2024-06-16 DIAGNOSIS — E1122 Type 2 diabetes mellitus with diabetic chronic kidney disease: Secondary | ICD-10-CM | POA: Diagnosis present

## 2024-06-16 DIAGNOSIS — E7800 Pure hypercholesterolemia, unspecified: Secondary | ICD-10-CM | POA: Diagnosis present

## 2024-06-16 DIAGNOSIS — R627 Adult failure to thrive: Secondary | ICD-10-CM | POA: Diagnosis present

## 2024-06-16 DIAGNOSIS — S91001A Unspecified open wound, right ankle, initial encounter: Secondary | ICD-10-CM

## 2024-06-16 DIAGNOSIS — Z79899 Other long term (current) drug therapy: Secondary | ICD-10-CM

## 2024-06-16 DIAGNOSIS — S91301A Unspecified open wound, right foot, initial encounter: Secondary | ICD-10-CM | POA: Insufficient documentation

## 2024-06-16 DIAGNOSIS — K5909 Other constipation: Secondary | ICD-10-CM | POA: Diagnosis present

## 2024-06-16 DIAGNOSIS — A419 Sepsis, unspecified organism: Secondary | ICD-10-CM | POA: Diagnosis present

## 2024-06-16 DIAGNOSIS — N390 Urinary tract infection, site not specified: Secondary | ICD-10-CM | POA: Diagnosis present

## 2024-06-16 DIAGNOSIS — I959 Hypotension, unspecified: Secondary | ICD-10-CM

## 2024-06-16 DIAGNOSIS — D125 Benign neoplasm of sigmoid colon: Secondary | ICD-10-CM | POA: Diagnosis present

## 2024-06-16 DIAGNOSIS — K648 Other hemorrhoids: Secondary | ICD-10-CM | POA: Diagnosis present

## 2024-06-16 DIAGNOSIS — Z8673 Personal history of transient ischemic attack (TIA), and cerebral infarction without residual deficits: Secondary | ICD-10-CM

## 2024-06-16 DIAGNOSIS — E1169 Type 2 diabetes mellitus with other specified complication: Secondary | ICD-10-CM | POA: Diagnosis present

## 2024-06-16 DIAGNOSIS — M86171 Other acute osteomyelitis, right ankle and foot: Secondary | ICD-10-CM | POA: Diagnosis present

## 2024-06-16 DIAGNOSIS — I129 Hypertensive chronic kidney disease with stage 1 through stage 4 chronic kidney disease, or unspecified chronic kidney disease: Secondary | ICD-10-CM | POA: Diagnosis present

## 2024-06-16 DIAGNOSIS — D509 Iron deficiency anemia, unspecified: Secondary | ICD-10-CM | POA: Diagnosis present

## 2024-06-16 DIAGNOSIS — I679 Cerebrovascular disease, unspecified: Secondary | ICD-10-CM | POA: Diagnosis not present

## 2024-06-16 DIAGNOSIS — N184 Chronic kidney disease, stage 4 (severe): Secondary | ICD-10-CM | POA: Diagnosis present

## 2024-06-16 DIAGNOSIS — Z96641 Presence of right artificial hip joint: Secondary | ICD-10-CM | POA: Diagnosis present

## 2024-06-16 DIAGNOSIS — D494 Neoplasm of unspecified behavior of bladder: Secondary | ICD-10-CM | POA: Diagnosis present

## 2024-06-16 DIAGNOSIS — Z888 Allergy status to other drugs, medicaments and biological substances status: Secondary | ICD-10-CM

## 2024-06-16 DIAGNOSIS — D631 Anemia in chronic kidney disease: Secondary | ICD-10-CM | POA: Diagnosis present

## 2024-06-16 DIAGNOSIS — D122 Benign neoplasm of ascending colon: Secondary | ICD-10-CM | POA: Diagnosis present

## 2024-06-16 DIAGNOSIS — N3001 Acute cystitis with hematuria: Principal | ICD-10-CM

## 2024-06-16 LAB — URINALYSIS, ROUTINE W REFLEX MICROSCOPIC
Bilirubin Urine: NEGATIVE
Glucose, UA: NEGATIVE mg/dL
Ketones, ur: NEGATIVE mg/dL
Nitrite: POSITIVE — AB
Protein, ur: 100 mg/dL — AB
Specific Gravity, Urine: 1.011 (ref 1.005–1.030)
WBC, UA: >50 WBC/hpf (ref 0–5)
pH: 5 (ref 5.0–8.0)

## 2024-06-16 LAB — CBC WITH DIFFERENTIAL/PLATELET
Abs Immature Granulocytes: 0.12 K/uL — ABNORMAL HIGH (ref 0.00–0.07)
Basophils Absolute: 0.1 K/uL (ref 0.0–0.1)
Basophils Relative: 1 %
Eosinophils Absolute: 0.3 K/uL (ref 0.0–0.5)
Eosinophils Relative: 2 %
HCT: 30.6 % — ABNORMAL LOW (ref 39.0–52.0)
Hemoglobin: 9.2 g/dL — ABNORMAL LOW (ref 13.0–17.0)
Immature Granulocytes: 1 %
Lymphocytes Relative: 16 %
Lymphs Abs: 1.9 K/uL (ref 0.7–4.0)
MCH: 30.3 pg (ref 26.0–34.0)
MCHC: 30.1 g/dL (ref 30.0–36.0)
MCV: 100.7 fL — ABNORMAL HIGH (ref 80.0–100.0)
Monocytes Absolute: 0.8 K/uL (ref 0.1–1.0)
Monocytes Relative: 7 %
Neutro Abs: 8.6 K/uL — ABNORMAL HIGH (ref 1.7–7.7)
Neutrophils Relative %: 73 %
Platelets: 505 K/uL — ABNORMAL HIGH (ref 150–400)
RBC: 3.04 MIL/uL — ABNORMAL LOW (ref 4.22–5.81)
RDW: 17 % — ABNORMAL HIGH (ref 11.5–15.5)
WBC: 11.7 K/uL — ABNORMAL HIGH (ref 4.0–10.5)
nRBC: 0 % (ref 0.0–0.2)

## 2024-06-16 LAB — COMPREHENSIVE METABOLIC PANEL WITH GFR
ALT: <5 U/L (ref 0–44)
AST: 25 U/L (ref 15–41)
Albumin: 2.7 g/dL — ABNORMAL LOW (ref 3.5–5.0)
Alkaline Phosphatase: 158 U/L — ABNORMAL HIGH (ref 38–126)
Anion gap: 15 (ref 5–15)
BUN: 43 mg/dL — ABNORMAL HIGH (ref 8–23)
CO2: 19 mmol/L — ABNORMAL LOW (ref 22–32)
Calcium: 8.3 mg/dL — ABNORMAL LOW (ref 8.9–10.3)
Chloride: 103 mmol/L (ref 98–111)
Creatinine, Ser: 2.85 mg/dL — ABNORMAL HIGH (ref 0.61–1.24)
GFR, Estimated: 21 mL/min — ABNORMAL LOW (ref >60)
Glucose, Bld: 150 mg/dL — ABNORMAL HIGH (ref 70–99)
Potassium: 4.7 mmol/L (ref 3.5–5.1)
Sodium: 137 mmol/L (ref 135–145)
Total Bilirubin: 0.4 mg/dL (ref 0.0–1.2)
Total Protein: 6.4 g/dL — ABNORMAL LOW (ref 6.5–8.1)

## 2024-06-16 LAB — I-STAT CG4 LACTIC ACID, ED: Lactic Acid, Venous: 2.7 mmol/L (ref 0.5–1.9)

## 2024-06-16 LAB — GLUCOSE, CAPILLARY: Glucose-Capillary: 115 mg/dL — ABNORMAL HIGH (ref 70–99)

## 2024-06-16 LAB — MRSA NEXT GEN BY PCR, NASAL: MRSA by PCR Next Gen: NOT DETECTED

## 2024-06-16 LAB — LIPASE, BLOOD: Lipase: 37 U/L (ref 11–51)

## 2024-06-16 MED ORDER — ROSUVASTATIN CALCIUM 10 MG PO TABS
10.0000 mg | ORAL_TABLET | Freq: Every day | ORAL | Status: DC
Start: 2024-06-16 — End: 2024-06-25
  Administered 2024-06-16 – 2024-06-24 (×9): 10 mg via ORAL
  Filled 2024-06-16 (×9): qty 1

## 2024-06-16 MED ORDER — PRAMIPEXOLE DIHYDROCHLORIDE 0.25 MG PO TABS
0.2500 mg | ORAL_TABLET | Freq: Every day | ORAL | Status: DC
  Administered 2024-06-16 – 2024-06-24 (×9): 0.25 mg via ORAL
  Filled 2024-06-16 (×9): qty 1

## 2024-06-16 MED ORDER — ONDANSETRON HCL 4 MG PO TABS: 4.0000 mg | ORAL_TABLET | Freq: Four times a day (QID) | ORAL | Status: DC | PRN

## 2024-06-16 MED ORDER — SODIUM CHLORIDE 0.9 % IV SOLN
2.0000 g | INTRAVENOUS | Status: DC
  Administered 2024-06-17 – 2024-06-21 (×4): 2 g via INTRAVENOUS
  Filled 2024-06-16 (×4): qty 20

## 2024-06-16 MED ORDER — PRAMIPEXOLE DIHYDROCHLORIDE 0.125 MG PO TABS: 0.1250 mg | ORAL_TABLET | Freq: Three times a day (TID) | ORAL | Status: DC

## 2024-06-16 MED ORDER — FOLIC ACID 1 MG PO TABS
1.0000 mg | ORAL_TABLET | Freq: Every day | ORAL | Status: DC
  Administered 2024-06-17 – 2024-06-24 (×6): 1 mg via ORAL
  Filled 2024-06-16 (×6): qty 1

## 2024-06-16 MED ORDER — TAMSULOSIN HCL 0.4 MG PO CAPS
0.4000 mg | ORAL_CAPSULE | Freq: Every day | ORAL | Status: DC
  Administered 2024-06-17 – 2024-06-25 (×7): 0.4 mg via ORAL
  Filled 2024-06-16 (×7): qty 1

## 2024-06-16 MED ORDER — SODIUM CHLORIDE 0.9 % IV SOLN
1.0000 g | Freq: Once | INTRAVENOUS | Status: AC
  Administered 2024-06-16: 1 g via INTRAVENOUS
  Filled 2024-06-16: qty 10

## 2024-06-16 MED ORDER — CARBIDOPA-LEVODOPA 25-250 MG PO TABS
1.0000 | ORAL_TABLET | Freq: Two times a day (BID) | ORAL | Status: DC
  Administered 2024-06-16 – 2024-06-25 (×16): 1 via ORAL
  Filled 2024-06-16 (×18): qty 1

## 2024-06-16 MED ORDER — ENOXAPARIN SODIUM 30 MG/0.3ML IJ SOSY
30.0000 mg | PREFILLED_SYRINGE | INTRAMUSCULAR | Status: DC
  Administered 2024-06-17 – 2024-06-18 (×2): 30 mg via SUBCUTANEOUS
  Filled 2024-06-16 (×2): qty 0.3

## 2024-06-16 MED ORDER — ADULT MULTIVITAMIN W/MINERALS CH
1.0000 | ORAL_TABLET | Freq: Every day | ORAL | Status: DC
  Administered 2024-06-17 – 2024-06-24 (×6): 1 via ORAL
  Filled 2024-06-16 (×6): qty 1

## 2024-06-16 MED ORDER — VITAMIN D 25 MCG (1000 UNIT) PO TABS
1000.0000 [IU] | ORAL_TABLET | Freq: Every day | ORAL | Status: DC
  Administered 2024-06-17 – 2024-06-24 (×6): 1000 [IU] via ORAL
  Filled 2024-06-16 (×6): qty 1

## 2024-06-16 MED ORDER — LACTATED RINGERS IV SOLN: INTRAVENOUS | Status: AC

## 2024-06-16 MED ORDER — SODIUM BICARBONATE 650 MG PO TABS
650.0000 mg | ORAL_TABLET | Freq: Two times a day (BID) | ORAL | Status: DC
  Administered 2024-06-16 – 2024-06-17 (×3): 650 mg via ORAL
  Filled 2024-06-16 (×3): qty 1

## 2024-06-16 MED ORDER — ACETAMINOPHEN 650 MG RE SUPP
650.0000 mg | Freq: Four times a day (QID) | RECTAL | Status: DC | PRN
Start: 2024-06-16 — End: 2024-06-22

## 2024-06-16 MED ORDER — INSULIN ASPART 100 UNIT/ML IJ SOLN
0.0000 [IU] | Freq: Three times a day (TID) | INTRAMUSCULAR | Status: DC
  Administered 2024-06-17: 2 [IU] via SUBCUTANEOUS
  Filled 2024-06-16: qty 0.09

## 2024-06-16 MED ORDER — ESCITALOPRAM OXALATE 10 MG PO TABS: 20.0000 mg | ORAL_TABLET | Freq: Every day | ORAL | Status: DC

## 2024-06-16 MED ORDER — INSULIN ISOPHANE & REGULAR (HUMAN 70-30)100 UNIT/ML KWIKPEN: 7.0000 [IU] | PEN_INJECTOR | Freq: Two times a day (BID) | SUBCUTANEOUS | Status: DC

## 2024-06-16 MED ORDER — INSULIN ASPART PROT & ASPART (70-30 MIX) 100 UNIT/ML ~~LOC~~ SUSP
7.0000 [IU] | Freq: Two times a day (BID) | SUBCUTANEOUS | Status: DC
  Administered 2024-06-17 – 2024-06-20 (×8): 7 [IU] via SUBCUTANEOUS
  Filled 2024-06-16: qty 10

## 2024-06-16 MED ORDER — ESCITALOPRAM OXALATE 10 MG PO TABS
10.0000 mg | ORAL_TABLET | Freq: Every day | ORAL | Status: DC
  Administered 2024-06-17 – 2024-06-25 (×7): 10 mg via ORAL
  Filled 2024-06-16 (×7): qty 1

## 2024-06-16 MED ORDER — INSULIN ASPART 100 UNIT/ML IJ SOLN
0.0000 [IU] | Freq: Every day | INTRAMUSCULAR | Status: DC
  Filled 2024-06-16: qty 0.05

## 2024-06-16 MED ORDER — LACTATED RINGERS IV BOLUS
1000.0000 mL | Freq: Once | INTRAVENOUS | Status: AC
  Administered 2024-06-16: 1000 mL via INTRAVENOUS

## 2024-06-16 MED ORDER — ACETAMINOPHEN 325 MG PO TABS
650.0000 mg | ORAL_TABLET | Freq: Four times a day (QID) | ORAL | Status: DC | PRN
  Administered 2024-06-17 – 2024-06-18 (×2): 650 mg via ORAL
  Filled 2024-06-16 (×2): qty 2

## 2024-06-16 MED ORDER — ONDANSETRON HCL 4 MG/2ML IJ SOLN: 4.0000 mg | Freq: Four times a day (QID) | INTRAMUSCULAR | Status: DC | PRN

## 2024-06-16 NOTE — ED Triage Notes (Signed)
 Patient presents his primary care office due to concern for infection and general weakness. He was released from [facility], post rehab for broken hip recently. Since discharge, he has experienced weakness. He also had nausea, vomiting and diarrhea a few days ago. EMS noted 70/40 BP.    EMS vitals:  119/54 BP 85 HR 96 % SPO2 on room air 161 CBG 97.1 Temp

## 2024-06-16 NOTE — Consult Note (Addendum)
 WOC Nurse Consult Note: patient with recent hip fracture; resultant R ankle wound from traction Reason for Consult: R ankle and R heel wounds  Wound type: 1. Unstageable Pressure Injury R heel  2.  Stage 3 Pressure Injury R ankle (pressure from medical device)  Pressure Injury POA: Yes Measurement: see nursing flowsheet  Wound bed: R heel 60% dark tan necrotic 40% pink; R ankle 75% red 25% tan fibrinous  Drainage (amount, consistency, odor) malodorous to heel per MD note  Periwound: peeling skin from heel  Dressing procedure/placement/frequency: Cleanse R heel with Vashe wound cleanser Soila [phone]) do not rinse and allow to air dry.  Apply Vashe moistened gauze to wound bed 2 times daily, cover with dry gauze and secure with Kerlix roll guaze.  Place R foot in Prevalon boot to offload pressure.  Gerlean [phone] Cleanse R ankle wound with Vashe, do not rinse, allow to air dry. Using a Q tip applicator apply silver hydrofiber (Aquacel [employer] Lawson [phone]) cut to fit wound bed daily and secure with silicone foam.  Place R foot in Prevalon boot as above.   Patient would benefit from ongoing management of these wounds at wound care center/orthopedics office.  Patient is followed by [facility] ortho for R hip but do not appear to be managing the ankle and heel wounds.  X-rays appear to be negative for osteomyelitis at this time.    POC discussed with bedside nurse. WOC team will not follow.  Re-consult if further needs arise.   Thank you,    Powell Bar MSN, RN-BC, [employer]

## 2024-06-16 NOTE — H&P (Signed)
 History and Physical    Jake Bartlett FMW:[id number] DOB: Apr 05, 1941 DOA: 06/16/2024  PCP: Alyse Bradley, MD   Patient coming from: Home Chief Complaint  Patient presents with   Weakness   Wound Infection   HPI: 83 year old male with past medical history of CKD 4/metabolic acidosis baseline BUN/creat 14/2.8 in May/2024, chronic anemia hemoglobin baseline around 8-10 gm, type 2 diabetes, hypertension, and history of right thalamic ICH with left-sided weakness, Parkinson disease with recent right hip total replacement revision,staple removal on 04/15/2024 was sent from PCP office due to concern for infection and weakness. Patient was recently admitted for a fracture and discharged to rehab from where he was released and he has been having weakness since discharge.  Patient has a sitter nausea vomiting diarrhea a few days ago. For EMS he was hypotensive 70/40, on arrival blood pressure 109/54, saturating well on room air afebrile. He has lots of weakness and gone down hill real fast per wife Patient otherwise denies any nausea, vomiting, chest pain, fever, chills, headache, focal weakness, numbness tingling, speech difficulties  Labs obtained-BUN 43/creat 2.8, bicarb 19 Elamin 2.7 normal LFTs lactic acid 2.7 WBC 9.7 and hemoglobin 9.2 platelet 505, UA abnormal-more than 50 bacteria many nitrate positive leuk positive.  Urine and blood culture ordered, 1 L LR, ceftriaxone  1 g given Recent urine culture 03/07/2023-last year had Klebsiella sensitive to ceftriaxone . X ray ankle >soft tissue defect/wound overlying the posterior calcaneus without evidence of osteomyelitis. CT abdomen pelvis> significant increase in cecal mass consistent with colonic malignancy, colonoscopy recommended, sigmoid diverticulosis, left renal calculi left lateral bladder wall thickening/urothelial neoplasm not excluded. Chest x-ray low lung volume. Admission requested for further management.  His hemodynamics are stable and  repeat lactic acid pending. [facility] was being consulted in ED.  Assessment and plan:  Severe Sepsis secondary to UTI: Continue ceftriaxone  2 g, follow-up urine and blood culture.  Previously had  Klebsiella - Continue ceftriaxone   Right heel wound: Imaging without evidence of osteomyelitis, consult wound care-they have advised patient will need outpatient follow-up with his wound care clinic.  Cecal mass-4.3 x 8.0 cm  Possible urothelial mass: [facility]  consulted given new finding of cecal mass. He may eventually need urological evaluation later on question cystoscopy likely outpatient Of note he had colon[facility] at [facility] ago and was normal per wife.  CKD 4/metabolic acidosis: baseline BUN/creat 14/2.8 in May/2024.  Remains at baseline, continue gentle IV fluid hydration, add p.o. bicarb and monitor  Chronic anemia: hemoglobin baseline around 8-10 gm, which is the same  Type 2 diabetes: Blood sugar currently stable in 140s. Cont on 70/30 insulin  10 u bid at home on 20u In am and 10 in pm, add ssi  Hypertension Hold coreg due to his sepsis.  History of right thalamic ICH with left-sided weakness Parkinson disease: Generalized deconditioning/debility. obtain PT OT evaluation .Continue rest of the home meds including Sinemet   DVT prophylaxis: enoxaparin  (LOVENOX ) injection 30 mg Start: 06/16/24 1715 SCDs Start: 06/16/24 1711 Code Status:   Code Status: Full Code Family Communication: plan of care discussed with patient/wife at bedside. Patient status is: Remains hospitalized because of severity of illness Level of care: Progressive   Dispo: The patient is from: home            Anticipated disposition: TBD Objective: Vitals last 24 hrs: Vitals:   06/16/24 1146 06/16/24 1407  BP: 119/69 134/61  Pulse: 79 81  Resp: (!) 26 18  Temp: 97.6 F (36.4 C)  97.6 F (36.4 C)  TempSrc: Oral Oral  SpO2: 100% 100%    Physical Examination: General exam: alert awake,  oriented,weak frail HEENT:Oral mucosa moist, Ear/Nose WNL grossly Respiratory system: Bilaterally clear BS,no use of accessory muscle Cardiovascular system: S1 & S2 +, No JVD. Gastrointestinal system: Abdomen soft,NT,ND, BS+ Nervous System: Alert, awake, moving all extremities,and following commands. Extremities: LE edema neg, distal extremities warm.  Skin: No rashes,no icterus. Rt leg and heel with  wound see pic  MSK: Normal muscle bulk,tone, power    Medications reviewed:  Scheduled Meds:  carbidopa -levodopa   1 tablet Oral BID   cholecalciferol   1,000 Units Oral Daily   enoxaparin  (LOVENOX ) injection  30 mg Subcutaneous Q24H   escitalopram   10 mg Oral Daily   folic acid   1 mg Oral Daily   insulin  aspart  0-5 Units Subcutaneous QHS   [START ON 06/17/2024] insulin  aspart  0-9 Units Subcutaneous TID WC   insulin  isophane & regular human KwikPen  7 Units Subcutaneous BID PC   [START ON 06/17/2024] multivitamin with minerals  1 tablet Oral Daily   pramipexole   0.25 mg Oral QHS   rosuvastatin   10 mg Oral QHS   sodium bicarbonate   650 mg Oral BID   tamsulosin   0.4 mg Oral Daily   Continuous Infusions:  [START ON 06/17/2024] cefTRIAXone  (ROCEPHIN )  IV     lactated ringers      Diet: Diet Order             Diet full liquid Room service appropriate? Yes; Fluid consistency: Thin  Diet effective now                     Severity of Illness: The appropriate patient status for this patient is INPATIENT. Inpatient status is judged to be reasonable and necessary in order to provide the required intensity of service to ensure the patient's safety. The patient's presenting symptoms, physical exam findings, and initial radiographic and laboratory data in the context of their chronic comorbidities is felt to place them at high risk for further clinical deterioration. Furthermore, it is not anticipated that the patient will be medically stable for discharge from the hospital within 2 midnights  of admission.   * I certify that at the point of admission it is my clinical judgment that the patient will require inpatient hospital care spanning beyond 2 midnights from the point of admission due to high intensity of service, high risk for further deterioration and high frequency of surveillance required.*  Family Communication: Admission, patients condition and plan of care including tests being ordered have been discussed with the patient and wife who indicate understanding and agree with the plan and Code Status.  Consults called:  [facility]  Review of Systems: All systems were reviewed and were negative except as mentioned in HPI above. Negative for fever Negative for chest pain Negative for shortness of breath  Past Medical History:  Diagnosis Date   Diabetes mellitus without complication (HCC)    High cholesterol    Stroke [facility])     Past Surgical History:  Procedure Laterality Date   BACK SURGERY     HIP SURGERY     PENILE PROSTHESIS IMPLANT  08/14/2006   [facility] Penile Prosthesis; Performed by MYRTIS Stalling MD     reports that he has never smoked. He has never used smokeless tobacco. He reports that he does not drink alcohol and does not use drugs.  Allergies  Allergen Reactions   Ropinirole Swelling  and Anaphylaxis    Angioedema   Gabapentin Other (See Comments)    Drowsiness  Made extremely sleepy - even a lowest dose  Drowsiness   Sleepy at higher dose  Sleepy at higher dose    Drowsiness  Made extremely sleepy - even a lowest dose Drowsiness   Sleepy at higher dose    History reviewed. No pertinent family history.   Prior to Admission medications   Medication Sig Start Date End Date Taking? Authorizing Provider  acetaminophen  (TYLENOL ) 500 MG tablet Take 500-1,000 mg by mouth every 8 (eight) hours as needed for mild pain (pain score 1-3) (or headaches).   Yes [provider]  B Complex Vitamins (VITAMIN B COMPLEX) TABS Take 1 tablet by mouth  daily.   Yes [provider]  carbidopa -levodopa  (SINEMET  IR) 25-250 MG tablet Take 1 tablet by mouth in the morning, at noon, and at bedtime. 01/09/22  Yes [provider]  carvedilol (COREG) 3.125 MG tablet Take 3.125 mg by mouth in the morning.   Yes [provider]  Cholecalciferol  (VITAMIN D3 PO) Take 1 capsule by mouth daily.   Yes [provider]  escitalopram  (LEXAPRO ) 10 MG tablet Take 10 mg by mouth daily.   Yes [provider]  folic acid  (FOLVITE ) 1 MG tablet Take 1 mg by mouth daily. 11/23/21  Yes [provider]  Multiple Vitamin (MULTIVITAMIN) tablet Take 1 tablet by mouth daily with breakfast.   Yes [provider]  NOVOLIN 70/30 KWIKPEN (70-30) 100 UNIT/ML KwikPen Inject 10-20 Units into the skin See admin instructions. Inject 20 units into the skin in the morning and 10 units at bedtime   Yes [provider]  pramipexole  (MIRAPEX ) 0.25 MG tablet Take 0.25 mg by mouth at bedtime.   Yes [provider]  rosuvastatin  (CRESTOR ) 10 MG tablet Take 10 mg by mouth at bedtime. 01/16/22  Yes [provider]  cefpodoxime  (VANTIN ) 200 MG tablet Take 1 tablet (200 mg total) by mouth 2 (two) times daily. Patient not taking: Reported on 06/16/2024 03/07/23   Patsey Lot, MD  escitalopram  (LEXAPRO ) 20 MG tablet Take 20 mg by mouth daily. Patient not taking: Reported on 06/16/2024 04/10/22   [provider]  pramipexole  (MIRAPEX ) 0.125 MG tablet Take 1 tablet (0.125 mg total) by mouth 3 (three) times daily. Patient not taking: Reported on 06/16/2024 04/16/22   Leotis Bogus, MD  tamsulosin  (FLOMAX ) 0.4 MG CAPS capsule Take 1 capsule (0.4 mg total) by mouth daily. Patient not taking: Reported on 06/16/2024 02/24/22   Towana Ozell BROCKS, MD   r   Labs on Admission: I have personally reviewed following labs and imaging studies  CBC: Recent Labs  Lab 06/16/24 1208  WBC 11.7*  NEUTROABS 8.6*  HGB  9.2*  HCT 30.6*  MCV 100.7*  PLT 505*   Basic Metabolic Panel: Recent Labs  Lab 06/16/24 1208  NA 137  K 4.7  CL 103  CO2 19*  GLUCOSE 150*  BUN 43*  CREATININE 2.85*  CALCIUM  8.3*   CrCl cannot be calculated (Unknown ideal weight.). Recent Labs  Lab 06/16/24 1208  AST 25  ALT <5  ALKPHOS 158*  BILITOT 0.4  PROT 6.4*  ALBUMIN 2.7*   Recent Labs  Lab 06/16/24 1208  LIPASE 37   No results for input(s): AMMONIA in the last 168 hours. Coagulation Profile: No results for input(s): INR, PROTIME in the last 168 hours. Cardiac Panel (last 3 results) No results for input(s):  CKTOTAL, CKMB, TROPONINIHS, RELINDX in the last 72 hours.  BNP (last 3 results) No results for input(s): PROBNP in the last 8760 hours. HbA1C: No results for input(s): HGBA1C in the last 72 hours. CBG: No results for input(s): GLUCAP in the last 168 hours. Lipid Profile: No results for input(s): CHOL, HDL, LDLCALC, TRIG, CHOLHDL, LDLDIRECT in the last 72 hours. Thyroid Function Tests: No results for input(s): TSH, T4TOTAL, FREET4, T3FREE, THYROIDAB in the last 72 hours. Urine analysis:    Component Value Date/Time   COLORURINE YELLOW 06/16/2024 1405   APPEARANCEUR CLOUDY (A) 06/16/2024 1405   LABSPEC 1.011 06/16/2024 1405   PHURINE 5.0 06/16/2024 1405   GLUCOSEU NEGATIVE 06/16/2024 1405   HGBUR MODERATE (A) 06/16/2024 1405   BILIRUBINUR NEGATIVE 06/16/2024 1405   KETONESUR NEGATIVE 06/16/2024 1405   PROTEINUR 100 (A) 06/16/2024 1405   NITRITE POSITIVE (A) 06/16/2024 1405   LEUKOCYTESUR LARGE (A) 06/16/2024 1405    Radiological Exams on Admission: DG Ankle 2 Views Right Result Date: 06/16/2024 CLINICAL DATA:  Repeat lateral image of the right ankle. EXAM: RIGHT ANKLE - 2 VIEW COMPARISON:  Same day radiographs of the right ankle dated 06/16/2024 at 12:13 p.m. FINDINGS: Soft tissue defect/wound overlying the posterior calcaneus. No radiographic  evidence of acute osteolysis or erosive changes. Plantar calcaneal spur. IMPRESSION: Soft tissue defect/wound overlying the posterior calcaneus without radiographic evidence to suggest acute osteomyelitis at this time. Electronically Signed   By: Harrietta Sherry M.D.   On: 06/16/2024 14:35   CT ABDOMEN PELVIS WO CONTRAST Result Date: 06/16/2024 CLINICAL DATA:  Abdominal pain, nausea vomiting and diarrhea. EXAM: CT ABDOMEN AND PELVIS WITHOUT CONTRAST TECHNIQUE: Multidetector CT imaging of the abdomen and pelvis was performed following the standard protocol without IV contrast. RADIATION DOSE REDUCTION: This exam was performed according to the departmental dose-optimization program which includes automated exposure control, adjustment of the mA and/or kV according to patient size and/or use of iterative reconstruction technique. COMPARISON:  CT abdomen pelvis dated 02/13/2023. FINDINGS: Evaluation of this exam is limited in the absence of intravenous contrast. Lower chest: Partially visualized small left pleural effusion. There is coronary vascular calcification. No intra-abdominal free air or free fluid. Hepatobiliary: Small cyst in the left lobe of the liver. No biliary dilatation. The gallbladder is unremarkable. Pancreas: Unremarkable. No pancreatic ductal dilatation or surrounding inflammatory changes. Spleen: Normal in size without focal abnormality. Adrenals/Urinary Tract: The adrenal glands unremarkable. No significant interval change in the left renal interpolar cyst. Tiny nonobstructing left renal calculi measure up to 2 mm. No hydronephrosis. There is no hydronephrosis or nephrolithiasis on the right. The visualized ureters appear unremarkable. There is slight thickened and irregular appearance of the left lateral bladder wall which may be related to underdistention. A urothelial neoplasm is not excluded. Further evaluation with cystoscopy is recommended. Stomach/Bowel: Significant interval increase in  the size of the cecal mass seen on the prior CT filling of the cecum and measuring approximately 4.3 x 8.0 cm (coronal 60/8). This is consistent with a colonic malignancy. Further evaluation with colonoscopy is recommended. There is sigmoid diverticulosis without active inflammatory changes. There is no bowel obstruction. The appendix is not visualized with certainty. No inflammatory changes identified in the right lower quadrant. For Vascular/Lymphatic: Moderate aortoiliac atherosclerotic disease. The IVC is unremarkable. No portal venous gas. There is no adenopathy. A subcentimeter lymph node posterior to the cecum and anterior to the right psoas muscle measures 6 mm in short axis slightly increased since the prior CT. Reproductive:  The prostate and seminal vesicles are grossly unremarkable. Penile implant with reservoir in the anterior pelvis. Other: None Musculoskeletal: Osteopenia with degenerative changes of the spine. Total right hip arthroplasty. No acute osseous pathology. IMPRESSION: 1. 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 Further evaluation with colonoscopy is recommended. 2. Sigmoid diverticulosis. No bowel obstruction. 3. Tiny nonobstructing left renal calculi. No hydronephrosis. 4. Slight thickened and irregular appearance of the left lateral bladder wall. A urothelial neoplasm is not excluded. Further evaluation with cystoscopy is recommended. 5.  Aortic Atherosclerosis (ICD10-I70.0). Electronically Signed   By: Vanetta Chou M.D.   On: 06/16/2024 14:00   DG Ankle Complete Right Result Date: 06/16/2024 EXAM: 3 or more VIEW(S) XRAY OF THE RIGHT ANKLE 06/16/2024 12:17:00 PM CLINICAL HISTORY: Weakness, wound infection - He was released from [facility], post rehab for broken hip recently. Since discharge, he has experienced weakness. He also had nausea, vomiting and diarrhea a few days ago. Pt has a wound on his right anterior ankle  from; his leg being in traction from a recent right hip fracture. COMPARISON: None available. FINDINGS: BONES AND JOINTS: Calcaneal spur. No acute fracture. No joint dislocation. SOFT TISSUES: Mild diffuse soft tissue swelling. Soft tissue defect is noted posterior to the calcaneus with underlying focal area of osteopenia involving the posterior calcaneus. Note: this area is obscured by external artifact. If there is a focal wound in this area, I would suggest repeating the lateral projection radiograph of the ankle after removal of the external artifact to confirm presence of focal bone erosion which may reflect osteomyelitis of the calcaneus. IMPRESSION: 1. Soft tissue defect posterior to the calcaneus with underlying focal osteopenia, obscured by external artifact. If a focal wound is present in this area, I would recommend repeat lateral radiograph after artifact removal to confirm possible bone erosion, which may reflect osteomyelitis. 2. Mild diffuse soft tissue swelling. Electronically signed by: Waddell Calk MD 06/16/2024 12:50 PM EDT RP Workstation: [id number]   DG Chest Portable 1 View Result Date: 06/16/2024 EXAM: 1 VIEW XRAY OF THE CHEST 06/16/2024 12:17:00 PM COMPARISON: 03/23/2024 CLINICAL HISTORY: Weakness, wound infection. Patient was released from [facility], post rehab for broken hip recently. Since discharge, he has experienced weakness. He also had nausea, vomiting and diarrhea a few days ago. Patient has a wound on his right anterior ankle from his leg being in traction from a recent right hip fracture. FINDINGS: LUNGS AND PLEURA: Low lung volumes with elevated left hemidiaphragm. Mild left lung base atelectasis. No focal pulmonary opacity. No pulmonary edema. No pleural effusion. No pneumothorax. HEART AND MEDIASTINUM: Aortic atherosclerosis. No acute abnormality of the cardiac and mediastinal silhouettes. BONES AND SOFT TISSUES: Remote healed left lateral rib fracture deformity. No  acute osseous abnormality. IMPRESSION: 1. Low lung volumes with elevated left hemidiaphragm and mild left lung base atelectasis. Electronically signed by: Waddell Calk MD 06/16/2024 12:46 PM EDT RP Workstation: [id number]    Mennie LAMY MD [facility]  If 7PM-7AM, please contact night-coverage [email or web address]  06/16/2024, 7:15 PM

## 2024-06-16 NOTE — Hospital Course (Addendum)
 83 year old male with past medical history of CKD 4/metabolic acidosis baseline BUN/creat 14/2.8 in May/2024, chronic anemia hemoglobin baseline around 8-10 gm, type 2 diabetes, hypertension, and history of right thalamic ICH with left-sided weakness, Parkinson disease with recent right hip total replacement revision,staple removal on 04/15/2024 was sent from PCP office due to concern for infection and weakness. Patient was recently admitted for a fracture and discharged to rehab from where he was released and he has been having weakness since discharge.  Patient has a sitter nausea vomiting diarrhea a few days ago. For EMS he was hypotensive 70/40, on arrival blood pressure 109/54, saturating well on room air afebrile. He has lots of weakness and gone down hill real fast per wife Patient otherwise denies any nausea, vomiting, chest pain, fever, chills, headache, focal weakness, numbness tingling, speech difficulties  Labs obtained-BUN 43/creat 2.8, bicarb 19 Elamin 2.7 normal LFTs lactic acid 2.7 WBC 9.7 and hemoglobin 9.2 platelet 505, UA abnormal-more than 50 bacteria many nitrate positive leuk positive.  Urine and blood culture ordered, 1 L LR, ceftriaxone  1 g given Recent urine culture 03/07/2023-last year had Klebsiella sensitive to ceftriaxone . X ray ankle >soft tissue defect/wound overlying the posterior calcaneus without evidence of osteomyelitis. CT abdomen pelvis> significant increase in cecal mass consistent with colonic malignancy, colonoscopy recommended, sigmoid diverticulosis, left renal calculi left lateral bladder wall thickening/urothelial neoplasm not excluded. Chest x-ray low lung volume. Admission requested for further management.  His hemodynamics are stable and repeat lactic acid pending. [facility] was being consulted in ED.  Assessment and plan:  Severe Sepsis secondary to UTI: Continue ceftriaxone  2 g, follow-up urine and blood culture.  Previously had  Klebsiella - Continue  ceftriaxone   Right heel wound: Imaging without evidence of osteomyelitis, consult wound care-they have advised patient will need outpatient follow-up with his wound care clinic.  Cecal mass-4.3 x 8.0 cm  Possible urothelial mass: [facility]  consulted given new finding of cecal mass. He may eventually need urological evaluation later on question cystoscopy likely outpatient Of note he had colonoscopy at [facility] a [facility] ago and was normal per wife.  CKD 4/metabolic acidosis: baseline BUN/creat 14/2.8 in May/2024.  Remains at baseline, continue gentle IV fluid hydration, add p.o. bicarb and monitor  Chronic anemia: hemoglobin baseline around 8-10 gm, which is the same  Type 2 diabetes: Blood sugar currently stable in 140s. Cont on 70/30 insulin  10 u bid at home on 20u In am and 10 in pm, add ssi  Hypertension Hold coreg due to his sepsis.  History of right thalamic ICH with left-sided weakness Parkinson disease: Generalized deconditioning/debility. obtain PT OT evaluation .Continue rest of the home meds including Sinemet   DVT prophylaxis: enoxaparin  (LOVENOX ) injection 30 mg Start: 06/16/24 1715 SCDs Start: 06/16/24 1711 Code Status:   Code Status: Full Code Family Communication: plan of care discussed with patient/wife at bedside. Patient status is: Remains hospitalized because of severity of illness Level of care: Progressive   Dispo: The patient is from: home            Anticipated disposition: TBD Objective: Vitals last 24 hrs: Vitals:   06/16/24 1146 06/16/24 1407  BP: 119/69 134/61  Pulse: 79 81  Resp: (!) 26 18  Temp: 97.6 F (36.4 C) 97.6 F (36.4 C)  TempSrc: Oral Oral  SpO2: 100% 100%    Physical Examination: General exam: alert awake, oriented,weak frail HEENT:Oral mucosa moist, Ear/Nose WNL grossly Respiratory system: Bilaterally clear BS,no use of accessory muscle Cardiovascular system:  S1 & S2 +, No JVD. Gastrointestinal system: Abdomen soft,NT,ND,  BS+ Nervous System: Alert, awake, moving all extremities,and following commands. Extremities: LE edema neg, distal extremities warm.  Skin: No rashes,no icterus. Rt leg and heel with  wound see pic  MSK: Normal muscle bulk,tone, power    Medications reviewed:  Scheduled Meds:  carbidopa -levodopa   1 tablet Oral BID   cholecalciferol   1,000 Units Oral Daily   enoxaparin  (LOVENOX ) injection  30 mg Subcutaneous Q24H   escitalopram   10 mg Oral Daily   folic acid   1 mg Oral Daily   insulin  aspart  0-5 Units Subcutaneous QHS   [START ON 06/17/2024] insulin  aspart  0-9 Units Subcutaneous TID WC   insulin  isophane & regular human KwikPen  7 Units Subcutaneous BID PC   [START ON 06/17/2024] multivitamin with minerals  1 tablet Oral Daily   pramipexole   0.25 mg Oral QHS   rosuvastatin   10 mg Oral QHS   sodium bicarbonate   650 mg Oral BID   tamsulosin   0.4 mg Oral Daily   Continuous Infusions:  [START ON 06/17/2024] cefTRIAXone  (ROCEPHIN )  IV     lactated ringers      Diet: Diet Order             Diet full liquid Room service appropriate? Yes; Fluid consistency: Thin  Diet effective now

## 2024-06-16 NOTE — ED Provider Notes (Addendum)
 [place] EMERGENCY DEPARTMENT AT [facility] Provider Note  CSN: [id number] Arrival date & time: 06/16/24 1130  Chief Complaint(s) Weakness and Wound Infection  HPI Jake Bartlett is a 83 y.o. male with past medical history as below, significant for DM, ICH, HLD, recent hip fracture right who presents to the ED with complaint of weakness, hypotension  Patient was hospitalized last month, he was sent to SNF afterwards and has recently been discharged back to home.  Patient presented to doctors office today with worsening weakness, difficulty performing ADLs, nausea vomiting diarrhea, poor p.o.  Patient reports he had nausea, vomiting, diarrhea over the past few days but since improved and he has been attempting to eat and drink since then.  When he arrived to PCP office he was noted to be hypotensive 79/51 and EMS was dispatched.  En route to the hospital blood pressure did improve without intervention.  There is also concern for wound to his right heel attributed to traction device used while he was hospitalized.  Denies fevers does have chills, intermittent abdominal discomfort, no longer having nausea vomiting diarrhea.  No chest pain or dyspnea.  No recent falls or head injury  Past Medical History Past Medical History:  Diagnosis Date   Diabetes mellitus without complication (HCC)    High cholesterol    Stroke [facility])    Patient Active Problem List   Diagnosis Date Noted   Severe sepsis (HCC) 06/16/2024   Sepsis secondary to UTI (HCC) 06/16/2024   Open wound of right heel 06/16/2024   ICH (intracerebral hemorrhage) (HCC) 04/12/2022   Pressure injury of skin 04/12/2022   Home Medication(s) Prior to Admission medications   Medication Sig Start Date End Date Taking? Authorizing Provider  acetaminophen  (TYLENOL ) 500 MG tablet Take 500 mg by mouth daily as needed for mild pain.    [provider]  amLODipine  (NORVASC ) 5 MG tablet Take 5 mg by mouth daily. 01/16/22    [provider]  carbidopa -levodopa  (SINEMET  IR) 25-250 MG tablet Take 1 tablet by mouth 2 (two) times daily. 01/09/22   [provider]  cefpodoxime  (VANTIN ) 200 MG tablet Take 1 tablet (200 mg total) by mouth 2 (two) times daily. 03/07/23   Patsey Lot, MD  escitalopram  (LEXAPRO ) 20 MG tablet Take 20 mg by mouth daily. 04/10/22   [provider]  folic acid  (FOLVITE ) 1 MG tablet Take 1 mg by mouth daily. 11/23/21   [provider]  insulin  NPH-regular Human (70-30) 100 UNIT/ML injection Inject 30 Units into the skin daily.    [provider]  Multiple Vitamin (MULTIVITAMIN) tablet Take 1 tablet by mouth daily.    [provider]  pramipexole  (MIRAPEX ) 0.125 MG tablet Take 1 tablet (0.125 mg total) by mouth 3 (three) times daily. 04/16/22   Leotis Bogus, MD  rosuvastatin  (CRESTOR ) 10 MG tablet Take 10 mg by mouth at bedtime. 01/16/22   [provider]  tamsulosin  (FLOMAX ) 0.4 MG CAPS capsule Take 1 capsule (0.4 mg total) by mouth daily. 02/24/22   Towana Ozell BROCKS, MD  Past Surgical History Past Surgical History:  Procedure Laterality Date   BACK SURGERY     HIP SURGERY     PENILE PROSTHESIS IMPLANT  08/14/2006   AMS Penile Prosthesis; Performed by MYRTIS Stalling MD   Family History History reviewed. No pertinent family history.  Social History Social History   Tobacco Use   Smoking status: Never   Smokeless tobacco: Never  Substance Use Topics   Alcohol use: No   Drug use: No   Allergies Gabapentin and Ropinirole  Review of Systems A thorough review of systems was obtained and all systems are negative except as noted in the HPI and PMH.   Physical Exam Vital Signs  I have reviewed the triage vital signs BP 134/61 (BP Location: Right Arm)   Pulse 81   Temp 97.6 F (36.4 C) (Oral)    Resp 18   SpO2 100%  Physical Exam Vitals and nursing note reviewed.  Constitutional:      General: He is not in acute distress.    Appearance: Normal appearance. He is well-developed. He is not ill-appearing.  HENT:     Head: Normocephalic and atraumatic.     Right Ear: External ear normal.     Left Ear: External ear normal.     Nose: Nose normal.     Mouth/Throat:     Mouth: Mucous membranes are dry.  Eyes:     General: No scleral icterus.       Right eye: No discharge.        Left eye: No discharge.  Cardiovascular:     Rate and Rhythm: Normal rate.  Pulmonary:     Effort: Pulmonary effort is normal. No respiratory distress.     Breath sounds: No stridor.  Abdominal:     General: Abdomen is flat. There is no distension.     Palpations: Abdomen is soft.     Tenderness: There is abdominal tenderness. There is no guarding.  Musculoskeletal:        General: No deformity.     Cervical back: No rigidity.     Comments: Wound to right heel, see photo No crepitance Malodorous    Skin:    General: Skin is warm and dry.     Coloration: Skin is not cyanotic, jaundiced or pale.  Neurological:     Mental Status: He is alert and oriented to person, place, and time.  Psychiatric:        Speech: Speech normal.        Behavior: Behavior normal. Behavior is cooperative.        ED Results and Treatments Labs (all labs ordered are listed, but only abnormal results are displayed) Labs Reviewed  CBC WITH DIFFERENTIAL/PLATELET - Abnormal; Notable for the following components:      Result Value   WBC 11.7 (*)    RBC 3.04 (*)    Hemoglobin 9.2 (*)    HCT 30.6 (*)    MCV 100.7 (*)    RDW 17.0 (*)    Platelets 505 (*)    Neutro Abs 8.6 (*)    Abs Immature Granulocytes 0.12 (*)    All other components within normal limits  COMPREHENSIVE METABOLIC PANEL WITH GFR - Abnormal; Notable for the following components:   CO2 19 (*)    Glucose, Bld 150 (*)    BUN 43 (*)    Creatinine,  Ser 2.85 (*)    Calcium  8.3 (*)    Total Protein 6.4 (*)  Albumin 2.7 (*)    Alkaline Phosphatase 158 (*)    GFR, Estimated 21 (*)    All other components within normal limits  URINALYSIS, ROUTINE W REFLEX MICROSCOPIC - Abnormal; Notable for the following components:   APPearance CLOUDY (*)    Hgb urine dipstick MODERATE (*)    Protein, ur 100 (*)    Nitrite POSITIVE (*)    Leukocytes,Ua LARGE (*)    Bacteria, UA MANY (*)    All other components within normal limits  I-STAT CG4 LACTIC ACID, ED - Abnormal; Notable for the following components:   Lactic Acid, Venous 2.7 (*)    All other components within normal limits  CULTURE, BLOOD (ROUTINE X 2)  CULTURE, BLOOD (ROUTINE X 2)  URINE CULTURE  LIPASE, BLOOD  I-STAT CG4 LACTIC ACID, ED                                                                                                                          Radiology DG Ankle 2 Views Right Result Date: 06/16/2024 CLINICAL DATA:  Repeat lateral image of the right ankle. EXAM: RIGHT ANKLE - 2 VIEW COMPARISON:  Same day radiographs of the right ankle dated 06/16/2024 at 12:13 p.m. FINDINGS: Soft tissue defect/wound overlying the posterior calcaneus. No radiographic evidence of acute osteolysis or erosive changes. Plantar calcaneal spur. IMPRESSION: Soft tissue defect/wound overlying the posterior calcaneus without radiographic evidence to suggest acute osteomyelitis at this time. Electronically Signed   By: Harrietta Sherry M.D.   On: 06/16/2024 14:35   CT ABDOMEN PELVIS WO CONTRAST Result Date: 06/16/2024 CLINICAL DATA:  Abdominal pain, nausea vomiting and diarrhea. EXAM: CT ABDOMEN AND PELVIS WITHOUT CONTRAST TECHNIQUE: Multidetector CT imaging of the abdomen and pelvis was performed following the standard protocol without IV contrast. RADIATION DOSE REDUCTION: This exam was performed according to the departmental dose-optimization program which includes automated exposure control, adjustment  of the mA and/or kV according to patient size and/or use of iterative reconstruction technique. COMPARISON:  CT abdomen pelvis dated 02/13/2023. FINDINGS: Evaluation of this exam is limited in the absence of intravenous contrast. Lower chest: Partially visualized small left pleural effusion. There is coronary vascular calcification. No intra-abdominal free air or free fluid. Hepatobiliary: Small cyst in the left lobe of the liver. No biliary dilatation. The gallbladder is unremarkable. Pancreas: Unremarkable. No pancreatic ductal dilatation or surrounding inflammatory changes. Spleen: Normal in size without focal abnormality. Adrenals/Urinary Tract: The adrenal glands unremarkable. No significant interval change in the left renal interpolar cyst. Tiny nonobstructing left renal calculi measure up to 2 mm. No hydronephrosis. There is no hydronephrosis or nephrolithiasis on the right. The visualized ureters appear unremarkable. There is slight thickened and irregular appearance of the left lateral bladder wall which may be related to underdistention. A urothelial neoplasm is not excluded. Further evaluation with cystoscopy is recommended. Stomach/Bowel: Significant interval increase in the size of the cecal mass seen on the prior CT filling of the cecum and measuring  approximately 4.3 x 8.0 cm (coronal 60/8). This is consistent with a colonic malignancy. Further evaluation with colonoscopy is recommended. There is sigmoid diverticulosis without active inflammatory changes. There is no bowel obstruction. The appendix is not visualized with certainty. No inflammatory changes identified in the right lower quadrant. For Vascular/Lymphatic: Moderate aortoiliac atherosclerotic disease. The IVC is unremarkable. No portal venous gas. There is no adenopathy. A subcentimeter lymph node posterior to the cecum and anterior to the right psoas muscle measures 6 mm in short axis slightly increased since the prior CT. Reproductive:  The prostate and seminal vesicles are grossly unremarkable. Penile implant with reservoir in the anterior pelvis. Other: None Musculoskeletal: Osteopenia with degenerative changes of the spine. Total right hip arthroplasty. No acute osseous pathology. IMPRESSION: 1. 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 Further evaluation with colonoscopy is recommended. 2. Sigmoid diverticulosis. No bowel obstruction. 3. Tiny nonobstructing left renal calculi. No hydronephrosis. 4. Slight thickened and irregular appearance of the left lateral bladder wall. A urothelial neoplasm is not excluded. Further evaluation with cystoscopy is recommended. 5.  Aortic Atherosclerosis (ICD10-I70.0). Electronically Signed   By: Vanetta Chou M.D.   On: 06/16/2024 14:00   DG Ankle Complete Right Result Date: 06/16/2024 EXAM: 3 or more VIEW(S) XRAY OF THE RIGHT ANKLE 06/16/2024 12:17:00 PM CLINICAL HISTORY: Weakness, wound infection - He was released from [facility], post rehab for broken hip recently. Since discharge, he has experienced weakness. He also had nausea, vomiting and diarrhea a few days ago. Pt has a wound on his right anterior ankle from; his leg being in traction from a recent right hip fracture. COMPARISON: None available. FINDINGS: BONES AND JOINTS: Calcaneal spur. No acute fracture. No joint dislocation. SOFT TISSUES: Mild diffuse soft tissue swelling. Soft tissue defect is noted posterior to the calcaneus with underlying focal area of osteopenia involving the posterior calcaneus. Note: this area is obscured by external artifact. If there is a focal wound in this area, I would suggest repeating the lateral projection radiograph of the ankle after removal of the external artifact to confirm presence of focal bone erosion which may reflect osteomyelitis of the calcaneus. IMPRESSION: 1. Soft tissue defect posterior to the calcaneus with underlying focal osteopenia,  obscured by external artifact. If a focal wound is present in this area, I would recommend repeat lateral radiograph after artifact removal to confirm possible bone erosion, which may reflect osteomyelitis. 2. Mild diffuse soft tissue swelling. Electronically signed by: Waddell Calk MD 06/16/2024 12:50 PM EDT RP Workstation: [id number]   DG Chest Portable 1 View Result Date: 06/16/2024 EXAM: 1 VIEW XRAY OF THE CHEST 06/16/2024 12:17:00 PM COMPARISON: 03/23/2024 CLINICAL HISTORY: Weakness, wound infection. Patient was released from [facility], post rehab for broken hip recently. Since discharge, he has experienced weakness. He also had nausea, vomiting and diarrhea a few days ago. Patient has a wound on his right anterior ankle from his leg being in traction from a recent right hip fracture. FINDINGS: LUNGS AND PLEURA: Low lung volumes with elevated left hemidiaphragm. Mild left lung base atelectasis. No focal pulmonary opacity. No pulmonary edema. No pleural effusion. No pneumothorax. HEART AND MEDIASTINUM: Aortic atherosclerosis. No acute abnormality of the cardiac and mediastinal silhouettes. BONES AND SOFT TISSUES: Remote healed left lateral rib fracture deformity. No acute osseous abnormality. IMPRESSION: 1. Low lung volumes with elevated left hemidiaphragm and mild left lung base atelectasis. Electronically signed by: Waddell Calk MD 06/16/2024 12:46 PM EDT  RP Workstation: [id number]    Pertinent labs & imaging results that were available during my care of the patient were reviewed by me and considered in my medical decision making (see MDM for details).  Medications Ordered in ED Medications  cefTRIAXone  (ROCEPHIN ) 1 g in sodium chloride  0.9 % 100 mL IVPB (1 g Intravenous New Bag/Given 06/16/24 1518)  carbidopa -levodopa  (SINEMET  IR) 25-250 MG per tablet immediate release 1 tablet (has no administration in time range)  escitalopram  (LEXAPRO ) tablet 20 mg (has no administration in time range)   multivitamin tablet 1 tablet (has no administration in time range)  pramipexole  (MIRAPEX ) tablet 0.125 mg (has no administration in time range)  rosuvastatin  (CRESTOR ) tablet 10 mg (has no administration in time range)  tamsulosin  (FLOMAX ) capsule 0.4 mg (has no administration in time range)  lactated ringers  bolus 1,000 mL (0 mLs Intravenous Stopped 06/16/24 1523)                                                                                                                                     Procedures Procedures  (including critical care time)  Medical Decision Making / ED Course    Medical Decision Making:    Mckenzie Toruno is a 83 y.o. male with past medical history as below, significant for DM, ICH, HLD, recent hip fracture right who presents to the ED with complaint of weakness, hypotension. The complaint involves an extensive differential diagnosis and also carries with it a high risk of complications and morbidity.  Serious etiology was considered. Ddx includes but is not limited to: dehydration, aki, infection, osteomyelitis, bowel obstruction, viral syndrome, pyelonephritis, etc  Complete initial physical exam performed, notably the patient was in nad.    Reviewed and confirmed nursing documentation for past medical history, family history, social history.  Vital signs reviewed.    UTI Hypotension Failure to thrive > - pt with recent diarrheal/emesis illness; recently improved.,  He has no flank pain.  Pyelonephritis seems less likely - Send urine culture, start Rocephin  - ?bladder neoplasm on imaging, see below  Wound to right ankle/heel > - Ulcer/wound associated with traction use while hospitalized - Wound to his heel does have some dark-colored tissue but appears to be wound dressing material that is adherent to his wound.  No crepitance, no gas on x-ray. - Continue ceftriaxone  - Wound care consulted  Cecal mass > - noted on imaging today, not seen on CT renal  5/23 - [facility] - consult gi here as being admitted, new dx - Dr Rosalie, see tomorrow    Left bladder wall abnormality> - Concern for possible neoplasm by radiology, rad recommend cysto - he f/w urology at [facility], seems to be new dx, consult uro here - consult uro, Dr Alvaro; will see in consult  Admit hospitalist                Additional history obtained: -  Additional history obtained from family -External records from outside source obtained and reviewed including: Chart review including previous notes, labs, imaging, consultation notes including  Documentation from recent hospitalization, prior imaging   Lab Tests: -I ordered, reviewed, and interpreted labs.   The pertinent results include:   Labs Reviewed  CBC WITH DIFFERENTIAL/PLATELET - Abnormal; Notable for the following components:      Result Value   WBC 11.7 (*)    RBC 3.04 (*)    Hemoglobin 9.2 (*)    HCT 30.6 (*)    MCV 100.7 (*)    RDW 17.0 (*)    Platelets 505 (*)    Neutro Abs 8.6 (*)    Abs Immature Granulocytes 0.12 (*)    All other components within normal limits  COMPREHENSIVE METABOLIC PANEL WITH GFR - Abnormal; Notable for the following components:   CO2 19 (*)    Glucose, Bld 150 (*)    BUN 43 (*)    Creatinine, Ser 2.85 (*)    Calcium  8.3 (*)    Total Protein 6.4 (*)    Albumin 2.7 (*)    Alkaline Phosphatase 158 (*)    GFR, Estimated 21 (*)    All other components within normal limits  URINALYSIS, ROUTINE W REFLEX MICROSCOPIC - Abnormal; Notable for the following components:   APPearance CLOUDY (*)    Hgb urine dipstick MODERATE (*)    Protein, ur 100 (*)    Nitrite POSITIVE (*)    Leukocytes,Ua LARGE (*)    Bacteria, UA MANY (*)    All other components within normal limits  I-STAT CG4 LACTIC ACID, ED - Abnormal; Notable for the following components:   Lactic Acid, Venous 2.7 (*)    All other components within normal limits  CULTURE, BLOOD (ROUTINE X 2)  CULTURE,  BLOOD (ROUTINE X 2)  URINE CULTURE  LIPASE, BLOOD  I-STAT CG4 LACTIC ACID, ED    Notable for elevated lactic acid, elevated WBC, creatinine similar  EKG   EKG Interpretation Date/Time:    Ventricular Rate:    PR Interval:    QRS Duration:    QT Interval:    QTC Calculation:   R Axis:      Text Interpretation:           Imaging Studies ordered: I ordered imaging studies including x-ray ankle, chest, CT abdomen pelvis I independently visualized the following imaging with scope of interpretation limited to determining acute life threatening conditions related to emergency care; findings noted above I agree with the radiologist interpretation If any imaging was obtained with contrast I closely monitored patient for any possible adverse reaction a/w contrast administration in the emergency department   Medicines ordered and prescription drug management: Meds ordered this encounter  Medications   lactated ringers  bolus 1,000 mL   cefTRIAXone  (ROCEPHIN ) 1 g in sodium chloride  0.9 % 100 mL IVPB    Antibiotic Indication::   UTI   carbidopa -levodopa  (SINEMET  IR) 25-250 MG per tablet immediate release 1 tablet   escitalopram  (LEXAPRO ) tablet 20 mg   multivitamin tablet 1 tablet   pramipexole  (MIRAPEX ) tablet 0.125 mg   rosuvastatin  (CRESTOR ) tablet 10 mg   tamsulosin  (FLOMAX ) capsule 0.4 mg    -I have reviewed the patients home medicines and have made adjustments as needed   Consultations Obtained: I requested consultation with the hospitalist, GI,  and discussed lab and imaging findings as well as pertinent plan   Cardiac Monitoring: The patient was maintained on a  cardiac monitor.  I personally viewed and interpreted the cardiac monitored which showed an underlying rhythm of: nsr Continuous pulse oximetry interpreted by myself, 100% on RA.    Social Determinants of Health:  Diagnosis or treatment significantly limited by social determinants of health:  na   Reevaluation: After the interventions noted above, I reevaluated the patient and found that they have improved  Co morbidities that complicate the patient evaluation  Past Medical History:  Diagnosis Date   Diabetes mellitus without complication (HCC)    High cholesterol    Stroke [facility])       Dispostion: Disposition decision including need for hospitalization was considered, and patient admitted to the hospital.    Final Clinical Impression(s) / ED Diagnoses Final diagnoses:  Acute cystitis with hematuria  Colonic mass  Hypotension, unspecified hypotension type  Failure to thrive in adult  Wound of right ankle, initial encounter        Elnor Jayson LABOR, DO 06/16/24 1534    Elnor Jayson LABOR, DO 06/16/24 1535

## 2024-06-17 ENCOUNTER — Encounter (HOSPITAL_COMMUNITY): Payer: Self-pay | Admitting: Internal Medicine

## 2024-06-17 DIAGNOSIS — R6520 Severe sepsis without septic shock: Secondary | ICD-10-CM | POA: Diagnosis not present

## 2024-06-17 DIAGNOSIS — A419 Sepsis, unspecified organism: Secondary | ICD-10-CM | POA: Diagnosis not present

## 2024-06-17 LAB — BASIC METABOLIC PANEL WITH GFR
Anion gap: 12 (ref 5–15)
BUN: 40 mg/dL — ABNORMAL HIGH (ref 8–23)
CO2: 21 mmol/L — ABNORMAL LOW (ref 22–32)
Calcium: 7.6 mg/dL — ABNORMAL LOW (ref 8.9–10.3)
Chloride: 106 mmol/L (ref 98–111)
Creatinine, Ser: 2.6 mg/dL — ABNORMAL HIGH (ref 0.61–1.24)
GFR, Estimated: 24 mL/min — ABNORMAL LOW (ref >60)
Glucose, Bld: 109 mg/dL — ABNORMAL HIGH (ref 70–99)
Potassium: 4.3 mmol/L (ref 3.5–5.1)
Sodium: 138 mmol/L (ref 135–145)

## 2024-06-17 LAB — CBC
HCT: 24.5 % — ABNORMAL LOW (ref 39.0–52.0)
Hemoglobin: 7.5 g/dL — ABNORMAL LOW (ref 13.0–17.0)
MCH: 31.1 pg (ref 26.0–34.0)
MCHC: 30.6 g/dL (ref 30.0–36.0)
MCV: 101.7 fL — ABNORMAL HIGH (ref 80.0–100.0)
Platelets: 380 K/uL (ref 150–400)
RBC: 2.41 MIL/uL — ABNORMAL LOW (ref 4.22–5.81)
RDW: 16.8 % — ABNORMAL HIGH (ref 11.5–15.5)
WBC: 8.8 K/uL (ref 4.0–10.5)
nRBC: 0 % (ref 0.0–0.2)

## 2024-06-17 LAB — URINE CULTURE

## 2024-06-17 LAB — GLUCOSE, CAPILLARY
Glucose-Capillary: 137 mg/dL — ABNORMAL HIGH (ref 70–99)
Glucose-Capillary: 103 mg/dL — ABNORMAL HIGH (ref 70–99)
Glucose-Capillary: 169 mg/dL — ABNORMAL HIGH (ref 70–99)
Glucose-Capillary: 107 mg/dL — ABNORMAL HIGH (ref 70–99)

## 2024-06-17 MED ORDER — DOCUSATE SODIUM 100 MG PO CAPS: 100.0000 mg | ORAL_CAPSULE | Freq: Two times a day (BID) | ORAL | Status: DC

## 2024-06-17 MED ORDER — POLYETHYLENE GLYCOL 3350 17 G PO PACK
17.0000 g | PACK | Freq: Every day | ORAL | Status: DC
  Administered 2024-06-19 – 2024-06-21 (×2): 17 g via ORAL
  Filled 2024-06-17 (×3): qty 1

## 2024-06-17 MED ORDER — GERHARDT'S BUTT CREAM
TOPICAL_CREAM | Freq: Three times a day (TID) | CUTANEOUS | Status: DC
  Administered 2024-06-20 – 2024-06-21 (×2): 1 via TOPICAL
  Filled 2024-06-17: qty 60

## 2024-06-17 MED ORDER — MEDIHONEY WOUND/BURN DRESSING EX PSTE
1.0000 | PASTE | Freq: Every day | CUTANEOUS | Status: DC
  Administered 2024-06-17 – 2024-06-25 (×8): 1 via TOPICAL
  Filled 2024-06-17: qty 44

## 2024-06-17 NOTE — TOC Initial Note (Signed)
 Transition of Care [facility]) - Initial/Assessment Note    Patient Details  Name: Jake Bartlett MRN: [id number] Date of Birth: 1941/01/20  [facility]) CM/SW Contact:    Bascom Service, RN Phone Number: 06/17/2024, 4:03 PM  Clinical Narrative: Recent rehab stay. From home. Await PT recc.                  Expected Discharge Plan: Skilled Nursing Facility Barriers to Discharge: Continued Medical Work up   Patient Goals and CMS Choice   CMS Medicare.gov Compare Post Acute Care list provided to:: Patient Represenative (must comment) ([name](spouse))        Expected Discharge Plan and Services     Post Acute Care Choice: Skilled Nursing Facility Living arrangements for the past 2 months: Single Family Home                                      Prior Living Arrangements/Services Living arrangements for the past 2 months: Single Family Home Lives with:: Spouse                   Activities of Daily Living   ADL Screening (condition at time of admission) Independently performs ADLs?: No Does the patient have a NEW difficulty with bathing/dressing/toileting/self-feeding that is expected to last >3 days?: No Does the patient have a NEW difficulty with getting in/out of bed, walking, or climbing stairs that is expected to last >3 days?: No Does the patient have a NEW difficulty with communication that is expected to last >3 days?: No Is the patient deaf or have difficulty hearing?: Yes (hearing aids not at bedside) Does the patient have difficulty seeing, even when wearing glasses/contacts?: No Does the patient have difficulty concentrating, remembering, or making decisions?: No  Permission Sought/Granted                  Emotional Assessment              Admission diagnosis:  Failure to thrive in adult [R62.7] Acute cystitis with hematuria [N30.01] 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] Severe sepsis (HCC) [A41.9, R65.20] Wound of right ankle, initial  encounter [S91.001A] Hypotension, unspecified hypotension type [I95.9] Patient Active Problem List   Diagnosis Date Noted   Severe sepsis (HCC) 06/16/2024   Sepsis secondary to UTI (HCC) 06/16/2024   Open wound of right heel 06/16/2024   ICH (intracerebral hemorrhage) (HCC) 04/12/2022   Pressure injury of skin 04/12/2022   PCP:  Alyse Bradley, MD Pharmacy:   [facility] [phone] - [place], [place] - [address] AT [facility] [address] [place] [address] Phone: [phone] Fax: [phone]  [place] [facility] [address] [place] [address] Phone: [phone] Fax: [phone]     Social Drivers of Health (SDOH) Social History: SDOH Screenings   Food Insecurity: No Food Insecurity (06/16/2024)  Housing: Low Risk  (06/16/2024)  Transportation Needs: No Transportation Needs (06/16/2024)  Utilities: Not At Risk (06/16/2024)  Financial Resource Strain: Low Risk  (12/05/2023)   Received from [facility]  Physical Activity: Insufficiently Active (06/24/2023)   Received from [facility]  Social Connections: Moderately Integrated (06/16/2024)  Stress: No Stress Concern Present (06/24/2023)   Received from [facility]  Tobacco Use: Low Risk  (06/17/2024)   SDOH Interventions:     Readmission Risk Interventions     No data  to display

## 2024-06-17 NOTE — Consult Note (Signed)
 Reason for Consult: Abnormal CT Referring Physician: Hospital team  Jake Bartlett is an 83 y.o. male.  HPI: Patient seen and examined and his hospital computer chart including care everywhere was reviewed and he had a poor prep colonoscopy last year but unfortunately they did not get to the cecal pole based on the report and he had a CT scan last year which did show a cecal abnormality but he was never told about that and nothing was done about that as far as he knew and now he had a CT scan for his UTI which has a worrisome lesion in the cecum and other than some chronic constipation he has no GI complaints and has not seen any blood and requests stool softeners we briefly discussed a another colonoscopy to be sure  Past Medical History:  Diagnosis Date   Diabetes mellitus without complication (HCC)    High cholesterol    Stroke [facility])     Past Surgical History:  Procedure Laterality Date   BACK SURGERY     HIP SURGERY     PENILE PROSTHESIS IMPLANT  08/14/2006   AMS Penile Prosthesis; Performed by MYRTIS Stalling MD    History reviewed. No pertinent family history.  Social History:  reports that he has never smoked. He has never used smokeless tobacco. He reports that he does not drink alcohol and does not use drugs.  Allergies:  Allergies  Allergen Reactions   Ropinirole Anaphylaxis, Swelling and Other (See Comments)    Angioedema   Gabapentin Other (See Comments)    Drowsiness- Made him extremely sleepy - even a low dose    Medications: I have reviewed the patient's current medications.  Results for orders placed or performed during the hospital encounter of 06/16/24 (from the past 48 hours)  CBC with Differential     Status: Abnormal   Collection Time: 06/16/24 12:08 PM  Result Value Ref Range   WBC 11.7 (H) 4.0 - 10.5 K/uL   RBC 3.04 (L) 4.22 - 5.81 MIL/uL   Hemoglobin 9.2 (L) 13.0 - 17.0 g/dL   HCT 69.3 (L) 60.9 - 47.9 %   MCV 100.7 (H) 80.0 - 100.0 fL   MCH 30.3 26.0 -  34.0 pg   MCHC 30.1 30.0 - 36.0 g/dL   RDW 82.9 (H) 88.4 - 84.4 %   Platelets 505 (H) 150 - 400 K/uL   nRBC 0.0 0.0 - 0.2 %   Neutrophils Relative % 73 %   Neutro Abs 8.6 (H) 1.7 - 7.7 K/uL   Lymphocytes Relative 16 %   Lymphs Abs 1.9 0.7 - 4.0 K/uL   Monocytes Relative 7 %   Monocytes Absolute 0.8 0.1 - 1.0 K/uL   Eosinophils Relative 2 %   Eosinophils Absolute 0.3 0.0 - 0.5 K/uL   Basophils Relative 1 %   Basophils Absolute 0.1 0.0 - 0.1 K/uL   Immature Granulocytes 1 %   Abs Immature Granulocytes 0.12 (H) 0.00 - 0.07 K/uL    Comment: Performed at [facility], [address] [place], [place] [address]  Comprehensive metabolic panel     Status: Abnormal   Collection Time: 06/16/24 12:08 PM  Result Value Ref Range   Sodium 137 135 - 145 mmol/L   Potassium 4.7 3.5 - 5.1 mmol/L   Chloride 103 98 - 111 mmol/L   CO2 19 (L) 22 - 32 mmol/L   Glucose, Bld 150 (H) 70 - 99 mg/dL    Comment: Glucose reference range applies  only to samples taken after fasting for at least 8 hours.   BUN 43 (H) 8 - 23 mg/dL   Creatinine, Ser 7.14 (H) 0.61 - 1.24 mg/dL   Calcium  8.3 (L) 8.9 - 10.3 mg/dL   Total Protein 6.4 (L) 6.5 - 8.1 g/dL   Albumin 2.7 (L) 3.5 - 5.0 g/dL   AST 25 15 - 41 U/L   ALT <5 0 - 44 U/L   Alkaline Phosphatase 158 (H) 38 - 126 U/L   Total Bilirubin 0.4 0.0 - 1.2 mg/dL   GFR, Estimated 21 (L) >60 mL/min    Comment: (NOTE) Calculated using the CKD-EPI Creatinine Equation (2021)    Anion gap 15 5 - 15    Comment: Performed at [facility], [address], [place], [place] [address]  Lipase, blood     Status: None   Collection Time: 06/16/24 12:08 PM  Result Value Ref Range   Lipase 37 11 - 51 U/L    Comment: Performed at [facility], [address] [place], [place] [address]  I-Stat CG4 Lactic Acid     Status: Abnormal   Collection Time: 06/16/24 12:15 PM  Result Value Ref Range   Lactic Acid, Venous 2.7 (HH) 0.5  - 1.9 mmol/L   Comment NOTIFIED PHYSICIAN   Urinalysis, Routine w reflex microscopic -Urine, Clean Catch     Status: Abnormal   Collection Time: 06/16/24  2:05 PM  Result Value Ref Range   Color, Urine YELLOW YELLOW   APPearance CLOUDY (A) CLEAR   Specific Gravity, Urine 1.011 1.005 - 1.030   pH 5.0 5.0 - 8.0   Glucose, UA NEGATIVE NEGATIVE mg/dL   Hgb urine dipstick MODERATE (A) NEGATIVE   Bilirubin Urine NEGATIVE NEGATIVE   Ketones, ur NEGATIVE NEGATIVE mg/dL   Protein, ur 899 (A) NEGATIVE mg/dL   Nitrite POSITIVE (A) NEGATIVE   Leukocytes,Ua LARGE (A) NEGATIVE   RBC / HPF 11-20 0 - 5 RBC/hpf   WBC, UA >50 0 - 5 WBC/hpf   Bacteria, UA MANY (A) NONE SEEN   Squamous Epithelial / HPF 0-5 0 - 5 /HPF   WBC Clumps PRESENT     Comment: Performed at [facility], [address], [place], [place] [address]  MRSA Next Gen by PCR, Nasal     Status: None   Collection Time: 06/16/24  8:36 PM   Specimen: Urine, Clean Catch; Nasal Swab  Result Value Ref Range   MRSA by PCR Next Gen NOT DETECTED NOT DETECTED    Comment: (NOTE) The GeneXpert MRSA Assay (FDA approved for NASAL specimens only), is one component of a comprehensive MRSA colonization surveillance program. It is not intended to diagnose MRSA infection nor to guide or monitor treatment for MRSA infections. Test performance is not FDA approved in patients less than 55 years old. Performed at [facility], [address], [place], [place] [address]   Glucose, capillary     Status: Abnormal   Collection Time: 06/16/24 10:45 PM  Result Value Ref Range   Glucose-Capillary 115 (H) 70 - 99 mg/dL    Comment: Glucose reference range applies only to samples taken after fasting for at least 8 hours.  Basic metabolic panel     Status: Abnormal   Collection Time: 06/17/24  5:04 AM  Result Value Ref Range   Sodium 138 135 - 145 mmol/L   Potassium 4.3 3.5 - 5.1 mmol/L   Chloride 106 98 - 111  mmol/L    CO2 21 (L) 22 - 32 mmol/L   Glucose, Bld 109 (H) 70 - 99 mg/dL    Comment: Glucose reference range applies only to samples taken after fasting for at least 8 hours.   BUN 40 (H) 8 - 23 mg/dL   Creatinine, Ser 7.39 (H) 0.61 - 1.24 mg/dL   Calcium  7.6 (L) 8.9 - 10.3 mg/dL   GFR, Estimated 24 (L) >60 mL/min    Comment: (NOTE) Calculated using the CKD-EPI Creatinine Equation (2021)    Anion gap 12 5 - 15    Comment: Performed at [facility], [address] [place], [place] [address]  CBC     Status: Abnormal   Collection Time: 06/17/24  5:04 AM  Result Value Ref Range   WBC 8.8 4.0 - 10.5 K/uL   RBC 2.41 (L) 4.22 - 5.81 MIL/uL   Hemoglobin 7.5 (L) 13.0 - 17.0 g/dL   HCT 75.4 (L) 60.9 - 47.9 %   MCV 101.7 (H) 80.0 - 100.0 fL   MCH 31.1 26.0 - 34.0 pg   MCHC 30.6 30.0 - 36.0 g/dL   RDW 83.1 (H) 88.4 - 84.4 %   Platelets 380 150 - 400 K/uL   nRBC 0.0 0.0 - 0.2 %    Comment: Performed at [facility], [address] [place], [place] [address]  Glucose, capillary     Status: Abnormal   Collection Time: 06/17/24  7:20 AM  Result Value Ref Range   Glucose-Capillary 137 (H) 70 - 99 mg/dL    Comment: Glucose reference range applies only to samples taken after fasting for at least 8 hours.    DG Ankle 2 Views Right Result Date: 06/16/2024 CLINICAL DATA:  Repeat lateral image of the right ankle. EXAM: RIGHT ANKLE - 2 VIEW COMPARISON:  Same day radiographs of the right ankle dated 06/16/2024 at 12:13 p.m. FINDINGS: Soft tissue defect/wound overlying the posterior calcaneus. No radiographic evidence of acute osteolysis or erosive changes. Plantar calcaneal spur. IMPRESSION: Soft tissue defect/wound overlying the posterior calcaneus without radiographic evidence to suggest acute osteomyelitis at this time. Electronically Signed   By: Harrietta Sherry M.D.   On: 06/16/2024 14:35   CT ABDOMEN PELVIS WO CONTRAST Result Date: 06/16/2024 CLINICAL DATA:   Abdominal pain, nausea vomiting and diarrhea. EXAM: CT ABDOMEN AND PELVIS WITHOUT CONTRAST TECHNIQUE: Multidetector CT imaging of the abdomen and pelvis was performed following the standard protocol without IV contrast. RADIATION DOSE REDUCTION: This exam was performed according to the departmental dose-optimization program which includes automated exposure control, adjustment of the mA and/or kV according to patient size and/or use of iterative reconstruction technique. COMPARISON:  CT abdomen pelvis dated 02/13/2023. FINDINGS: Evaluation of this exam is limited in the absence of intravenous contrast. Lower chest: Partially visualized small left pleural effusion. There is coronary vascular calcification. No intra-abdominal free air or free fluid. Hepatobiliary: Small cyst in the left lobe of the liver. No biliary dilatation. The gallbladder is unremarkable. Pancreas: Unremarkable. No pancreatic ductal dilatation or surrounding inflammatory changes. Spleen: Normal in size without focal abnormality. Adrenals/Urinary Tract: The adrenal glands unremarkable. No significant interval change in the left renal interpolar cyst. Tiny nonobstructing left renal calculi measure up to 2 mm. No hydronephrosis. There is no hydronephrosis or nephrolithiasis on the right. The visualized ureters appear unremarkable. There is slight thickened and irregular appearance of the left lateral bladder wall which may be related to underdistention. A urothelial neoplasm is not excluded.  Further evaluation with cystoscopy is recommended. Stomach/Bowel: Significant interval increase in the size of the cecal mass seen on the prior CT filling of the cecum and measuring approximately 4.3 x 8.0 cm (coronal 60/8). This is consistent with a colonic malignancy. Further evaluation with colonoscopy is recommended. There is sigmoid diverticulosis without active inflammatory changes. There is no bowel obstruction. The appendix is not visualized with  certainty. No inflammatory changes identified in the right lower quadrant. For Vascular/Lymphatic: Moderate aortoiliac atherosclerotic disease. The IVC is unremarkable. No portal venous gas. There is no adenopathy. A subcentimeter lymph node posterior to the cecum and anterior to the right psoas muscle measures 6 mm in short axis slightly increased since the prior CT. Reproductive: The prostate and seminal vesicles are grossly unremarkable. Penile implant with reservoir in the anterior pelvis. Other: None Musculoskeletal: Osteopenia with degenerative changes of the spine. Total right hip arthroplasty. No acute osseous pathology. IMPRESSION: 1. 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 Further evaluation with colonoscopy is recommended. 2. Sigmoid diverticulosis. No bowel obstruction. 3. Tiny nonobstructing left renal calculi. No hydronephrosis. 4. Slight thickened and irregular appearance of the left lateral bladder wall. A urothelial neoplasm is not excluded. Further evaluation with cystoscopy is recommended. 5.  Aortic Atherosclerosis (ICD10-I70.0). Electronically Signed   By: Vanetta Chou M.D.   On: 06/16/2024 14:00   DG Ankle Complete Right Result Date: 06/16/2024 EXAM: 3 or more VIEW(S) XRAY OF THE RIGHT ANKLE 06/16/2024 12:17:00 PM CLINICAL HISTORY: Weakness, wound infection - He was released from [facility], post rehab for broken hip recently. Since discharge, he has experienced weakness. He also had nausea, vomiting and diarrhea a few days ago. Pt has a wound on his right anterior ankle from; his leg being in traction from a recent right hip fracture. COMPARISON: None available. FINDINGS: BONES AND JOINTS: Calcaneal spur. No acute fracture. No joint dislocation. SOFT TISSUES: Mild diffuse soft tissue swelling. Soft tissue defect is noted posterior to the calcaneus with underlying focal area of osteopenia involving the posterior calcaneus.  Note: this area is obscured by external artifact. If there is a focal wound in this area, I would suggest repeating the lateral projection radiograph of the ankle after removal of the external artifact to confirm presence of focal bone erosion which may reflect osteomyelitis of the calcaneus. IMPRESSION: 1. Soft tissue defect posterior to the calcaneus with underlying focal osteopenia, obscured by external artifact. If a focal wound is present in this area, I would recommend repeat lateral radiograph after artifact removal to confirm possible bone erosion, which may reflect osteomyelitis. 2. Mild diffuse soft tissue swelling. Electronically signed by: Waddell Calk MD 06/16/2024 12:50 PM EDT RP Workstation: [id number]   DG Chest Portable 1 View Result Date: 06/16/2024 EXAM: 1 VIEW XRAY OF THE CHEST 06/16/2024 12:17:00 PM COMPARISON: 03/23/2024 CLINICAL HISTORY: Weakness, wound infection. Patient was released from [facility], post rehab for broken hip recently. Since discharge, he has experienced weakness. He also had nausea, vomiting and diarrhea a few days ago. Patient has a wound on his right anterior ankle from his leg being in traction from a recent right hip fracture. FINDINGS: LUNGS AND PLEURA: Low lung volumes with elevated left hemidiaphragm. Mild left lung base atelectasis. No focal pulmonary opacity. No pulmonary edema. No pleural effusion. No pneumothorax. HEART AND MEDIASTINUM: Aortic atherosclerosis. No acute abnormality of the cardiac and mediastinal silhouettes. BONES AND SOFT TISSUES: Remote healed left lateral rib fracture deformity. No  acute osseous abnormality. IMPRESSION: 1. Low lung volumes with elevated left hemidiaphragm and mild left lung base atelectasis. Electronically signed by: Waddell Calk MD 06/16/2024 12:46 PM EDT RP Workstation: [id number]    ROS negative except above Blood pressure (!) 129/56, pulse 72, temperature 98 F (36.7 C), resp. rate 16, height 5' 8 (1.727 m),  weight 78.9 kg, SpO2 98%. Physical Exam vital signs stable afebrile no acute distress abdomen is soft nontender CT reviewed BUN and creatinine elevated but may be chronic hemoglobin slight drop with hydration and his anemia is chronic based on care everywhere  Assessment/Plan: Multiple medical problems including cecal mass on CT scan Plan: Please call us  back when you think he can tolerate the prep and is ready for colonoscopy and I have discussed inpatient colonoscopy versus [facility] for further discussion however if he is not a surgical candidate and if we are looking into palliative care possibly no further workup for this should be done but we will be on standby to help however you and the family decide to proceed  Reha Martinovich E 06/17/2024, 10:44 AM

## 2024-06-17 NOTE — Consult Note (Signed)
 WOC Nurse Consult Note: Reason for Consult: sacral and buttocks wounds  Wound type: 1.  Unstageable Pressure Injury R ischium 60% tan necrotic 40% red moist  2.  Stage 1 Pressure Injury Sacrum  3.  Moisture Associated Skin Damage to sacrum/coccyx/buttocks with partial thickness skin loss  Pressure Injury POA: Yes Measurement: see nursing flowsheet  Wound bed: as above  Drainage (amount, consistency, odor) see nursing flowsheet  Periwound: erythema, peeling skin  Dressing procedure/placement/frequency: Cleanse sacrum/coccyx/buttocks with Vashe wound cleanser Soila [phone]), do not rinse and allow to air dry. Apply Gerhardt's Butt Cream 3 times a day and prn soiling.  Cleanse R ischial wound with Vashe, do not rinse and allow to air dry. Apply Medihoney to wound bed daily, fill in wound with dry gauze and secure with silicone foam.    POC discussed with bedside nurse WOC team will not follow. Re-consult if further needs arise.   Thank you,    Powell Bar MSN, RN-BC, [employer]

## 2024-06-17 NOTE — Evaluation (Signed)
 Occupational Therapy Evaluation Patient Details Name: Jake Bartlett MRN: [id number] DOB: 1941/06/29 Today's Date: 06/17/2024   History of Present Illness   83 year old male was sent from PCP office due to concern for infection and weakness. 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 Pt reports being home for 2 days from SNF before returning to hospital. Pt lives with wife who is available but limited with ability to physically assist Pt, son works during the day and lives down the road. PLOF Pt reports mostly bed level and gets into w/c sometimes, states he can stand pivot but refused to perform OOB activities today. Pt total A to scoot back in bed, was not able to sit up or reach for items not in immediate area. Pt set up for feeding, able to self feed, continued to refuse getting to EOB. PT would benefit from continued acute OT to maximize functional strength and ADL participation, at this time recommending postacute rehab <3hrs/day as Pt reports he does not have enough help at home and cannot take care of himself, yet regardless of this info Pt wishes to return home.      If plan is discharge home, recommend the following:   A lot of help with walking and/or transfers;A lot of help with bathing/dressing/bathroom;Assistance with cooking/housework;Assistance with feeding;Direct supervision/assist for medications management;Assist for transportation     Functional Status Assessment   Patient has had a recent decline in their functional status and demonstrates the ability to make significant improvements  in function in a reasonable and predictable amount of time.     Equipment Recommendations   Other (comment) (TBD)     Recommendations for Other Services         Precautions/Restrictions   Precautions Precautions: Fall Recall of Precautions/Restrictions: Intact Restrictions Weight Bearing Restrictions Per Provider Order: No     Mobility Bed Mobility               General bed mobility comments: refused, total x2 for scooting back in bed    Transfers                   General transfer comment: refused      Balance                                           ADL either performed or assessed with clinical judgement   ADL Overall ADL's : Needs assistance/impaired Eating/Feeding: Set up;Bed level   Grooming: Minimal assistance                                 General ADL Comments: Pt agreeable to eat meal in bed, refused EOB ADLs, needs help with all ADLs at baseline     Vision Baseline Vision/History: 1 Wears glasses Ability to See in Adequate Light: 0 Adequate Patient Visual Report: No change from baseline       Perception         Praxis  Pertinent Vitals/Pain Pain Assessment Pain Assessment: No/denies pain     Extremity/Trunk Assessment     Lower Extremity Assessment Lower Extremity Assessment: Defer to PT evaluation       Communication Communication Communication: Impaired Factors Affecting Communication: Hearing impaired   Cognition Arousal: Alert Behavior During Therapy: WFL for tasks assessed/performed Cognition: No apparent impairments                               Following commands: Intact       Cueing  General Comments   Cueing Techniques: Verbal cues      Exercises     Shoulder Instructions      Home Living Family/patient expects to be discharged to:: Private residence Living Arrangements: Spouse/significant other Available Help at Discharge:  Family;Available 24 hours/day Type of Home: House Home Access: Level entry     Home Layout: One level     Bathroom Shower/Tub: Walk-in shower         Home Equipment: Agricultural consultant (2 wheels);BSC/3in1;Shower seat;Wheelchair - manual;Cane - single point   Additional Comments: Pt lives with wife, son lives down the street      Prior Functioning/Environment Prior Level of Function : Needs assist             Mobility Comments: Pt home for a couple days after [employer], feeling weak, mainly in bed, using w/c when OOB ADLs Comments: help with all ADLs    OT Problem List: Decreased strength;Decreased range of motion;Decreased activity tolerance;Impaired balance (sitting and/or standing);Decreased safety awareness;Increased edema   OT Treatment/Interventions: Self-care/ADL training;Therapeutic exercise;Energy conservation;DME and/or AE instruction;Therapeutic activities;Patient/family education;Balance training      OT Goals(Current goals can be found in the care plan section)   Acute Rehab OT Goals Patient Stated Goal: to return home OT Goal Formulation: With patient Time For Goal Achievement: 07/01/24 Potential to Achieve Goals: Fair   OT Frequency:  Min 2X/week    Co-evaluation              AM-PAC OT 6 Clicks Daily Activity     Outcome Measure Help from another person eating meals?: A Little Help from another person taking care of personal grooming?: A Little Help from another person toileting, which includes using toliet, bedpan, or urinal?: Total Help from another person bathing (including washing, rinsing, drying)?: A Lot Help from another person to put on and taking off regular upper body clothing?: A Lot Help from another person to put on and taking off regular lower body clothing?: Total 6 Click Score: 12   End of Session Nurse Communication: Mobility status  Activity Tolerance: Patient limited by fatigue Patient left: in bed;with call  bell/phone within reach;with bed alarm set  OT Visit Diagnosis: Unsteadiness on feet (R26.81);Other abnormalities of gait and mobility (R26.89);Muscle weakness (generalized) (M62.81);History of falling (Z91.81)                Time: 9054-8992 OT Time Calculation (min): 22 min Charges:  OT Evaluation $OT Eval Moderate Complexity: 1 [address], OTR/L   Elouise JONELLE Bott 06/17/2024, 10:13 AM

## 2024-06-17 NOTE — Progress Notes (Signed)
 PROGRESS NOTE  Jake Bartlett  FMW:[id number] DOB: July 11, 1941 DOA: 06/16/2024 PCP: Alyse Bradley, MD   Brief Narrative: Patient is a 83 year old male with history of CKD stage IV with baseline creatinine around 2.8, chronic normocytic anemia with baseline hemoglobin around 8-10, type 2 diabetes, hypertension, right thalamic ICH with left-sided weakness, Parkinson disease, recent right total hip replacement revision who was sent to the emergency department for the evaluation of weakness, concern for infection.  On presentation, he was hypotensive.  Lab work showed creatinine of 2.8, lactate of 2.7, WBC count of 9.7.  UA was suspicious for UTI, started on ceftriaxone .  X-ray of the right ankle showed soft tissue defect/wound overlying the posterior calcaneus without evidence of osteomyelitis, wound care consulted.  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  GI consulted.  Assessment & Plan:  Principal Problem:   Severe sepsis (HCC) Active Problems:   Sepsis secondary to UTI (HCC)   Open wound of right heel  Severe sepsis secondary to UTI: Presented with hypotension, elevated lactate.  UA suspicious for UTI.  Started on ceftriaxone .  Follow-up urine culture, blood culture.  Previously had Klebsiella UTI.  Currently hemodynamically stable with a stable blood pressure.  No leukocytosis or fever this morning  Right heel wound: X-ray did not show any osteomyelitis.  Wound care consulted.  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  GI consulted.  May need colonoscopy for biopsy. CT also showed slight thickened and irregular appearance of the left lateral bladder wall. A urothelial neoplasm is not excluded.  Recommend further evaluation with cystoscopy.  We recommend to follow-up with urology as an  outpatient.  CKD stage IV/metabolic acidosis: Started on gentle iv fluid.  Kidney function at baseline.  Added sodium bicarb tabs.  Baseline creatinine around 2.8.  Currently kidney function at baseline  Chronic normocytic anemia: Baseline Hb in the range of 8-10.  Hemoglobin dropped to the range of 7.5.  Could be from occult GI bleed.  GI already following.  No report of hematochezia or melena..  Type 2 diabetes: On 70/30 insulin  at home.  Continue current insulin  regimen.  Monitor blood sugars  History of hypertension: Hypotensive on presentation.  Coreg on hold.  Currently normotensive  Debility/deconditioning/hip replacement on right/right thalamic ICH with left-sided weakness/Parkinson disease: Lives with family.  Found to be significantly weak than baseline.  PT/OT evaluation requested.  Continue Sinemet .  May need SNF on discharge         DVT prophylaxis:enoxaparin  (LOVENOX ) injection 30 mg Start: 06/17/24 1000 SCDs Start: 06/16/24 1711     Code Status: Full Code  Family Communication: Called and discussed with wife [name] on phone on 8/28  Patient status:Inpatient  Patient is from :Home  Anticipated discharge un:[facility] vs SnF  Estimated DC date:2-3 days   Consultants: GI  Procedures:None yet  Antimicrobials:  Anti-infectives (From admission, onward)    Start     Dose/Rate Route Frequency Ordered Stop   06/17/24 1500  cefTRIAXone  (ROCEPHIN ) 2 g in sodium chloride  0.9 % 100 mL IVPB        2 g 200 mL/hr over 30 Minutes Intravenous Every 24 hours 06/16/24 1710 06/22/24 1459   06/16/24 1500  cefTRIAXone  (ROCEPHIN ) 1 g in sodium chloride  0.9 % 100 mL IVPB        1 g 200 mL/hr over 30 Minutes Intravenous  Once 06/16/24 1447 06/16/24 1548       Subjective: Patient seen and examined at bedside today.  Hemodynamically stable.  Lying on bed.  Overall alert and oriented.  Looks weak and deconditioned.  Complains of lower abdominal discomfort but does not appear to be in  distress.  Abdomen is soft, mostly nontender, nondistended with good bowel sounds.  Wound care saw him for the right heel wound today.  He is afebrile, blood pressure stable this morning  Objective: Vitals:   06/16/24 1931 06/16/24 2150 06/17/24 0211 06/17/24 0556  BP:  119/60 117/68 (!) 129/56  Pulse:  72 71 72  Resp:  18 16 16   Temp:  98.5 F (36.9 C) 98.2 F (36.8 C) 98 F (36.7 C)  TempSrc:   Oral   SpO2:  100% 98% 98%  Weight: 78.9 kg     Height: 5' 8 (1.727 m)       Intake/Output Summary (Last 24 hours) at 06/17/2024 0821 Last data filed at 06/17/2024 0300 Gross per 24 hour  Intake 2082.43 ml  Output --  Net 2082.43 ml   Filed Weights   06/16/24 1931  Weight: 78.9 kg    Examination:  General exam: Overall comfortable, not in distress, very deconditioned, weak HEENT: PERRL Respiratory system:  no wheezes or crackles  Cardiovascular system: S1 & S2 heard, RRR.  Gastrointestinal system: Abdomen is nondistended, soft and has generalized mild tenderness.  Bowel sounds present Central nervous system: Alert and oriented Extremities: No edema, no clubbing ,no cyanosis, ulcer on the right heel Skin: No rashes, no ulcers,no icterus     Data Reviewed: I have personally reviewed following labs and imaging studies  CBC: Recent Labs  Lab 06/16/24 1208 06/17/24 0504  WBC 11.7* 8.8  NEUTROABS 8.6*  --   HGB 9.2* 7.5*  HCT 30.6* 24.5*  MCV 100.7* 101.7*  PLT 505* 380   Basic Metabolic Panel: Recent Labs  Lab 06/16/24 1208 06/17/24 0504  NA 137 138  K 4.7 4.3  CL 103 106  CO2 19* 21*  GLUCOSE 150* 109*  BUN 43* 40*  CREATININE 2.85* 2.60*  CALCIUM  8.3* 7.6*     Recent Results (from the past 240 hours)  MRSA Next Gen by PCR, Nasal     Status: None   Collection Time: 06/16/24  8:36 PM   Specimen: Urine, Clean Catch; Nasal Swab  Result Value Ref Range Status   MRSA by PCR Next Gen NOT DETECTED NOT DETECTED Final    Comment: (NOTE) The GeneXpert MRSA  Assay (FDA approved for NASAL specimens only), is one component of a comprehensive MRSA colonization surveillance program. It is not intended to diagnose MRSA infection nor to guide or monitor treatment for MRSA infections. Test performance is not FDA approved in patients less than 20 years old. Performed at [facility], [address] [place], [place] [address]      Radiology Studies: DG Ankle 2 Views Right Result Date: 06/16/2024 CLINICAL DATA:  Repeat lateral image of the right ankle. EXAM: RIGHT ANKLE - 2 VIEW COMPARISON:  Same day radiographs of the right ankle dated 06/16/2024 at 12:13 p.m. FINDINGS: Soft tissue defect/wound overlying the posterior calcaneus. No radiographic evidence of acute osteolysis or erosive changes. Plantar calcaneal spur. IMPRESSION: Soft tissue defect/wound overlying the posterior calcaneus without radiographic evidence to suggest acute osteomyelitis at this time. Electronically Signed   By: Harrietta Sherry M.D.   On: 06/16/2024 14:35   CT ABDOMEN PELVIS WO CONTRAST  Result Date: 06/16/2024 CLINICAL DATA:  Abdominal pain, nausea vomiting and diarrhea. EXAM: CT ABDOMEN AND PELVIS WITHOUT CONTRAST TECHNIQUE: Multidetector CT imaging of the abdomen and pelvis was performed following the standard protocol without IV contrast. RADIATION DOSE REDUCTION: This exam was performed according to the departmental dose-optimization program which includes automated exposure control, adjustment of the mA and/or kV according to patient size and/or use of iterative reconstruction technique. COMPARISON:  CT abdomen pelvis dated 02/13/2023. FINDINGS: Evaluation of this exam is limited in the absence of intravenous contrast. Lower chest: Partially visualized small left pleural effusion. There is coronary vascular calcification. No intra-abdominal free air or free fluid. Hepatobiliary: Small cyst in the left lobe of the liver. No biliary dilatation. The gallbladder is  unremarkable. Pancreas: Unremarkable. No pancreatic ductal dilatation or surrounding inflammatory changes. Spleen: Normal in size without focal abnormality. Adrenals/Urinary Tract: The adrenal glands unremarkable. No significant interval change in the left renal interpolar cyst. Tiny nonobstructing left renal calculi measure up to 2 mm. No hydronephrosis. There is no hydronephrosis or nephrolithiasis on the right. The visualized ureters appear unremarkable. There is slight thickened and irregular appearance of the left lateral bladder wall which may be related to underdistention. A urothelial neoplasm is not excluded. Further evaluation with cystoscopy is recommended. Stomach/Bowel: Significant interval increase in the size of the cecal mass seen on the prior CT filling of the cecum and measuring approximately 4.3 x 8.0 cm (coronal 60/8). This is consistent with a colonic malignancy. Further evaluation with colonoscopy is recommended. There is sigmoid diverticulosis without active inflammatory changes. There is no bowel obstruction. The appendix is not visualized with certainty. No inflammatory changes identified in the right lower quadrant. For Vascular/Lymphatic: Moderate aortoiliac atherosclerotic disease. The IVC is unremarkable. No portal venous gas. There is no adenopathy. A subcentimeter lymph node posterior to the cecum and anterior to the right psoas muscle measures 6 mm in short axis slightly increased since the prior CT. Reproductive: The prostate and seminal vesicles are grossly unremarkable. Penile implant with reservoir in the anterior pelvis. Other: None Musculoskeletal: Osteopenia with degenerative changes of the spine. Total right hip arthroplasty. No acute osseous pathology. IMPRESSION: 1. 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 Further evaluation with colonoscopy is recommended. 2. Sigmoid diverticulosis. No bowel obstruction. 3. Tiny  nonobstructing left renal calculi. No hydronephrosis. 4. Slight thickened and irregular appearance of the left lateral bladder wall. A urothelial neoplasm is not excluded. Further evaluation with cystoscopy is recommended. 5.  Aortic Atherosclerosis (ICD10-I70.0). Electronically Signed   By: Vanetta Chou M.D.   On: 06/16/2024 14:00   DG Ankle Complete Right Result Date: 06/16/2024 EXAM: 3 or more VIEW(S) XRAY OF THE RIGHT ANKLE 06/16/2024 12:17:00 PM CLINICAL HISTORY: Weakness, wound infection - He was released from [facility], post rehab for broken hip recently. Since discharge, he has experienced weakness. He also had nausea, vomiting and diarrhea a few days ago. Pt has a wound on his right anterior ankle from; his leg being in traction from a recent right hip fracture. COMPARISON: None available. FINDINGS: BONES AND JOINTS: Calcaneal spur. No acute fracture. No joint dislocation. SOFT TISSUES: Mild diffuse soft tissue swelling. Soft tissue defect is noted posterior to the calcaneus with underlying focal area of osteopenia involving the posterior calcaneus. Note: this area is obscured by external artifact. If there is a focal wound in this area, I would suggest repeating the lateral projection radiograph of the ankle after removal  of the external artifact to confirm presence of focal bone erosion which may reflect osteomyelitis of the calcaneus. IMPRESSION: 1. Soft tissue defect posterior to the calcaneus with underlying focal osteopenia, obscured by external artifact. If a focal wound is present in this area, I would recommend repeat lateral radiograph after artifact removal to confirm possible bone erosion, which may reflect osteomyelitis. 2. Mild diffuse soft tissue swelling. Electronically signed by: Waddell Calk MD 06/16/2024 12:50 PM EDT RP Workstation: [id number]   DG Chest Portable 1 View Result Date: 06/16/2024 EXAM: 1 VIEW XRAY OF THE CHEST 06/16/2024 12:17:00 PM COMPARISON: 03/23/2024  CLINICAL HISTORY: Weakness, wound infection. Patient was released from [facility], post rehab for broken hip recently. Since discharge, he has experienced weakness. He also had nausea, vomiting and diarrhea a few days ago. Patient has a wound on his right anterior ankle from his leg being in traction from a recent right hip fracture. FINDINGS: LUNGS AND PLEURA: Low lung volumes with elevated left hemidiaphragm. Mild left lung base atelectasis. No focal pulmonary opacity. No pulmonary edema. No pleural effusion. No pneumothorax. HEART AND MEDIASTINUM: Aortic atherosclerosis. No acute abnormality of the cardiac and mediastinal silhouettes. BONES AND SOFT TISSUES: Remote healed left lateral rib fracture deformity. No acute osseous abnormality. IMPRESSION: 1. Low lung volumes with elevated left hemidiaphragm and mild left lung base atelectasis. Electronically signed by: Waddell Calk MD 06/16/2024 12:46 PM EDT RP Workstation: [id number]    Scheduled Meds:  carbidopa -levodopa   1 tablet Oral BID   cholecalciferol   1,000 Units Oral Daily   enoxaparin  (LOVENOX ) injection  30 mg Subcutaneous Q24H   escitalopram   10 mg Oral Daily   folic acid   1 mg Oral Daily   Gerhardt's butt cream   Topical TID   insulin  aspart  0-5 Units Subcutaneous QHS   insulin  aspart  0-9 Units Subcutaneous TID WC   insulin  aspart protamine- aspart  7 Units Subcutaneous BID WC   leptospermum manuka honey  1 Application Topical Daily   multivitamin with minerals  1 tablet Oral Daily   pramipexole   0.25 mg Oral QHS   rosuvastatin   10 mg Oral QHS   sodium bicarbonate   650 mg Oral BID   tamsulosin   0.4 mg Oral Daily   Continuous Infusions:  cefTRIAXone  (ROCEPHIN )  IV     lactated ringers  125 mL/hr at 06/17/24 0430     LOS: 1 day   Ivonne Mustache, MD [facility] P8/28/2025, 8:21 AM

## 2024-06-17 NOTE — Evaluation (Signed)
 Physical Therapy Evaluation Patient Details Name: Jake Bartlett MRN: [id number] DOB: 1941/07/11 Today's Date: 06/17/2024  History of Present Illness  83 year old male was sent from PCP office due to concern for infection and weakness and admitted for Severe sepsis secondary to UTI. PMHx: R ankle wound (from traction awaiting R hip surgery in June), cecal mass, diverticulosis, UTI, Past medical history of CKD metabolic acidosis baseline, chronic anemia hemoglobin baseline around 8-10 gm, type 2 diabetes, hypertension, and history of right thalamic ICH with left-sided weakness, Parkinson disease, recent right hip total replacement revision and staple removal on 04/15/2024  Clinical Impression  Pt admitted with above diagnosis.  Pt currently with functional limitations due to the deficits listed below (see PT Problem List). Pt will benefit from acute skilled PT to increase their independence and safety with mobility to allow discharge.  Pt initially only agreeable to sit EOB however once sitting for a few minutes, agreeable to attempt transfer to recliner with at least +2 assist.  Pt reports weakness and fatigue limiting his mobility currently.  Pt home for 2 days from SNF prior to this admission.  Pt's spouse present and reports she hired a caregiver to assist pt however only 4 hrs/day.  Pt only OOB to w/c with caregiver as spouse is unable to physically assist.  Pt and spouse uncertain of d/c plan at this time; they wish to see how pt progresses.  Would recommend higher level of care if more assist/care at home cannot be coordinated.  Pt also appears more fearful and anxious of mobilizing due to previous fall.         If plan is discharge home, recommend the following: A little help with walking and/or transfers;A little help with bathing/dressing/bathroom;Assistance with cooking/housework;Help with stairs or ramp for entrance;Assist for transportation   Can travel by private vehicle         Equipment Recommendations None recommended by PT  Recommendations for Other Services       Functional Status Assessment       Precautions / Restrictions Precautions Precautions: Fall Recall of Precautions/Restrictions: Intact      Mobility  Bed Mobility Overal bed mobility: Needs Assistance Bed Mobility: Supine to Sit     Supine to sit: Min assist, HOB elevated     General bed mobility comments: increased time and effort, assist for trunk upright    Transfers Overall transfer level: Needs assistance Equipment used: Rolling walker (2 wheels) Transfers: Sit to/from Stand, Bed to chair/wheelchair/BSC Sit to Stand: Min assist, +2 safety/equipment, From elevated surface   Step pivot transfers: Min assist, +2 safety/equipment       General transfer comment: verbal cues for positioning and technique; pt agreeable to OOB to recliner after sitting EOB for a few minutes; pt reports some fear/anxiety with mobility once sitting in recliner    Ambulation/Gait                  Stairs            Wheelchair Mobility     Tilt Bed    Modified Rankin (Stroke Patients Only)       Balance Overall balance assessment: Needs assistance Sitting-balance support: No upper extremity supported, Feet supported Sitting balance-Leahy Scale: Fair     Standing balance support: Bilateral upper extremity supported, During functional activity, Reliant on assistive device for balance Standing balance-Leahy Scale: Poor  Pertinent Vitals/Pain Pain Assessment Pain Assessment: No/denies pain    Home Living Family/patient expects to be discharged to:: Private residence Living Arrangements: Spouse/significant other Available Help at Discharge: Family Type of Home: House Home Access: Level entry       Home Layout: One level Home Equipment: Agricultural consultant (2 wheels);BSC/3in1;Shower seat;Wheelchair - manual;Cane - single  point Additional Comments: Pt lives with wife, son lives down the street    Prior Function Prior Level of Function : Needs assist             Mobility Comments: Pt home for a couple days after [employer], feeling weak, mainly in bed, using w/c when OOB; spouse hired caregiver to assist 4 hrs a day; pt only has been OOB with assist from caregiver (she is unable to provide physical assist) ADLs Comments: help with all ADLs     Extremity/Trunk Assessment        Lower Extremity Assessment Lower Extremity Assessment: Generalized weakness;RLE deficits/detail RLE Deficits / Details: wound R ankle/heel (encouraged Prevalon boots present in room and floating heels)    Cervical / Trunk Assessment Cervical / Trunk Assessment: Kyphotic;Other exceptions Cervical / Trunk Exceptions: forward head posture  Communication   Communication Communication: Impaired Factors Affecting Communication: Hearing impaired    Cognition Arousal: Alert Behavior During Therapy: WFL for tasks assessed/performed                           PT - Cognition Comments: reports some anxiety and fear in regards to mobility Following commands: Intact       Cueing Cueing Techniques: Verbal cues     General Comments      Exercises     Assessment/Plan    PT Assessment Patient needs continued PT services  PT Problem List Decreased strength;Decreased activity tolerance;Decreased balance;Decreased mobility;Decreased knowledge of use of DME       PT Treatment Interventions DME instruction;Gait training;Balance training;Functional mobility training;Therapeutic activities;Therapeutic exercise;Patient/family education;Wheelchair mobility training    PT Goals (Current goals can be found in the Care Plan section)  Acute Rehab PT Goals PT Goal Formulation: With patient/family Time For Goal Achievement: 07/01/24 Potential to Achieve Goals: Good    Frequency Min 3X/week     Co-evaluation                AM-PAC PT 6 Clicks Mobility  Outcome Measure Help needed turning from your back to your side while in a flat bed without using bedrails?: A Little Help needed moving from lying on your back to sitting on the side of a flat bed without using bedrails?: A Little Help needed moving to and from a bed to a chair (including a wheelchair)?: A Little Help needed standing up from a chair using your arms (e.g., wheelchair or bedside chair)?: A Lot Help needed to walk in hospital room?: A Lot Help needed climbing 3-5 steps with a railing? : A Lot 6 Click Score: 15    End of Session Equipment Utilized During Treatment: Gait belt Activity Tolerance: Patient tolerated treatment well Patient left: in chair;with call bell/phone within reach;with chair alarm set;with family/visitor present Nurse Communication: Mobility status PT Visit Diagnosis: Difficulty in walking, not elsewhere classified (R26.2);Muscle weakness (generalized) (M62.81)    Time: 8576-8543 PT Time Calculation (min) (ACUTE ONLY): 33 min   Charges:   PT Evaluation $PT Eval Low Complexity: 1 Low PT Treatments $Therapeutic Activity: 8-22 mins PT General Charges $$ ACUTE PT VISIT: 1  Visit        Tari KLEIN, DPT Physical Therapist Acute Rehabilitation Services Office: [phone]   Tari LITTIE Farm 06/17/2024, 4:27 PM

## 2024-06-18 DIAGNOSIS — R6520 Severe sepsis without septic shock: Secondary | ICD-10-CM | POA: Diagnosis not present

## 2024-06-18 DIAGNOSIS — A419 Sepsis, unspecified organism: Secondary | ICD-10-CM | POA: Diagnosis not present

## 2024-06-18 LAB — IRON AND TIBC
Iron: 18 ug/dL — ABNORMAL LOW (ref 45–182)
Saturation Ratios: 13 % — ABNORMAL LOW (ref 17.9–39.5)
TIBC: 135 ug/dL — ABNORMAL LOW (ref 250–450)
UIBC: 117 ug/dL

## 2024-06-18 LAB — GLUCOSE, CAPILLARY
Glucose-Capillary: 118 mg/dL — ABNORMAL HIGH (ref 70–99)
Glucose-Capillary: 105 mg/dL — ABNORMAL HIGH (ref 70–99)
Glucose-Capillary: 86 mg/dL (ref 70–99)
Glucose-Capillary: 82 mg/dL (ref 70–99)

## 2024-06-18 LAB — BASIC METABOLIC PANEL WITH GFR
Anion gap: 12 (ref 5–15)
BUN: 38 mg/dL — ABNORMAL HIGH (ref 8–23)
CO2: 20 mmol/L — ABNORMAL LOW (ref 22–32)
Calcium: 7.6 mg/dL — ABNORMAL LOW (ref 8.9–10.3)
Chloride: 104 mmol/L (ref 98–111)
Creatinine, Ser: 2.48 mg/dL — ABNORMAL HIGH (ref 0.61–1.24)
GFR, Estimated: 25 mL/min — ABNORMAL LOW (ref >60)
Glucose, Bld: 111 mg/dL — ABNORMAL HIGH (ref 70–99)
Potassium: 4.5 mmol/L (ref 3.5–5.1)
Sodium: 136 mmol/L (ref 135–145)

## 2024-06-18 LAB — CBC
HCT: 24.7 % — ABNORMAL LOW (ref 39.0–52.0)
Hemoglobin: 7.6 g/dL — ABNORMAL LOW (ref 13.0–17.0)
MCH: 31 pg (ref 26.0–34.0)
MCHC: 30.8 g/dL (ref 30.0–36.0)
MCV: 100.8 fL — ABNORMAL HIGH (ref 80.0–100.0)
Platelets: 395 K/uL (ref 150–400)
RBC: 2.45 MIL/uL — ABNORMAL LOW (ref 4.22–5.81)
RDW: 16.8 % — ABNORMAL HIGH (ref 11.5–15.5)
WBC: 12.5 K/uL — ABNORMAL HIGH (ref 4.0–10.5)
nRBC: 0 % (ref 0.0–0.2)

## 2024-06-18 LAB — HEMOGLOBIN A1C
Hgb A1c MFr Bld: 5.8 % — ABNORMAL HIGH (ref 4.8–5.6)
Mean Plasma Glucose: 120 mg/dL

## 2024-06-18 LAB — CG4 I-STAT (LACTIC ACID): Lactic Acid, Venous: 1.4 mmol/L (ref 0.5–1.9)

## 2024-06-18 MED ORDER — NA SULFATE-K SULFATE-MG SULF 17.5-3.13-1.6 GM/177ML PO SOLN
0.5000 | Freq: Once | ORAL | Status: AC
  Administered 2024-06-18: 177 mL via ORAL
  Filled 2024-06-18: qty 1

## 2024-06-18 MED ORDER — HEPARIN SODIUM (PORCINE) 5000 UNIT/ML IJ SOLN
5000.0000 [IU] | Freq: Three times a day (TID) | INTRAMUSCULAR | Status: DC
  Administered 2024-06-19 – 2024-06-22 (×9): 5000 [IU] via SUBCUTANEOUS
  Filled 2024-06-18 (×9): qty 1

## 2024-06-18 MED ORDER — SODIUM CHLORIDE 0.9 % IV SOLN: INTRAVENOUS | Status: AC

## 2024-06-18 MED ORDER — IRON SUCROSE 200 MG IVPB - SIMPLE MED
200.0000 mg | Freq: Once | Status: AC
  Administered 2024-06-18: 200 mg via INTRAVENOUS
  Filled 2024-06-18: qty 200

## 2024-06-18 MED ORDER — SODIUM BICARBONATE 650 MG PO TABS
650.0000 mg | ORAL_TABLET | Freq: Three times a day (TID) | ORAL | Status: DC
  Administered 2024-06-18 – 2024-06-25 (×19): 650 mg via ORAL
  Filled 2024-06-18 (×19): qty 1

## 2024-06-18 MED ORDER — NA SULFATE-K SULFATE-MG SULF 17.5-3.13-1.6 GM/177ML PO SOLN
0.5000 | Freq: Once | ORAL | Status: AC
  Administered 2024-06-18: 177 mL via ORAL

## 2024-06-18 MED ORDER — BISACODYL 5 MG PO TBEC
10.0000 mg | DELAYED_RELEASE_TABLET | Freq: Once | ORAL | Status: AC
  Administered 2024-06-18: 10 mg via ORAL
  Filled 2024-06-18: qty 2

## 2024-06-18 NOTE — Progress Notes (Signed)
 Subjective: Patient had 2 large bowel movements yesterday.  Objective: Vital signs in last 24 hours: Temp:  [98 F (36.7 C)-98.6 F (37 C)] 98.6 F (37 C) (08/29 0620) Pulse Rate:  [69-82] 82 (08/29 0620) Resp:  [16-19] 19 (08/29 0620) BP: (94-120)/(46-63) 120/63 (08/29 0620) SpO2:  [96 %-98 %] 98 % (08/29 0620) Weight change:  Last BM Date : 06/17/24  PE: Sitting up on bed GENERAL: Not in distress, prominent pallor  ABDOMEN: Nondistended, nontender EXTREMITIES: No deformity  Lab Results: Results for orders placed or performed during the hospital encounter of 06/16/24 (from the past 48 hours)  CBC with Differential     Status: Abnormal   Collection Time: 06/16/24 12:08 PM  Result Value Ref Range   WBC 11.7 (H) 4.0 - 10.5 K/uL   RBC 3.04 (L) 4.22 - 5.81 MIL/uL   Hemoglobin 9.2 (L) 13.0 - 17.0 g/dL   HCT 69.3 (L) 60.9 - 47.9 %   MCV 100.7 (H) 80.0 - 100.0 fL   MCH 30.3 26.0 - 34.0 pg   MCHC 30.1 30.0 - 36.0 g/dL   RDW 82.9 (H) 88.4 - 84.4 %   Platelets 505 (H) 150 - 400 K/uL   nRBC 0.0 0.0 - 0.2 %   Neutrophils Relative % 73 %   Neutro Abs 8.6 (H) 1.7 - 7.7 K/uL   Lymphocytes Relative 16 %   Lymphs Abs 1.9 0.7 - 4.0 K/uL   Monocytes Relative 7 %   Monocytes Absolute 0.8 0.1 - 1.0 K/uL   Eosinophils Relative 2 %   Eosinophils Absolute 0.3 0.0 - 0.5 K/uL   Basophils Relative 1 %   Basophils Absolute 0.1 0.0 - 0.1 K/uL   Immature Granulocytes 1 %   Abs Immature Granulocytes 0.12 (H) 0.00 - 0.07 K/uL    Comment: Performed at [facility], [address] [place], [place] [address]  Comprehensive metabolic panel     Status: Abnormal   Collection Time: 06/16/24 12:08 PM  Result Value Ref Range   Sodium 137 135 - 145 mmol/L   Potassium 4.7 3.5 - 5.1 mmol/L   Chloride 103 98 - 111 mmol/L   CO2 19 (L) 22 - 32 mmol/L   Glucose, Bld 150 (H) 70 - 99 mg/dL    Comment: Glucose reference range applies only to samples taken after fasting for at least 8 hours.    BUN 43 (H) 8 - 23 mg/dL   Creatinine, Ser 7.14 (H) 0.61 - 1.24 mg/dL   Calcium  8.3 (L) 8.9 - 10.3 mg/dL   Total Protein 6.4 (L) 6.5 - 8.1 g/dL   Albumin 2.7 (L) 3.5 - 5.0 g/dL   AST 25 15 - 41 U/L   ALT <5 0 - 44 U/L   Alkaline Phosphatase 158 (H) 38 - 126 U/L   Total Bilirubin 0.4 0.0 - 1.2 mg/dL   GFR, Estimated 21 (L) >60 mL/min    Comment: (NOTE) Calculated using the CKD-EPI Creatinine Equation (2021)    Anion gap 15 5 - 15    Comment: Performed at [facility], [address], [place], [place] [address]  Lipase, blood     Status: None   Collection Time: 06/16/24 12:08 PM  Result Value Ref Range   Lipase 37 11 - 51 U/L    Comment: Performed at [facility], [address], [place], [place] [address]  Blood culture (routine x 2)     Status: None (Preliminary result)  Collection Time: 06/16/24 12:08 PM   Specimen: BLOOD  Result Value Ref Range   Specimen Description      BLOOD SITE NOT SPECIFIED Performed at [name], [address] [place], [place] [address]    Special Requests      BOTTLES DRAWN AEROBIC AND ANAEROBIC Blood Culture adequate volume Performed at [facility], [address], [place], [place] [address]    Culture      NO GROWTH 2 DAYS Performed at [facility], [address], [place], [place] [address]    Report Status PENDING   I-Stat CG4 Lactic Acid     Status: Abnormal   Collection Time: 06/16/24 12:15 PM  Result Value Ref Range   Lactic Acid, Venous 2.7 (HH) 0.5 - 1.9 mmol/L   Comment NOTIFIED PHYSICIAN   Urinalysis, Routine w reflex microscopic -Urine, Clean Catch     Status: Abnormal   Collection Time: 06/16/24  2:05 PM  Result Value Ref Range   Color, Urine YELLOW YELLOW   APPearance CLOUDY (A) CLEAR   Specific Gravity, Urine 1.011 1.005 - 1.030   pH 5.0 5.0 - 8.0   Glucose, UA NEGATIVE NEGATIVE mg/dL   Hgb urine dipstick MODERATE (A) NEGATIVE    Bilirubin Urine NEGATIVE NEGATIVE   Ketones, ur NEGATIVE NEGATIVE mg/dL   Protein, ur 899 (A) NEGATIVE mg/dL   Nitrite POSITIVE (A) NEGATIVE   Leukocytes,Ua LARGE (A) NEGATIVE   RBC / HPF 11-20 0 - 5 RBC/hpf   WBC, UA >50 0 - 5 WBC/hpf   Bacteria, UA MANY (A) NONE SEEN   Squamous Epithelial / HPF 0-5 0 - 5 /HPF   WBC Clumps PRESENT     Comment: Performed at [facility], [address], [place], [place] [address]  CG4 I-STAT (Lactic acid)     Status: None   Collection Time: 06/16/24  3:11 PM  Result Value Ref Range   Lactic Acid, Venous 1.4 0.5 - 1.9 mmol/L  Blood culture (routine x 2)     Status: None (Preliminary result)   Collection Time: 06/16/24  3:17 PM   Specimen: BLOOD  Result Value Ref Range   Specimen Description      BLOOD SITE NOT SPECIFIED Performed at [facility], [address], [place], [place] [address]    Special Requests      BOTTLES DRAWN AEROBIC AND ANAEROBIC Blood Culture adequate volume Performed at [facility], [address] [place], [place] [address]    Culture      NO GROWTH 2 DAYS Performed at [facility], [address], [place], [place] [address]    Report Status PENDING   Hemoglobin A1c     Status: Abnormal   Collection Time: 06/16/24  7:26 PM  Result Value Ref Range   Hgb A1c MFr Bld 5.8 (H) 4.8 - 5.6 %    Comment: (NOTE)         Prediabetes: 5.7 - 6.4         Diabetes: >6.4         Glycemic control for adults with diabetes: <7.0    Mean Plasma Glucose 120 mg/dL    Comment: (NOTE) Performed At: [facility] [address] [place], [place] [id number] Jennette Shorter MD Ey:[id number]   Urine Culture     Status: Abnormal   Collection Time: 06/16/24  8:36 PM   Specimen: Urine, Clean Catch  Result Value Ref Range  Specimen Description      URINE, CLEAN CATCH Performed at [facility], [address] [place], [place] [address]    Special  Requests      NONE Performed at [facility], [address] [place], [place] [address]    Culture MULTIPLE SPECIES PRESENT, SUGGEST RECOLLECTION (A)    Report Status 06/17/2024 FINAL   MRSA Next Gen by PCR, Nasal     Status: None   Collection Time: 06/16/24  8:36 PM   Specimen: Urine, Clean Catch; Nasal Swab  Result Value Ref Range   MRSA by PCR Next Gen NOT DETECTED NOT DETECTED    Comment: (NOTE) The GeneXpert MRSA Assay (FDA approved for NASAL specimens only), is one component of a comprehensive MRSA colonization surveillance program. It is not intended to diagnose MRSA infection nor to guide or monitor treatment for MRSA infections. Test performance is not FDA approved in patients less than 72 years old. Performed at [facility], [address] [place], [place] [address]   Glucose, capillary     Status: Abnormal   Collection Time: 06/16/24 10:45 PM  Result Value Ref Range   Glucose-Capillary 115 (H) 70 - 99 mg/dL    Comment: Glucose reference range applies only to samples taken after fasting for at least 8 hours.  Basic metabolic panel     Status: Abnormal   Collection Time: 06/17/24  5:04 AM  Result Value Ref Range   Sodium 138 135 - 145 mmol/L   Potassium 4.3 3.5 - 5.1 mmol/L   Chloride 106 98 - 111 mmol/L   CO2 21 (L) 22 - 32 mmol/L   Glucose, Bld 109 (H) 70 - 99 mg/dL    Comment: Glucose reference range applies only to samples taken after fasting for at least 8 hours.   BUN 40 (H) 8 - 23 mg/dL   Creatinine, Ser 7.39 (H) 0.61 - 1.24 mg/dL   Calcium  7.6 (L) 8.9 - 10.3 mg/dL   GFR, Estimated 24 (L) >60 mL/min    Comment: (NOTE) Calculated using the CKD-EPI Creatinine Equation (2021)    Anion gap 12 5 - 15    Comment: Performed at [facility], [address] [place], [place] [address]  CBC     Status: Abnormal   Collection Time: 06/17/24  5:04 AM  Result Value Ref Range   WBC 8.8 4.0 - 10.5 K/uL   RBC  2.41 (L) 4.22 - 5.81 MIL/uL   Hemoglobin 7.5 (L) 13.0 - 17.0 g/dL   HCT 75.4 (L) 60.9 - 47.9 %   MCV 101.7 (H) 80.0 - 100.0 fL   MCH 31.1 26.0 - 34.0 pg   MCHC 30.6 30.0 - 36.0 g/dL   RDW 83.1 (H) 88.4 - 84.4 %   Platelets 380 150 - 400 K/uL   nRBC 0.0 0.0 - 0.2 %    Comment: Performed at [facility], [address], [place], [place] [address]  Glucose, capillary     Status: Abnormal   Collection Time: 06/17/24  7:20 AM  Result Value Ref Range   Glucose-Capillary 137 (H) 70 - 99 mg/dL    Comment: Glucose reference range applies only to samples taken after fasting for at least 8 hours.  Glucose, capillary     Status: Abnormal   Collection Time: 06/17/24 11:30 AM  Result Value Ref Range   Glucose-Capillary 103 (H) 70 - 99 mg/dL    Comment: Glucose reference range applies  only to samples taken after fasting for at least 8 hours.  Glucose, capillary     Status: Abnormal   Collection Time: 06/17/24  4:29 PM  Result Value Ref Range   Glucose-Capillary 169 (H) 70 - 99 mg/dL    Comment: Glucose reference range applies only to samples taken after fasting for at least 8 hours.  Glucose, capillary     Status: Abnormal   Collection Time: 06/17/24  9:43 PM  Result Value Ref Range   Glucose-Capillary 107 (H) 70 - 99 mg/dL    Comment: Glucose reference range applies only to samples taken after fasting for at least 8 hours.  CBC     Status: Abnormal   Collection Time: 06/18/24  5:13 AM  Result Value Ref Range   WBC 12.5 (H) 4.0 - 10.5 K/uL   RBC 2.45 (L) 4.22 - 5.81 MIL/uL   Hemoglobin 7.6 (L) 13.0 - 17.0 g/dL   HCT 75.2 (L) 60.9 - 47.9 %   MCV 100.8 (H) 80.0 - 100.0 fL   MCH 31.0 26.0 - 34.0 pg   MCHC 30.8 30.0 - 36.0 g/dL   RDW 83.1 (H) 88.4 - 84.4 %   Platelets 395 150 - 400 K/uL   nRBC 0.0 0.0 - 0.2 %    Comment: Performed at [facility], [address] [place], [place] [address]  Basic metabolic panel with GFR     Status: Abnormal    Collection Time: 06/18/24  5:13 AM  Result Value Ref Range   Sodium 136 135 - 145 mmol/L   Potassium 4.5 3.5 - 5.1 mmol/L    Comment: HEMOLYSIS AT THIS LEVEL MAY AFFECT RESULT   Chloride 104 98 - 111 mmol/L   CO2 20 (L) 22 - 32 mmol/L   Glucose, Bld 111 (H) 70 - 99 mg/dL    Comment: Glucose reference range applies only to samples taken after fasting for at least 8 hours.   BUN 38 (H) 8 - 23 mg/dL   Creatinine, Ser 7.51 (H) 0.61 - 1.24 mg/dL   Calcium  7.6 (L) 8.9 - 10.3 mg/dL   GFR, Estimated 25 (L) >60 mL/min    Comment: (NOTE) Calculated using the CKD-EPI Creatinine Equation (2021)    Anion gap 12 5 - 15    Comment: Performed at [facility], [address], [place], [place] [address]  Glucose, capillary     Status: Abnormal   Collection Time: 06/18/24  7:42 AM  Result Value Ref Range   Glucose-Capillary 118 (H) 70 - 99 mg/dL    Comment: Glucose reference range applies only to samples taken after fasting for at least 8 hours.    Studies/Results: DG Ankle 2 Views Right Result Date: 06/16/2024 CLINICAL DATA:  Repeat lateral image of the right ankle. EXAM: RIGHT ANKLE - 2 VIEW COMPARISON:  Same day radiographs of the right ankle dated 06/16/2024 at 12:13 p.m. FINDINGS: Soft tissue defect/wound overlying the posterior calcaneus. No radiographic evidence of acute osteolysis or erosive changes. Plantar calcaneal spur. IMPRESSION: Soft tissue defect/wound overlying the posterior calcaneus without radiographic evidence to suggest acute osteomyelitis at this time. Electronically Signed   By: Harrietta Sherry M.D.   On: 06/16/2024 14:35   CT ABDOMEN PELVIS WO CONTRAST Result Date: 06/16/2024 CLINICAL DATA:  Abdominal pain, nausea vomiting and diarrhea. EXAM: CT ABDOMEN AND PELVIS WITHOUT CONTRAST TECHNIQUE: Multidetector CT imaging of the abdomen and pelvis was performed following the standard protocol without IV contrast. RADIATION DOSE REDUCTION: This  exam was performed  according to the departmental dose-optimization program which includes automated exposure control, adjustment of the mA and/or kV according to patient size and/or use of iterative reconstruction technique. COMPARISON:  CT abdomen pelvis dated 02/13/2023. FINDINGS: Evaluation of this exam is limited in the absence of intravenous contrast. Lower chest: Partially visualized small left pleural effusion. There is coronary vascular calcification. No intra-abdominal free air or free fluid. Hepatobiliary: Small cyst in the left lobe of the liver. No biliary dilatation. The gallbladder is unremarkable. Pancreas: Unremarkable. No pancreatic ductal dilatation or surrounding inflammatory changes. Spleen: Normal in size without focal abnormality. Adrenals/Urinary Tract: The adrenal glands unremarkable. No significant interval change in the left renal interpolar cyst. Tiny nonobstructing left renal calculi measure up to 2 mm. No hydronephrosis. There is no hydronephrosis or nephrolithiasis on the right. The visualized ureters appear unremarkable. There is slight thickened and irregular appearance of the left lateral bladder wall which may be related to underdistention. A urothelial neoplasm is not excluded. Further evaluation with cystoscopy is recommended. Stomach/Bowel: Significant interval increase in the size of the cecal mass seen on the prior CT filling of the cecum and measuring approximately 4.3 x 8.0 cm (coronal 60/8). This is consistent with a colonic malignancy. Further evaluation with colonoscopy is recommended. There is sigmoid diverticulosis without active inflammatory changes. There is no bowel obstruction. The appendix is not visualized with certainty. No inflammatory changes identified in the right lower quadrant. For Vascular/Lymphatic: Moderate aortoiliac atherosclerotic disease. The IVC is unremarkable. No portal venous gas. There is no adenopathy. A subcentimeter lymph node posterior to the cecum and anterior  to the right psoas muscle measures 6 mm in short axis slightly increased since the prior CT. Reproductive: The prostate and seminal vesicles are grossly unremarkable. Penile implant with reservoir in the anterior pelvis. Other: None Musculoskeletal: Osteopenia with degenerative changes of the spine. Total right hip arthroplasty. No acute osseous pathology. IMPRESSION: 1. 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 Further evaluation with colonoscopy is recommended. 2. Sigmoid diverticulosis. No bowel obstruction. 3. Tiny nonobstructing left renal calculi. No hydronephrosis. 4. Slight thickened and irregular appearance of the left lateral bladder wall. A urothelial neoplasm is not excluded. Further evaluation with cystoscopy is recommended. 5.  Aortic Atherosclerosis (ICD10-I70.0). Electronically Signed   By: Vanetta Chou M.D.   On: 06/16/2024 14:00   DG Ankle Complete Right Result Date: 06/16/2024 EXAM: 3 or more VIEW(S) XRAY OF THE RIGHT ANKLE 06/16/2024 12:17:00 PM CLINICAL HISTORY: Weakness, wound infection - He was released from [facility], post rehab for broken hip recently. Since discharge, he has experienced weakness. He also had nausea, vomiting and diarrhea a few days ago. Pt has a wound on his right anterior ankle from; his leg being in traction from a recent right hip fracture. COMPARISON: None available. FINDINGS: BONES AND JOINTS: Calcaneal spur. No acute fracture. No joint dislocation. SOFT TISSUES: Mild diffuse soft tissue swelling. Soft tissue defect is noted posterior to the calcaneus with underlying focal area of osteopenia involving the posterior calcaneus. Note: this area is obscured by external artifact. If there is a focal wound in this area, I would suggest repeating the lateral projection radiograph of the ankle after removal of the external artifact to confirm presence of focal bone erosion which may reflect osteomyelitis of the  calcaneus. IMPRESSION: 1. Soft tissue defect posterior to the calcaneus with underlying focal osteopenia, obscured by external artifact. If a focal wound is  present in this area, I would recommend repeat lateral radiograph after artifact removal to confirm possible bone erosion, which may reflect osteomyelitis. 2. Mild diffuse soft tissue swelling. Electronically signed by: Waddell Calk MD 06/16/2024 12:50 PM EDT RP Workstation: [id number]   DG Chest Portable 1 View Result Date: 06/16/2024 EXAM: 1 VIEW XRAY OF THE CHEST 06/16/2024 12:17:00 PM COMPARISON: 03/23/2024 CLINICAL HISTORY: Weakness, wound infection. Patient was released from [facility], post rehab for broken hip recently. Since discharge, he has experienced weakness. He also had nausea, vomiting and diarrhea a few days ago. Patient has a wound on his right anterior ankle from his leg being in traction from a recent right hip fracture. FINDINGS: LUNGS AND PLEURA: Low lung volumes with elevated left hemidiaphragm. Mild left lung base atelectasis. No focal pulmonary opacity. No pulmonary edema. No pleural effusion. No pneumothorax. HEART AND MEDIASTINUM: Aortic atherosclerosis. No acute abnormality of the cardiac and mediastinal silhouettes. BONES AND SOFT TISSUES: Remote healed left lateral rib fracture deformity. No acute osseous abnormality. IMPRESSION: 1. Low lung volumes with elevated left hemidiaphragm and mild left lung base atelectasis. Electronically signed by: Waddell Calk MD 06/16/2024 12:46 PM EDT RP Workstation: [id number]    Medications: I have reviewed the patient's current medications.  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  Acute osteomyelitis of right ankle based on x-ray from 06/16/2024 Thickened and irregular left lateral bladder wall, urothelial neoplasm cannot be excluded, plan is outpatient urology follow-up for cystoscopy  Macrocytic anemia, hemoglobin 7.6, MCV 100.8 Mild  acidosis, bicarb 20 Renal impairment, BUN 38, creatinine 2.48, GFR 25 Low albumin 2.7, low total protein 6.4  Comorbidities: Diabetes, hypertension, history of right thalamic ICH with left-sided weakness, Parkinson's disease, recent right total hip replacement revision  Plan: Colonoscopy on Sunday if patient can take prep today and tomorrow. Will put orders for split prep for Suprep today and continue on clear liquid diet. Will reevaluate tomorrow and will probably need additional prep tomorrow.  Estelita Manas, MD 06/18/2024, 9:01 AM

## 2024-06-18 NOTE — NC FL2 (Signed)
 [place]  MEDICAID FL2 LEVEL OF CARE FORM     IDENTIFICATION  Patient Name: Jake Bartlett Birthdate: October 01, 1941 Sex: male Admission Date (Current Location): 06/16/2024  [facility] and [place] Number:  Producer, television/film/video and Address:         Provider Number: [id number]  Attending Physician Name and Address:  Jillian Buttery, MD  Relative Name and Phone Number:  Tandy Belanger([phone]    Current Level of Care: Hospital Recommended Level of Care: Skilled Nursing Facility Prior Approval Number:    Date Approved/Denied:   PASRR Number: From home. Recent stay @ [address];Active w/[facility] HHRN/PT/OT-PT recc [facility] SNF-will fax out await bed offers [name] spouse in agreement.  Discharge Plan: SNF    Current Diagnoses: Patient Active Problem List   Diagnosis Date Noted   Severe sepsis (HCC) 06/16/2024   Sepsis secondary to UTI (HCC) 06/16/2024   Open wound of right heel 06/16/2024   ICH (intracerebral hemorrhage) (HCC) 04/12/2022   Pressure injury of skin 04/12/2022    Orientation RESPIRATION BLADDER Height & Weight     Self, Time, Situation, Place  Normal Continent Weight: 78.9 kg Height:  5' 8 (172.7 cm)  BEHAVIORAL SYMPTOMS/MOOD NEUROLOGICAL BOWEL NUTRITION STATUS      Continent Diet (Clear currently to be advanced.)  AMBULATORY STATUS COMMUNICATION OF NEEDS Skin   Limited Assist Verbally PU Stage and Appropriate Care (R heel wound-see d/c summary) PU Stage 1 Dressing: Daily                     Personal Care Assistance Level of Assistance  Bathing, Dressing, Feeding Bathing Assistance: Limited assistance Feeding assistance: Limited assistance Dressing Assistance: Limited assistance     Functional Limitations Info  Sight, Hearing, Speech Sight Info: Impaired (eyeglasses) Hearing Info: Impaired (bilateral aids) Speech Info: Adequate    SPECIAL CARE FACTORS FREQUENCY  PT (By licensed PT), OT (By licensed OT)     PT Frequency: 5x week OT  Frequency: 5x week            Contractures Contractures Info: Not present    Additional Factors Info  Code Status, Allergies Code Status Info: full Allergies Info: Ropinirol;Gabapentin           Current Medications (06/18/2024):  This is the current hospital active medication list Current Facility-Administered Medications  Medication Dose Route Frequency Provider Last Rate Last Admin   0.9 %  sodium chloride  infusion   Intravenous Continuous Karki, Arya, MD       acetaminophen  (TYLENOL ) tablet 650 mg  650 mg Oral Q6H PRN Christobal Guadalajara, MD   650 mg at 06/17/24 9063   Or   acetaminophen  (TYLENOL ) suppository 650 mg  650 mg Rectal Q6H PRN Kc, Guadalajara, MD       carbidopa -levodopa  (SINEMET  IR) 25-250 MG per tablet immediate release 1 tablet  1 tablet Oral BID Kc, Ramesh, MD   1 tablet at 06/18/24 0852   cefTRIAXone  (ROCEPHIN ) 2 g in sodium chloride  0.9 % 100 mL IVPB  2 g Intravenous Q24H Kc, Ramesh, MD 200 mL/hr at 06/17/24 1611 2 g at 06/17/24 1611   cholecalciferol  (VITAMIN D3) 25 MCG (1000 UNIT) tablet 1,000 Units  1,000 Units Oral Daily Kc, Guadalajara, MD   1,000 Units at 06/18/24 0837   enoxaparin  (LOVENOX ) injection 30 mg  30 mg Subcutaneous Q24H Kc, Ramesh, MD   30 mg at 06/18/24 9162   escitalopram  (LEXAPRO ) tablet 10 mg  10 mg Oral Daily Christobal Guadalajara, MD  10 mg at 06/18/24 9163   folic acid  (FOLVITE ) tablet 1 mg  1 mg Oral Daily Kc, Mennie, MD   1 mg at 06/18/24 9162   Gerhardt's butt cream   Topical TID Christobal Mennie, MD   Given at 06/18/24 9160   insulin  aspart (novoLOG ) injection 0-5 Units  0-5 Units Subcutaneous QHS Kc, Ramesh, MD       insulin  aspart (novoLOG ) injection 0-9 Units  0-9 Units Subcutaneous TID WC Kc, Mennie, MD   2 Units at 06/17/24 1858   insulin  aspart protamine- aspart (NOVOLOG  MIX 70/30) injection 7 Units  7 Units Subcutaneous BID WC Kc, Mennie, MD   7 Units at 06/18/24 9146   iron  sucrose (VENOFER ) 200 mg in sodium chloride  0.9 % 100 mL IVPB  200 mg Intravenous Once  Adhikari, Amrit, MD       leptospermum manuka honey (MEDIHONEY) paste 1 Application  1 Application Topical Daily Christobal Mennie, MD   1 Application at 06/18/24 9161   multivitamin with minerals tablet 1 tablet  1 tablet Oral Daily Christobal Mennie, MD   1 tablet at 06/18/24 0836   Na Sulfate-K Sulfate-Mg Sulfate concentrate (SUPREP) kit 177 mL  0.5 kit Oral Once Karki, Arya, MD       ondansetron  (ZOFRAN ) tablet 4 mg  4 mg Oral Q6H PRN Kc, Ramesh, MD       Or   ondansetron  (ZOFRAN ) injection 4 mg  4 mg Intravenous Q6H PRN Kc, Ramesh, MD       polyethylene glycol (MIRALAX  / GLYCOLAX ) packet 17 g  17 g Oral Daily Adhikari, Amrit, MD       pramipexole  (MIRAPEX ) tablet 0.25 mg  0.25 mg Oral QHS Kc, Ramesh, MD   0.25 mg at 06/17/24 2225   rosuvastatin  (CRESTOR ) tablet 10 mg  10 mg Oral QHS Kc, Ramesh, MD   10 mg at 06/17/24 2225   sodium bicarbonate  tablet 650 mg  650 mg Oral TID Jillian Buttery, MD   650 mg at 06/18/24 9163   tamsulosin  (FLOMAX ) capsule 0.4 mg  0.4 mg Oral Daily Christobal Mennie, MD   0.4 mg at 06/18/24 9163     Discharge Medications: Please see discharge summary for a list of discharge medications.  Relevant Imaging Results:  Relevant Lab Results:   Additional Information ss#[id number]  Tzipporah Nagorski, [place], [place]

## 2024-06-18 NOTE — TOC Initial Note (Addendum)
 Transition of Care [facility]) - Initial/Assessment Note    Patient Details  Name: Jake Bartlett MRN: [id number] Date of Birth: May 15, 1941  Transition of Care [facility]) CM/SW Contact:    Bascom Service, RN Phone Number: 06/18/2024, 12:39 PM  Clinical Narrative: From home. Recent stay @ [address];Active w/[facility] HHRN/PT/OT-PT recc ST SNF-will fax out await bed offers [name] spouse in agreement. -2:39p        Service Provider Request Status Stars Address Phone  [facility] SNF  Accepted 1 [address], [place] [address] [phone]  [facility] Preferred SNF  Accepted 2 [address] [place] [address] [phone]  [facility] SN[facility]  Accepted 2 [address], [place] [address] [phone]  [facility] Preferred SNF  Accepted 4 [address] [place] [address] [phone]  [facility]  Accepted 1 [address], [place] [address] [phone]  [facility]  Accepted 1 [address] [place] [address] [phone]  [facility]  Accepted 1 [address] [place] [address] [phone]  [facility]  Accepted 4 [address] [place] [address] [phone]  [facility]  Accepted 2 [address] [place] [address] [phone]                  -2:39p spoke to [name](spouse) informed that patient used 76days @ [address] only has 24 days left to use @ ST SNF;CM recommended to file medicaid application for LTC just in cae needed after ST SNF rehab stay-[name] verbalized understanding provided info for Dept of Social Services website, & tel# on d/c instructions f/u section of d/c. Await choice for ST SNF.  Expected Discharge Plan: Skilled Nursing Facility Barriers to Discharge: Continued  Medical Work up   Patient Goals and CMS Choice   CMS Medicare.gov Compare Post Acute Care list provided to:: Patient Represenative (must comment) ([name](spouse))        Expected Discharge Plan and Services     Post Acute Care Choice: Skilled Nursing Facility Living arrangements for the past 2 months: Single Family Home                                      Prior Living Arrangements/Services Living arrangements for the past 2 months: Single Family Home Lives with:: Spouse                   Activities of Daily Living   ADL Screening (condition at time of admission) Independently performs ADLs?: No Does the patient have a NEW difficulty with bathing/dressing/toileting/self-feeding that is expected to last >3 days?: No Does the patient have a NEW difficulty with getting in/out of bed, walking, or climbing stairs that is expected to last >3 days?: No Does the patient have a NEW difficulty with communication that is expected to last >3 days?: No Is the patient deaf or have difficulty hearing?: Yes (hearing aids not at bedside) Does the patient have difficulty seeing, even when wearing glasses/contacts?: No Does the patient have difficulty concentrating, remembering, or making decisions?: No  Permission Sought/Granted                  Emotional Assessment              Admission  diagnosis:  Failure to thrive in adult [R62.7] Acute cystitis with hematuria [N30.01] 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] Severe sepsis (HCC) [A41.9, R65.20] Wound of right ankle, initial encounter [S91.001A] Hypotension, unspecified hypotension type [I95.9] Patient Active Problem List   Diagnosis Date Noted   Severe sepsis (HCC) 06/16/2024   Sepsis secondary to UTI (HCC) 06/16/2024   Open wound of right heel 06/16/2024   ICH (intracerebral hemorrhage) (HCC) 04/12/2022   Pressure injury of skin 04/12/2022   PCP:  Alyse Bradley, MD Pharmacy:   [facility] [phone] - [place], [place]  - [address] AT [facility] [address] [place] [address] Phone: [phone] Fax: [phone]  [place] [facility] [address] [place] [address] Phone: [phone] Fax: [phone]     Social Drivers of Health (SDOH) Social History: SDOH Screenings   Food Insecurity: No Food Insecurity (06/16/2024)  Housing: Low Risk  (06/16/2024)  Transportation Needs: No Transportation Needs (06/16/2024)  Utilities: Not At Risk (06/16/2024)  Financial Resource Strain: Low Risk  (12/05/2023)   Received from [facility]  Physical Activity: Insufficiently Active (06/24/2023)   Received from [facility]  Social Connections: Moderately Integrated (06/16/2024)  Stress: No Stress Concern Present (06/24/2023)   [facility]  Tobacco Use: Low Risk  (06/17/2024)   SDOH Interventions:     Readmission Risk Interventions     No data to display

## 2024-06-18 NOTE — Progress Notes (Signed)
 PROGRESS NOTE  Jake Bartlett  FMW:[id number] DOB: Jan 07, 1941 DOA: 06/16/2024 PCP: Alyse Bradley, MD   Brief Narrative: Patient is a 83 year old male with history of CKD stage IV with baseline creatinine around 2.8, chronic normocytic anemia with baseline hemoglobin around 8-10, type 2 diabetes, hypertension, right thalamic ICH with left-sided weakness, Parkinson disease, recent right total hip replacement revision who was sent to the emergency department for the evaluation of weakness, concern for infection.  On presentation, he was hypotensive.  Lab work showed creatinine of 2.8, lactate of 2.7, WBC count of 9.7.  UA was suspicious for UTI, started on ceftriaxone .  X-ray of the right ankle showed soft tissue defect/wound overlying the posterior calcaneus without evidence of osteomyelitis, wound care consulted.  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  GI consulted.  Plan for colonoscopy on Sunday  Assessment & Plan:  Principal Problem:   Severe sepsis [facility]) Active Problems:   Sepsis secondary to UTI (HCC)   Open wound of right heel  Severe sepsis secondary to UTI: Presented with hypotension, elevated lactate.  UA suspicious for UTI.  Started on ceftriaxone .  Follow-up urine culture, blood culture.  Urine sent on 8/27 showed multiple species so repeat urine culture requested.  Previously had Klebsiella UTI.  Currently hemodynamically stable with a stable blood pressure.  No leukocytosis or fever this morning  Right heel wound: Right ankle X-ray did not show any osteomyelitis.  Wound care consulted.  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  GI consulted.  May need colonoscopy for biopsy.  Plan for Sunday CT also showed slight thickened and irregular appearance of the left lateral bladder  wall. A urothelial neoplasm is not excluded.  Recommend further evaluation with cystoscopy.  We recommend to follow-up with urology as an outpatient.  CKD stage IV/metabolic acidosis: Given  gentle iv fluid. Added sodium bicarb tabs.  Baseline creatinine around 2.8.  Currently kidney function at baseline  Chronic normocytic anemia/iron  deficiency: Baseline Hb in the range of 8-10.  Hemoglobin dropped to the range of 7.5.  Cannot r/u  occult GI bleed from cecal mass.  GI already following.  No report of hematochezia or melena.  Very low iron .  Will give him IV iron  today.  Continue oral iron  supplementation on discharge..  Type 2 diabetes: On 70/30 insulin  at home.  Continue current insulin  regimen.  Monitor blood sugars  History of hypertension: Hypotensive on presentation.  Coreg on hold.  Currently normotensive  Debility/deconditioning/hip replacement on right/right thalamic ICH with left-sided weakness/Parkinson disease: Lives with family.  Found to be significantly weak than baseline.  PT/OT evaluation requested, commented SNF.  Continue Sinemet  for Parkinson's. TOC consulted for SNF placement         DVT prophylaxis:enoxaparin  (LOVENOX ) injection 30 mg Start: 06/17/24 1000 SCDs Start: 06/16/24 1711     Code Status: Full Code  Family Communication: Called and discussed with wife [name] on phone on 8/28  Patient status:Inpatient  Patient is from :Home  Anticipated discharge to: SnF  Estimated DC date:2-3 days.  Awaiting GI workup   Consultants: GI  Procedures:None yet  Antimicrobials:  Anti-infectives (From admission, onward)    Start     Dose/Rate Route Frequency Ordered Stop   06/17/24 1500  cefTRIAXone  (ROCEPHIN ) 2 g in sodium chloride  0.9 % 100 mL IVPB  2 g 200 mL/hr over 30 Minutes Intravenous Every 24 hours 06/16/24 1710 06/22/24 1459   06/16/24 1500  cefTRIAXone  (ROCEPHIN ) 1 g in sodium chloride  0.9 % 100 mL IVPB        1 g 200 mL/hr over 30 Minutes  Intravenous  Once 06/16/24 1447 06/16/24 1548       Subjective: Patient seen and examined at bedside today.  Very comfortable this morning.  Hemodynamically stable.  On room air.  Denies any shortness of breath or cough.  No abdominal pain today.  No nausea or vomiting.  Tolerating clear liquid diet.  Alert and oriented.   Objective: Vitals:   06/17/24 0556 06/17/24 1400 06/17/24 2000 06/18/24 0620  BP: (!) 129/56 (!) 94/46 (!) 100/55 120/63  Pulse: 72 73 69 82  Resp: 16 16 16 19   Temp: 98 F (36.7 C) 98 F (36.7 C) 98.1 F (36.7 C) 98.6 F (37 C)  TempSrc:  Oral Oral Oral  SpO2: 98% 98% 96% 98%  Weight:      Height:        Intake/Output Summary (Last 24 hours) at 06/18/2024 1045 Last data filed at 06/18/2024 0630 Gross per 24 hour  Intake 2938.47 ml  Output 601 ml  Net 2337.47 ml   Filed Weights   06/16/24 1931  Weight: 78.9 kg    Examination:  General exam: Overall comfortable, not in distress, chronically deconditioned,  HEENT: PERRL Respiratory system:  no wheezes or crackles  Cardiovascular system: S1 & S2 heard, RRR.  Gastrointestinal system: Abdomen is nondistended, soft and nontender.  Bowel sounds present Central nervous system: Alert and oriented Extremities: No edema, no clubbing ,no cyanosis, ulcer on right heel Skin: No rashes, no ulcers,no icterus       Data Reviewed: I have personally reviewed following labs and imaging studies  CBC: Recent Labs  Lab 06/16/24 1208 06/17/24 0504 06/18/24 0513  WBC 11.7* 8.8 12.5*  NEUTROABS 8.6*  --   --   HGB 9.2* 7.5* 7.6*  HCT 30.6* 24.5* 24.7*  MCV 100.7* 101.7* 100.8*  PLT 505* 380 395   Basic Metabolic Panel: Recent Labs  Lab 06/16/24 1208 06/17/24 0504 06/18/24 0513  NA 137 138 136  K 4.7 4.3 4.5  CL 103 106 104  CO2 19* 21* 20*  GLUCOSE 150* 109* 111*  BUN 43* 40* 38*  CREATININE 2.85* 2.60* 2.48*  CALCIUM  8.3* 7.6* 7.6*     Recent Results (from the past 240 hours)  Blood culture  (routine x 2)     Status: None (Preliminary result)   Collection Time: 06/16/24 12:08 PM   Specimen: BLOOD  Result Value Ref Range Status   Specimen Description   Final    BLOOD SITE NOT SPECIFIED Performed at [facility], [address], [place], [place] [address]    Special Requests   Final    BOTTLES DRAWN AEROBIC AND ANAEROBIC Blood Culture adequate volume Performed at [facility], [address], [place], [place] [address]    Culture   Final    NO GROWTH 2 DAYS Performed at [facility], [address], [place], [place] [address]    Report Status PENDING  Incomplete  Blood culture (routine x 2)     Status: None (Preliminary result)   Collection Time: 06/16/24  3:17 PM   Specimen: BLOOD  Result Value Ref Range Status   Specimen Description   Final    BLOOD SITE NOT SPECIFIED Performed  at [facility], [address], [place], [place] [address]    Special Requests   Final    BOTTLES DRAWN AEROBIC AND ANAEROBIC Blood Culture adequate volume Performed at [facility], [address] [name], [place] [address]    Culture   Final    NO GROWTH 2 DAYS Performed at [facility], [address], [place], [place] [address]    Report Status PENDING  Incomplete  Urine Culture     Status: Abnormal   Collection Time: 06/16/24  8:36 PM   Specimen: Urine, Clean Catch  Result Value Ref Range Status   Specimen Description   Final    URINE, CLEAN CATCH Performed at [facility], [address], [place], [place] [address]    Special Requests   Final    NONE Performed at [facility], [address], [place], [place] [address]    Culture MULTIPLE SPECIES PRESENT, SUGGEST RECOLLECTION (A)  Final   Report Status 06/17/2024 FINAL  Final  MRSA Next Gen by PCR, Nasal     Status: None   Collection Time: 06/16/24  8:36 PM   Specimen: Urine, Clean Catch; Nasal Swab   Result Value Ref Range Status   MRSA by PCR Next Gen NOT DETECTED NOT DETECTED Final    Comment: (NOTE) The GeneXpert MRSA Assay (FDA approved for NASAL specimens only), is one component of a comprehensive MRSA colonization surveillance program. It is not intended to diagnose MRSA infection nor to guide or monitor treatment for MRSA infections. Test performance is not FDA approved in patients less than 93 years old. Performed at [facility], [address], [place], [place] [address]      Radiology Studies: DG Ankle 2 Views Right Result Date: 06/16/2024 CLINICAL DATA:  Repeat lateral image of the right ankle. EXAM: RIGHT ANKLE - 2 VIEW COMPARISON:  Same day radiographs of the right ankle dated 06/16/2024 at 12:13 p.m. FINDINGS: Soft tissue defect/wound overlying the posterior calcaneus. No radiographic evidence of acute osteolysis or erosive changes. Plantar calcaneal spur. IMPRESSION: Soft tissue defect/wound overlying the posterior calcaneus without radiographic evidence to suggest acute osteomyelitis at this time. Electronically Signed   By: Harrietta Sherry M.D.   On: 06/16/2024 14:35   CT ABDOMEN PELVIS WO CONTRAST Result Date: 06/16/2024 CLINICAL DATA:  Abdominal pain, nausea vomiting and diarrhea. EXAM: CT ABDOMEN AND PELVIS WITHOUT CONTRAST TECHNIQUE: Multidetector CT imaging of the abdomen and pelvis was performed following the standard protocol without IV contrast. RADIATION DOSE REDUCTION: This exam was performed according to the departmental dose-optimization program which includes automated exposure control, adjustment of the mA and/or kV according to patient size and/or use of iterative reconstruction technique. COMPARISON:  CT abdomen pelvis dated 02/13/2023. FINDINGS: Evaluation of this exam is limited in the absence of intravenous contrast. Lower chest: Partially visualized small left pleural effusion. There is coronary vascular calcification. No  intra-abdominal free air or free fluid. Hepatobiliary: Small cyst in the left lobe of the liver. No biliary dilatation. The gallbladder is unremarkable. Pancreas: Unremarkable. No pancreatic ductal dilatation or surrounding inflammatory changes. Spleen: Normal in size without focal abnormality. Adrenals/Urinary Tract: The adrenal glands unremarkable. No significant interval change in the left renal interpolar cyst. Tiny nonobstructing left renal calculi measure up to 2 mm. No hydronephrosis. There is no hydronephrosis or nephrolithiasis on the right. The visualized ureters appear unremarkable. There is slight thickened and irregular appearance of the left lateral bladder  wall which may be related to underdistention. A urothelial neoplasm is not excluded. Further evaluation with cystoscopy is recommended. Stomach/Bowel: Significant interval increase in the size of the cecal mass seen on the prior CT filling of the cecum and measuring approximately 4.3 x 8.0 cm (coronal 60/8). This is consistent with a colonic malignancy. Further evaluation with colonoscopy is recommended. There is sigmoid diverticulosis without active inflammatory changes. There is no bowel obstruction. The appendix is not visualized with certainty. No inflammatory changes identified in the right lower quadrant. For Vascular/Lymphatic: Moderate aortoiliac atherosclerotic disease. The IVC is unremarkable. No portal venous gas. There is no adenopathy. A subcentimeter lymph node posterior to the cecum and anterior to the right psoas muscle measures 6 mm in short axis slightly increased since the prior CT. Reproductive: The prostate and seminal vesicles are grossly unremarkable. Penile implant with reservoir in the anterior pelvis. Other: None Musculoskeletal: Osteopenia with degenerative changes of the spine. Total right hip arthroplasty. No acute osseous pathology. IMPRESSION: 1. Significant interval increase in the size of the cecal mass consistent  with a colonic malignancy. Further evaluation with colonoscopy is recommended. 2. Sigmoid diverticulosis. No bowel obstruction. 3. Tiny nonobstructing left renal calculi. No hydronephrosis. 4. Slight thickened and irregular appearance of the left lateral bladder wall. A urothelial neoplasm is not excluded. Further evaluation with cystoscopy is recommended. 5.  Aortic Atherosclerosis (ICD10-I70.0). Electronically Signed   By: Vanetta Chou M.D.   On: 06/16/2024 14:00   DG Ankle Complete Right Result Date: 06/16/2024 EXAM: 3 or more VIEW(S) XRAY OF THE RIGHT ANKLE 06/16/2024 12:17:00 PM CLINICAL HISTORY: Weakness, wound infection - He was released from [facility], post rehab for broken hip recently. Since discharge, he has experienced weakness. He also had nausea, vomiting and diarrhea a few days ago. Pt has a wound on his right anterior ankle from; his leg being in traction from a recent right hip fracture. COMPARISON: None available. FINDINGS: BONES AND JOINTS: Calcaneal spur. No acute fracture. No joint dislocation. SOFT TISSUES: Mild diffuse soft tissue swelling. Soft tissue defect is noted posterior to the calcaneus with underlying focal area of osteopenia involving the posterior calcaneus. Note: this area is obscured by external artifact. If there is a focal wound in this area, I would suggest repeating the lateral projection radiograph of the ankle after removal of the external artifact to confirm presence of focal bone erosion which may reflect osteomyelitis of the calcaneus. IMPRESSION: 1. Soft tissue defect posterior to the calcaneus with underlying focal osteopenia, obscured by external artifact. If a focal wound is present in this area, I would recommend repeat lateral radiograph after artifact removal to confirm possible bone erosion, which may reflect osteomyelitis. 2. Mild diffuse soft tissue swelling. Electronically signed by: Waddell Calk MD 06/16/2024 12:50 PM EDT RP Workstation: [id number]    DG Chest Portable 1 View Result Date: 06/16/2024 EXAM: 1 VIEW XRAY OF THE CHEST 06/16/2024 12:17:00 PM COMPARISON: 03/23/2024 CLINICAL HISTORY: Weakness, wound infection. Patient was released from [facility], post rehab for broken hip recently. Since discharge, he has experienced weakness. He also had nausea, vomiting and diarrhea a few days ago. Patient has a wound on his right anterior ankle from his leg being in traction from a recent right hip fracture. FINDINGS: LUNGS AND PLEURA: Low lung volumes with elevated left hemidiaphragm. Mild left lung base atelectasis. No focal pulmonary opacity. No pulmonary edema. No pleural effusion. No pneumothorax. HEART AND MEDIASTINUM: Aortic atherosclerosis. No acute abnormality of the cardiac and mediastinal  silhouettes. BONES AND SOFT TISSUES: Remote healed left lateral rib fracture deformity. No acute osseous abnormality. IMPRESSION: 1. Low lung volumes with elevated left hemidiaphragm and mild left lung base atelectasis. Electronically signed by: Waddell Calk MD 06/16/2024 12:46 PM EDT RP Workstation: [id number]    Scheduled Meds:  bisacodyl   10 mg Oral Once   carbidopa -levodopa   1 tablet Oral BID   cholecalciferol   1,000 Units Oral Daily   enoxaparin  (LOVENOX ) injection  30 mg Subcutaneous Q24H   escitalopram   10 mg Oral Daily   folic acid   1 mg Oral Daily   Gerhardt's butt cream   Topical TID   insulin  aspart  0-5 Units Subcutaneous QHS   insulin  aspart  0-9 Units Subcutaneous TID WC   insulin  aspart protamine- aspart  7 Units Subcutaneous BID WC   leptospermum manuka honey  1 Application Topical Daily   multivitamin with minerals  1 tablet Oral Daily   Na Sulfate-K Sulfate-Mg Sulfate concentrate  0.5 kit Oral Once   Followed by   Na Sulfate-K Sulfate-Mg Sulfate concentrate  0.5 kit Oral Once   polyethylene glycol  17 g Oral Daily   pramipexole   0.25 mg Oral QHS   rosuvastatin   10 mg Oral QHS   sodium bicarbonate   650 mg Oral TID    tamsulosin   0.4 mg Oral Daily   Continuous Infusions:  sodium chloride      cefTRIAXone  (ROCEPHIN )  IV 2 g (06/17/24 1611)     LOS: 2 days   Ivonne Mustache, MD [facility] P8/29/2025, 10:45 AM

## 2024-06-19 LAB — CBC
HCT: 26.2 % — ABNORMAL LOW (ref 39.0–52.0)
Hemoglobin: 7.8 g/dL — ABNORMAL LOW (ref 13.0–17.0)
MCH: 30.4 pg (ref 26.0–34.0)
MCHC: 29.8 g/dL — ABNORMAL LOW (ref 30.0–36.0)
MCV: 101.9 fL — ABNORMAL HIGH (ref 80.0–100.0)
Platelets: 394 K/uL (ref 150–400)
RBC: 2.57 MIL/uL — ABNORMAL LOW (ref 4.22–5.81)
RDW: 16.8 % — ABNORMAL HIGH (ref 11.5–15.5)
WBC: 8.1 K/uL (ref 4.0–10.5)
nRBC: 0.2 % (ref 0.0–0.2)

## 2024-06-19 LAB — BASIC METABOLIC PANEL WITH GFR
Anion gap: 16 — ABNORMAL HIGH (ref 5–15)
BUN: 33 mg/dL — ABNORMAL HIGH (ref 8–23)
CO2: 22 mmol/L (ref 22–32)
Calcium: 7.9 mg/dL — ABNORMAL LOW (ref 8.9–10.3)
Chloride: 106 mmol/L (ref 98–111)
Creatinine, Ser: 2.43 mg/dL — ABNORMAL HIGH (ref 0.61–1.24)
GFR, Estimated: 26 mL/min — ABNORMAL LOW (ref >60)
Glucose, Bld: 103 mg/dL — ABNORMAL HIGH (ref 70–99)
Potassium: 3.6 mmol/L (ref 3.5–5.1)
Sodium: 144 mmol/L (ref 135–145)

## 2024-06-19 LAB — GLUCOSE, CAPILLARY
Glucose-Capillary: 98 mg/dL (ref 70–99)
Glucose-Capillary: 105 mg/dL — ABNORMAL HIGH (ref 70–99)
Glucose-Capillary: 96 mg/dL (ref 70–99)
Glucose-Capillary: 178 mg/dL — ABNORMAL HIGH (ref 70–99)

## 2024-06-19 LAB — URINE CULTURE

## 2024-06-19 MED ORDER — BISACODYL 5 MG PO TBEC
10.0000 mg | DELAYED_RELEASE_TABLET | Freq: Once | ORAL | Status: AC
  Administered 2024-06-19: 10 mg via ORAL
  Filled 2024-06-19: qty 2

## 2024-06-19 MED ORDER — NA SULFATE-K SULFATE-MG SULF 17.5-3.13-1.6 GM/177ML PO SOLN
0.5000 | Freq: Once | ORAL | Status: AC
  Administered 2024-06-19: 177 mL via ORAL
  Filled 2024-06-19: qty 1

## 2024-06-19 MED ORDER — SODIUM CHLORIDE 0.9 % IV SOLN: INTRAVENOUS | Status: AC

## 2024-06-19 MED ORDER — NA SULFATE-K SULFATE-MG SULF 17.5-3.13-1.6 GM/177ML PO SOLN
0.5000 | Freq: Once | ORAL | Status: AC
  Administered 2024-06-19: 177 mL via ORAL

## 2024-06-19 NOTE — TOC Progression Note (Signed)
 Transition of Care [facility]) - Progression Note    Patient Details  Name: Jake Bartlett MRN: [id number] Date of Birth: 04-16-1941  Transition of [facility]) CM/SW Contact  Sheri ONEIDA [place], [place] Phone Number: 06/19/2024, 4:29 PM  Clinical Narrative:    CSW presented bed offers to pt and pt wife. Pt stated that he would ideally like to return home w/ HH. HH was setup w/ [facility] prior to pt hospitalization. Requested time to review bed offers. IPCM to f/u on decision HHPT vs SNF and bed choice.    Expected Discharge Plan: Skilled Nursing Facility Barriers to Discharge: Continued Medical Work up               Expected Discharge Plan and Services     Post Acute Care Choice: Skilled Nursing Facility Living arrangements for the past 2 months: Single Family Home                                       Social Drivers of Health (SDOH) Interventions SDOH Screenings   Food Insecurity: No Food Insecurity (06/16/2024)  Housing: Low Risk  (06/16/2024)  Transportation Needs: No Transportation Needs (06/16/2024)  Utilities: Not At Risk (06/16/2024)  Financial Resource Strain: Low Risk  (12/05/2023)   Received from [facility]  Physical Activity: Insufficiently Active (06/24/2023)   Received from [facility]  Social Connections: Moderately Integrated (06/16/2024)  Stress: No Stress Concern Present (06/24/2023)   Received from [facility]  Tobacco Use: Low Risk  (06/17/2024)    Readmission Risk Interventions    06/19/2024    4:28 PM  Readmission Risk Prevention Plan  Post Dischage Appt Complete  Medication Screening Complete  Transportation Screening Complete

## 2024-06-19 NOTE — Anesthesia Preprocedure Evaluation (Signed)
 Anesthesia Evaluation  Patient identified by MRN, date of birth, ID band  Reviewed: Allergy & Precautions, NPO status , Patient's Chart, lab work & pertinent test results, reviewed documented beta blocker date and time   History of Anesthesia Complications Negative for: history of anesthetic complications  Airway Mallampati: III  TM Distance: >3 FB Neck ROM: Full    Dental  (+) Missing,    Pulmonary neg pulmonary ROS   Pulmonary exam normal        Cardiovascular hypertension, Pt. on medications and Pt. on home beta blockers Normal cardiovascular exam     Neuro/Psych Parkinson's CVA (right thalamic intracranial hemorrhage with left-sided weakness), Residual Symptoms    GI/Hepatic negative GI ROS, Neg liver ROS,,,cecal mass on CT   Endo/Other  diabetes, Type 2    Renal/GU Renal Insufficiency and CRFRenal disease (Cr 2.43, admitted with UTI/sepsis)  negative genitourinary   Musculoskeletal negative musculoskeletal ROS (+)    Abdominal   Peds  Hematology  (+) Blood dyscrasia (Hgb 7.8), anemia   Anesthesia Other Findings Day of surgery medications reviewed with patient.  Reproductive/Obstetrics                              Anesthesia Physical Anesthesia Plan  ASA: 4 and emergent  Anesthesia Plan: MAC   Post-op Pain Management: Minimal or no pain anticipated   Induction:   PONV Risk Score and Plan: 1 and Treatment may vary due to age or medical condition and Propofol  infusion  Airway Management Planned: Natural Airway and Simple Face Mask  Additional Equipment: None  Intra-op Plan:   Post-operative Plan:   Informed Consent: I have reviewed the patients History and Physical, chart, labs and discussed the procedure including the risks, benefits and alternatives for the proposed anesthesia with the patient or authorized representative who has indicated his/her understanding and  acceptance.       Plan Discussed with: CRNA  Anesthesia Plan Comments:          Anesthesia Quick Evaluation

## 2024-06-19 NOTE — Progress Notes (Signed)
 Subjective: Patient was able to tolerate Suprep with multiple loose bowel movements, but stool is not clear yet.  Objective: Vital signs in last 24 hours: Temp:  [98 F (36.7 C)-98.6 F (37 C)] 98.6 F (37 C) (08/30 0553) Pulse Rate:  [65-88] 65 (08/30 0553) Resp:  [16-18] 18 (08/30 0553) BP: (113-132)/(56-61) 132/56 (08/30 0553) SpO2:  [95 %-99 %] 99 % (08/30 0553) Weight change:  Last BM Date : 06/19/24  PE: General debility and deconditioning noted GENERAL: Prominent pallor  ABDOMEN: Nondistended, nontender, normoactive bowel sounds EXTREMITIES: Pressure boots on bilateral lower extremities  Lab Results: Results for orders placed or performed during the hospital encounter of 06/16/24 (from the past 48 hours)  Glucose, capillary     Status: Abnormal   Collection Time: 06/17/24 11:30 AM  Result Value Ref Range   Glucose-Capillary 103 (H) 70 - 99 mg/dL    Comment: Glucose reference range applies only to samples taken after fasting for at least 8 hours.  Glucose, capillary     Status: Abnormal   Collection Time: 06/17/24  4:29 PM  Result Value Ref Range   Glucose-Capillary 169 (H) 70 - 99 mg/dL    Comment: Glucose reference range applies only to samples taken after fasting for at least 8 hours.  Glucose, capillary     Status: Abnormal   Collection Time: 06/17/24  9:43 PM  Result Value Ref Range   Glucose-Capillary 107 (H) 70 - 99 mg/dL    Comment: Glucose reference range applies only to samples taken after fasting for at least 8 hours.  CBC     Status: Abnormal   Collection Time: 06/18/24  5:13 AM  Result Value Ref Range   WBC 12.5 (H) 4.0 - 10.5 K/uL   RBC 2.45 (L) 4.22 - 5.81 MIL/uL   Hemoglobin 7.6 (L) 13.0 - 17.0 g/dL   HCT 75.2 (L) 60.9 - 47.9 %   MCV 100.8 (H) 80.0 - 100.0 fL   MCH 31.0 26.0 - 34.0 pg   MCHC 30.8 30.0 - 36.0 g/dL   RDW 83.1 (H) 88.4 - 84.4 %   Platelets 395 150 - 400 K/uL   nRBC 0.0 0.0 - 0.2 %    Comment: Performed at [facility], [address], [place], [place] [address]  Basic metabolic panel with GFR     Status: Abnormal   Collection Time: 06/18/24  5:13 AM  Result Value Ref Range   Sodium 136 135 - 145 mmol/L   Potassium 4.5 3.5 - 5.1 mmol/L    Comment: HEMOLYSIS AT THIS LEVEL MAY AFFECT RESULT   Chloride 104 98 - 111 mmol/L   CO2 20 (L) 22 - 32 mmol/L   Glucose, Bld 111 (H) 70 - 99 mg/dL    Comment: Glucose reference range applies only to samples taken after fasting for at least 8 hours.   BUN 38 (H) 8 - 23 mg/dL   Creatinine, Ser 7.51 (H) 0.61 - 1.24 mg/dL   Calcium  7.6 (L) 8.9 - 10.3 mg/dL   GFR, Estimated 25 (L) >60 mL/min    Comment: (NOTE) Calculated using the CKD-EPI Creatinine Equation (2021)    Anion gap 12 5 - 15    Comment: Performed at [facility], [address], [place], [place] [address]  Iron  and TIBC     Status: Abnormal   Collection Time: 06/18/24  5:13 AM  Result Value Ref Range   Iron  18 (L) 45 - 182 ug/dL  TIBC 135 (L) 250 - 450 ug/dL   Saturation Ratios 13 (L) 17.9 - 39.5 %   UIBC 117 ug/dL    Comment: Performed at [facility], [address] [place], [place] [address]  Glucose, capillary     Status: Abnormal   Collection Time: 06/18/24  7:42 AM  Result Value Ref Range   Glucose-Capillary 118 (H) 70 - 99 mg/dL    Comment: Glucose reference range applies only to samples taken after fasting for at least 8 hours.  Glucose, capillary     Status: Abnormal   Collection Time: 06/18/24 11:33 AM  Result Value Ref Range   Glucose-Capillary 105 (H) 70 - 99 mg/dL    Comment: Glucose reference range applies only to samples taken after fasting for at least 8 hours.  Glucose, capillary     Status: None   Collection Time: 06/18/24  4:26 PM  Result Value Ref Range   Glucose-Capillary 86 70 - 99 mg/dL    Comment: Glucose reference range applies only to samples taken after fasting for at least 8 hours.  Glucose, capillary     Status: None    Collection Time: 06/18/24  8:21 PM  Result Value Ref Range   Glucose-Capillary 82 70 - 99 mg/dL    Comment: Glucose reference range applies only to samples taken after fasting for at least 8 hours.  CBC     Status: Abnormal   Collection Time: 06/19/24  6:05 AM  Result Value Ref Range   WBC 8.1 4.0 - 10.5 K/uL   RBC 2.57 (L) 4.22 - 5.81 MIL/uL   Hemoglobin 7.8 (L) 13.0 - 17.0 g/dL   HCT 73.7 (L) 60.9 - 47.9 %   MCV 101.9 (H) 80.0 - 100.0 fL   MCH 30.4 26.0 - 34.0 pg   MCHC 29.8 (L) 30.0 - 36.0 g/dL   RDW 83.1 (H) 88.4 - 84.4 %   Platelets 394 150 - 400 K/uL   nRBC 0.2 0.0 - 0.2 %    Comment: Performed at [facility], [address] [place], [place] [address]  Basic metabolic panel with GFR     Status: Abnormal   Collection Time: 06/19/24  6:05 AM  Result Value Ref Range   Sodium 144 135 - 145 mmol/L    Comment: Delta check noted    Potassium 3.6 3.5 - 5.1 mmol/L   Chloride 106 98 - 111 mmol/L   CO2 22 22 - 32 mmol/L   Glucose, Bld 103 (H) 70 - 99 mg/dL    Comment: Glucose reference range applies only to samples taken after fasting for at least 8 hours.   BUN 33 (H) 8 - 23 mg/dL   Creatinine, Ser 7.56 (H) 0.61 - 1.24 mg/dL   Calcium  7.9 (L) 8.9 - 10.3 mg/dL   GFR, Estimated 26 (L) >60 mL/min    Comment: (NOTE) Calculated using the CKD-EPI Creatinine Equation (2021)    Anion gap 16 (H) 5 - 15    Comment: Performed at [facility], [address] [place], [place] [address]  Glucose, capillary     Status: None   Collection Time: 06/19/24  7:16 AM  Result Value Ref Range   Glucose-Capillary 98 70 - 99 mg/dL    Comment: Glucose reference range applies only to samples taken after fasting for at least 8 hours.    Studies/Results: No results found.  Medications: I have reviewed the patient's current medications.  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

## 2024-06-19 NOTE — Progress Notes (Signed)
 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  On arrival to the ER he was hypotensive.  Creatinine was 2.8, lactic acid was 2.7.  UA was abnormal.  Patient was started on antibiotics and admitted to the hospital.  A CT scan abdomen pelvis showed significant increase in the cecal mass consistent with colonic malignancy.  GI was consulted.  Subjective: Patient seen and examined.  Looks very frail and debilitated.  He did not have any complaints.  No family at the bedside.  No other overnight events.  He was able to take the bowel prep.  Assessment & Plan:   Cecal mass, probable urothelial mass.  Suspected colon cancer. CT scan with increasing size of the cecal mass consistent with a colonic malignancy-prolonged prep and colonoscopy scheduled for tomorrow. Irregular appearance of the left lateral bladder wall-cystoscopy recommended, hopefully colonoscopy will give answer. Not sure patient is a surgical candidate if found to have colon cancer, may help to prognosticate.  Acute UTI present on admission, severe sepsis suspected present on admission due to hypotension, lactic acidosis. Urine culture with multiple species.  Blood cultures negative.  Previously Klebsiella UTI.  Patient on Rocephin .  With multiple bacteria, will complete 5 days of IV Rocephin .  Right heel wound: Local wound care.  CKD stage IV: Kidney function at baseline.  Monitor.  Acute on chronic normocytic anemia, iron  deficiency: Hemoglobin at baseline 8-10.  Hemoglobin 7.5.  No evidence of overt bleeding.  Given IV iron .  Will monitor and transfuse if less than 7.  Type  2 diabetes, well-controlled on insulin  at home.  Continue 70/30 insulin .  Essential hypertension: Blood pressure stable.  Meds on hold.  Debility, deconditioning, history of stroke and Parkinson's: Lives at home.  Significantly weak than baseline.  Refer to SNF. Will consult palliative care team if diagnosed with cancer as he is not likely a surgical candidate.    DVT prophylaxis: heparin  injection 5,000 Units Start: 06/19/24 0600 SCDs Start: 06/16/24 1711   Code Status: Full code Family Communication: None at the bedside Disposition Plan: Status is: Inpatient Remains inpatient appropriate because: Inpatient procedures planned     Consultants:  GI  Procedures:  None  Antimicrobials:  Rocephin  8/27---      Objective: Vitals:   06/18/24 0620 06/18/24 1400 06/18/24 2023 06/19/24 0553  BP: 120/63 121/61 (!) 113/58 (!) 132/56  Pulse: 82 78 88 65  Resp: 19 16 18 18   Temp: 98.6 F (37 C) 98 F (36.7 C) 98.1 F (36.7 C) 98.6 F (37 C)  TempSrc: Oral  Oral   SpO2: 98% 96% 95% 99%  Weight:      Height:        Intake/Output Summary (Last 24 hours) at 06/19/2024 1130 Last data filed at 06/19/2024 1100 Gross per 24 hour  Intake 1004.62 ml  Output 700 ml  Net 304.62 ml   Filed Weights   06/16/24 1931  Weight: 78.9 kg    Examination:  General exam: Appears calm and comfortable.  Frail and debilitated.  Chronically sick looking gentleman. Respiratory system: Clear to auscultation. Respiratory effort normal.  No added sounds. Cardiovascular system: S1 & S2 heard, RRR.  Gastrointestinal system: Soft.  Nontender.  Bowel sound present. Central nervous system: Alert and oriented.  Flat affect.   Data Reviewed: I have personally reviewed following labs and imaging studies  CBC: Recent Labs  Lab 06/16/24 1208 06/17/24 0504 06/18/24 0513 06/19/24 0605  WBC 11.7* 8.8 12.5* 8.1  NEUTROABS 8.6*  --   --   --   HGB 9.2* 7.5* 7.6* 7.8*  HCT 30.6* 24.5* 24.7* 26.2*   MCV 100.7* 101.7* 100.8* 101.9*  PLT 505* 380 395 394   Basic Metabolic Panel: Recent Labs  Lab 06/16/24 1208 06/17/24 0504 06/18/24 0513 06/19/24 0605  NA 137 138 136 144  K 4.7 4.3 4.5 3.6  CL 103 106 104 106  CO2 19* 21* 20* 22  GLUCOSE 150* 109* 111* 103*  BUN 43* 40* 38* 33*  CREATININE 2.85* 2.60* 2.48* 2.43*  CALCIUM  8.3* 7.6* 7.6* 7.9*   GFR: Estimated Creatinine Clearance: 21.9 mL/min (A) (by C-G formula based on SCr of 2.43 mg/dL (H)). Liver Function Tests: Recent Labs  Lab 06/16/24 1208  AST 25  ALT <5  ALKPHOS 158*  BILITOT 0.4  PROT 6.4*  ALBUMIN  2.7*   Recent Labs  Lab 06/16/24 1208  LIPASE 37   No results for input(s): AMMONIA in the last 168 hours. Coagulation Profile: No results for input(s): INR, PROTIME in the last 168 hours. Cardiac Enzymes: No results for input(s): CKTOTAL, CKMB, CKMBINDEX, TROPONINI in the last 168 hours. BNP (last 3 results) No results for input(s): PROBNP in the last 8760 hours. HbA1C: Recent Labs    06/16/24 1926  HGBA1C 5.8*   CBG: Recent Labs  Lab 06/18/24 0742 06/18/24 1133 06/18/24 1626 06/18/24 2021 06/19/24 0716  GLUCAP 118* 105* 86 82 98   Lipid Profile: No results for input(s): CHOL, HDL, LDLCALC, TRIG, CHOLHDL, LDLDIRECT in the last 72 hours. Thyroid Function Tests: No results for input(s): TSH, T4TOTAL, FREET4, T3FREE, THYROIDAB in the last 72 hours. Anemia Panel: Recent Labs    06/18/24 0513  TIBC 135*  IRON  18*   Sepsis Labs: Recent Labs  Lab 06/16/24 1215 06/16/24 1511  LATICACIDVEN 2.7* 1.4    Recent Results (from the past 240 hours)  Blood culture (routine x 2)     Status: None (Preliminary result)   Collection Time: 06/16/24 12:08 PM   Specimen: BLOOD  Result Value Ref Range Status   Specimen Description   Final    BLOOD SITE NOT SPECIFIED Performed at [facility], [address] [place], [place] [address]     Special Requests   Final    BOTTLES DRAWN AEROBIC AND ANAEROBIC Blood Culture adequate volume Performed at [facility], [address], [place], [place] [address]    Culture   Final    NO GROWTH 3 DAYS Performed at [facility], [address] [place], [place] [address]    Report Status PENDING  Incomplete  Blood culture (routine x 2)     Status: None (Preliminary result)   Collection Time: 06/16/24  3:17 PM   Specimen: BLOOD  Result Value Ref Range Status   Specimen Description   Final    BLOOD SITE NOT SPECIFIED Performed at [facility], [address], [place], [place] [address]    Special Requests   Final    BOTTLES DRAWN AEROBIC AND ANAEROBIC Blood Culture adequate volume Performed at [facility], [address] [place], [place] [address]    Culture   Final    NO  GROWTH 3 DAYS Performed at [facility], [address], [place], [place] [address]    Report Status PENDING  Incomplete  Urine Culture     Status: Abnormal   Collection Time: 06/16/24  8:36 PM   Specimen: Urine, Clean Catch  Result Value Ref Range Status   Specimen Description   Final    URINE, CLEAN CATCH Performed at [facility], [address], [place], [place] [address]    Special Requests   Final    NONE Performed at [facility], [address] [place], [place] [address]    Culture MULTIPLE SPECIES PRESENT, SUGGEST RECOLLECTION (A)  Final   Report Status 06/17/2024 FINAL  Final  MRSA Next Gen by PCR, Nasal     Status: None   Collection Time: 06/16/24  8:36 PM   Specimen: Urine, Clean Catch; Nasal Swab  Result Value Ref Range Status   MRSA by PCR Next Gen NOT DETECTED NOT DETECTED Final    Comment: (NOTE) The GeneXpert MRSA Assay (FDA approved for NASAL specimens only), is one component of a comprehensive MRSA colonization surveillance program. It is not intended to diagnose MRSA  infection nor to guide or monitor treatment for MRSA infections. Test performance is not FDA approved in patients less than 6 years old. Performed at [facility], [address] [place], [place] [address]          Radiology Studies: No results found.      Scheduled Meds:  bisacodyl   10 mg Oral Once   carbidopa -levodopa   1 tablet Oral BID   cholecalciferol   1,000 Units Oral Daily   escitalopram   10 mg Oral Daily   folic acid   1 mg Oral Daily   Gerhardt's butt cream   Topical TID   heparin  injection (subcutaneous)  5,000 Units Subcutaneous Q8H   insulin  aspart  0-5 Units Subcutaneous QHS   insulin  aspart  0-9 Units Subcutaneous TID WC   insulin  aspart protamine- aspart  7 Units Subcutaneous BID WC   leptospermum manuka honey  1 Application Topical Daily   multivitamin with minerals  1 tablet Oral Daily   Na Sulfate-K Sulfate-Mg Sulfate concentrate  0.5 kit Oral Once   Followed by   Na Sulfate-K Sulfate-Mg Sulfate concentrate  0.5 kit Oral Once   polyethylene glycol  17 g Oral Daily   pramipexole   0.25 mg Oral QHS   rosuvastatin   10 mg Oral QHS   sodium bicarbonate   650 mg Oral TID   tamsulosin   0.4 mg Oral Daily   Continuous Infusions:  sodium chloride      sodium chloride      cefTRIAXone  (ROCEPHIN )  IV 2 g (06/18/24 1335)     LOS: 3 days    Time spent: 40 minutes    Renato Applebaum, MD [facility]

## 2024-06-19 NOTE — H&P (View-Only) (Signed)
 Subjective: Patient was able to tolerate Suprep with multiple loose bowel movements, but stool is not clear yet.  Objective: Vital signs in last 24 hours: Temp:  [98 F (36.7 C)-98.6 F (37 C)] 98.6 F (37 C) (08/30 0553) Pulse Rate:  [65-88] 65 (08/30 0553) Resp:  [16-18] 18 (08/30 0553) BP: (113-132)/(56-61) 132/56 (08/30 0553) SpO2:  [95 %-99 %] 99 % (08/30 0553) Weight change:  Last BM Date : 06/19/24  PE: General debility and deconditioning noted GENERAL: Prominent pallor  ABDOMEN: Nondistended, nontender, normoactive bowel sounds EXTREMITIES: Pressure boots on bilateral lower extremities  Lab Results: Results for orders placed or performed during the hospital encounter of 06/16/24 (from the past 48 hours)  Glucose, capillary     Status: Abnormal   Collection Time: 06/17/24 11:30 AM  Result Value Ref Range   Glucose-Capillary 103 (H) 70 - 99 mg/dL    Comment: Glucose reference range applies only to samples taken after fasting for at least 8 hours.  Glucose, capillary     Status: Abnormal   Collection Time: 06/17/24  4:29 PM  Result Value Ref Range   Glucose-Capillary 169 (H) 70 - 99 mg/dL    Comment: Glucose reference range applies only to samples taken after fasting for at least 8 hours.  Glucose, capillary     Status: Abnormal   Collection Time: 06/17/24  9:43 PM  Result Value Ref Range   Glucose-Capillary 107 (H) 70 - 99 mg/dL    Comment: Glucose reference range applies only to samples taken after fasting for at least 8 hours.  CBC     Status: Abnormal   Collection Time: 06/18/24  5:13 AM  Result Value Ref Range   WBC 12.5 (H) 4.0 - 10.5 K/uL   RBC 2.45 (L) 4.22 - 5.81 MIL/uL   Hemoglobin 7.6 (L) 13.0 - 17.0 g/dL   HCT 75.2 (L) 60.9 - 47.9 %   MCV 100.8 (H) 80.0 - 100.0 fL   MCH 31.0 26.0 - 34.0 pg   MCHC 30.8 30.0 - 36.0 g/dL   RDW 83.1 (H) 88.4 - 84.4 %   Platelets 395 150 - 400 K/uL   nRBC 0.0 0.0 - 0.2 %    Comment: Performed at [facility], [address], [place], [place] [address]  Basic metabolic panel with GFR     Status: Abnormal   Collection Time: 06/18/24  5:13 AM  Result Value Ref Range   Sodium 136 135 - 145 mmol/L   Potassium 4.5 3.5 - 5.1 mmol/L    Comment: HEMOLYSIS AT THIS LEVEL MAY AFFECT RESULT   Chloride 104 98 - 111 mmol/L   CO2 20 (L) 22 - 32 mmol/L   Glucose, Bld 111 (H) 70 - 99 mg/dL    Comment: Glucose reference range applies only to samples taken after fasting for at least 8 hours.   BUN 38 (H) 8 - 23 mg/dL   Creatinine, Ser 7.51 (H) 0.61 - 1.24 mg/dL   Calcium  7.6 (L) 8.9 - 10.3 mg/dL   GFR, Estimated 25 (L) >60 mL/min    Comment: (NOTE) Calculated using the CKD-EPI Creatinine Equation (2021)    Anion gap 12 5 - 15    Comment: Performed at [facility], [address], [place], [place] [address]  Iron  and TIBC     Status: Abnormal   Collection Time: 06/18/24  5:13 AM  Result Value Ref Range   Iron  18 (L) 45 - 182 ug/dL  TIBC 135 (L) 250 - 450 ug/dL   Saturation Ratios 13 (L) 17.9 - 39.5 %   UIBC 117 ug/dL    Comment: Performed at [facility], [address] [place], [place] [address]  Glucose, capillary     Status: Abnormal   Collection Time: 06/18/24  7:42 AM  Result Value Ref Range   Glucose-Capillary 118 (H) 70 - 99 mg/dL    Comment: Glucose reference range applies only to samples taken after fasting for at least 8 hours.  Glucose, capillary     Status: Abnormal   Collection Time: 06/18/24 11:33 AM  Result Value Ref Range   Glucose-Capillary 105 (H) 70 - 99 mg/dL    Comment: Glucose reference range applies only to samples taken after fasting for at least 8 hours.  Glucose, capillary     Status: None   Collection Time: 06/18/24  4:26 PM  Result Value Ref Range   Glucose-Capillary 86 70 - 99 mg/dL    Comment: Glucose reference range applies only to samples taken after fasting for at least 8 hours.  Glucose, capillary     Status: None    Collection Time: 06/18/24  8:21 PM  Result Value Ref Range   Glucose-Capillary 82 70 - 99 mg/dL    Comment: Glucose reference range applies only to samples taken after fasting for at least 8 hours.  CBC     Status: Abnormal   Collection Time: 06/19/24  6:05 AM  Result Value Ref Range   WBC 8.1 4.0 - 10.5 K/uL   RBC 2.57 (L) 4.22 - 5.81 MIL/uL   Hemoglobin 7.8 (L) 13.0 - 17.0 g/dL   HCT 73.7 (L) 60.9 - 47.9 %   MCV 101.9 (H) 80.0 - 100.0 fL   MCH 30.4 26.0 - 34.0 pg   MCHC 29.8 (L) 30.0 - 36.0 g/dL   RDW 83.1 (H) 88.4 - 84.4 %   Platelets 394 150 - 400 K/uL   nRBC 0.2 0.0 - 0.2 %    Comment: Performed at [facility], [address] [place], [place] [address]  Basic metabolic panel with GFR     Status: Abnormal   Collection Time: 06/19/24  6:05 AM  Result Value Ref Range   Sodium 144 135 - 145 mmol/L    Comment: Delta check noted    Potassium 3.6 3.5 - 5.1 mmol/L   Chloride 106 98 - 111 mmol/L   CO2 22 22 - 32 mmol/L   Glucose, Bld 103 (H) 70 - 99 mg/dL    Comment: Glucose reference range applies only to samples taken after fasting for at least 8 hours.   BUN 33 (H) 8 - 23 mg/dL   Creatinine, Ser 7.56 (H) 0.61 - 1.24 mg/dL   Calcium  7.9 (L) 8.9 - 10.3 mg/dL   GFR, Estimated 26 (L) >60 mL/min    Comment: (NOTE) Calculated using the CKD-EPI Creatinine Equation (2021)    Anion gap 16 (H) 5 - 15    Comment: Performed at [facility], [address] [place], [place] [address]  Glucose, capillary     Status: None   Collection Time: 06/19/24  7:16 AM  Result Value Ref Range   Glucose-Capillary 98 70 - 99 mg/dL    Comment: Glucose reference range applies only to samples taken after fasting for at least 8 hours.    Studies/Results: No results found.  Medications: I have reviewed the patient's current medications.  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

## 2024-06-19 NOTE — Plan of Care (Signed)
   Problem: Nutrition: Goal: Adequate nutrition will be maintained Outcome: Progressing

## 2024-06-19 NOTE — Progress Notes (Signed)
 Rectal tube removed without difficulty due to no order, leaking Type 7 stools (Suprep), and excessive stool noted in buttock wound. Rectal pouch placed without difficulty. Charge notified.

## 2024-06-20 ENCOUNTER — Encounter (HOSPITAL_COMMUNITY): Payer: Self-pay | Admitting: Anesthesiology

## 2024-06-20 ENCOUNTER — Inpatient Hospital Stay (HOSPITAL_COMMUNITY): Payer: Self-pay | Admitting: Anesthesiology

## 2024-06-20 ENCOUNTER — Encounter (HOSPITAL_COMMUNITY): Payer: Self-pay | Admitting: Internal Medicine

## 2024-06-20 ENCOUNTER — Encounter (HOSPITAL_COMMUNITY)
Admission: EM | Disposition: A | Discharge status: Skilled Nursing Facility | Payer: Self-pay | Source: Home / Self Care | Attending: Internal Medicine

## 2024-06-20 DIAGNOSIS — I679 Cerebrovascular disease, unspecified: Secondary | ICD-10-CM | POA: Diagnosis not present

## 2024-06-20 DIAGNOSIS — I10 Essential (primary) hypertension: Secondary | ICD-10-CM

## 2024-06-20 DIAGNOSIS — R933 Abnormal findings on diagnostic imaging of other parts of digestive tract: Secondary | ICD-10-CM | POA: Diagnosis not present

## 2024-06-20 DIAGNOSIS — E119 Type 2 diabetes mellitus without complications: Secondary | ICD-10-CM

## 2024-06-20 DIAGNOSIS — D125 Benign neoplasm of sigmoid colon: Secondary | ICD-10-CM

## 2024-06-20 HISTORY — PX: COLONOSCOPY: SHX5424

## 2024-06-20 HISTORY — PX: BONE BIOPSY: SHX375

## 2024-06-20 LAB — GLUCOSE, CAPILLARY
Glucose-Capillary: 78 mg/dL (ref 70–99)
Glucose-Capillary: 46 mg/dL — ABNORMAL LOW (ref 70–99)
Glucose-Capillary: 47 mg/dL — ABNORMAL LOW (ref 70–99)
Glucose-Capillary: 93 mg/dL (ref 70–99)
Glucose-Capillary: 103 mg/dL — ABNORMAL HIGH (ref 70–99)
Glucose-Capillary: 153 mg/dL — ABNORMAL HIGH (ref 70–99)
Glucose-Capillary: 46 mg/dL — ABNORMAL LOW (ref 70–99)

## 2024-06-20 LAB — CBC
HCT: 24.2 % — ABNORMAL LOW (ref 39.0–52.0)
Hemoglobin: 7.4 g/dL — ABNORMAL LOW (ref 13.0–17.0)
MCH: 30.5 pg (ref 26.0–34.0)
MCHC: 30.6 g/dL (ref 30.0–36.0)
MCV: 99.6 fL (ref 80.0–100.0)
Platelets: 462 K/uL — ABNORMAL HIGH (ref 150–400)
RBC: 2.43 MIL/uL — ABNORMAL LOW (ref 4.22–5.81)
RDW: 16.7 % — ABNORMAL HIGH (ref 11.5–15.5)
WBC: 12.5 K/uL — ABNORMAL HIGH (ref 4.0–10.5)
nRBC: 0 % (ref 0.0–0.2)

## 2024-06-20 LAB — ABO/RH: ABO/RH(D): O POS

## 2024-06-20 LAB — PREPARE RBC (CROSSMATCH)

## 2024-06-20 MED ORDER — PHENYLEPHRINE 80 MCG/ML (10ML) SYRINGE FOR IV PUSH (FOR BLOOD PRESSURE SUPPORT)
PREFILLED_SYRINGE | INTRAVENOUS | Status: DC | PRN
  Administered 2024-06-20 (×4): 160 ug via INTRAVENOUS
  Administered 2024-06-20: 200 ug via INTRAVENOUS
  Administered 2024-06-20 (×2): 160 ug via INTRAVENOUS

## 2024-06-20 MED ORDER — PROPOFOL 500 MG/50ML IV EMUL
INTRAVENOUS | Status: AC
  Filled 2024-06-20: qty 50

## 2024-06-20 MED ORDER — ALBUMIN HUMAN 5 % IV SOLN: INTRAVENOUS | Status: DC | PRN

## 2024-06-20 MED ORDER — DEXTROSE 50 % IV SOLN
INTRAVENOUS | Status: AC
  Filled 2024-06-20: qty 50

## 2024-06-20 MED ORDER — ALBUMIN HUMAN 5 % IV SOLN
INTRAVENOUS | Status: AC
  Filled 2024-06-20: qty 250

## 2024-06-20 MED ORDER — PROPOFOL 10 MG/ML IV BOLUS
INTRAVENOUS | Status: DC | PRN
  Administered 2024-06-20: 20 mg via INTRAVENOUS

## 2024-06-20 MED ORDER — SODIUM CHLORIDE 0.9% IV SOLUTION: Freq: Once | INTRAVENOUS | Status: DC

## 2024-06-20 MED ORDER — DEXTROSE 50 % IV SOLN: 25.0000 g | INTRAVENOUS | Status: AC

## 2024-06-20 MED ORDER — ALBUMIN HUMAN 5 % IV SOLN
12.5000 g | Freq: Once | INTRAVENOUS | Status: AC
  Administered 2024-06-20: 12.5 g via INTRAVENOUS

## 2024-06-20 MED ORDER — PROPOFOL 500 MG/50ML IV EMUL
INTRAVENOUS | Status: DC | PRN
  Administered 2024-06-20: 125 ug/kg/min via INTRAVENOUS

## 2024-06-20 MED ORDER — DEXTROSE 50 % IV SOLN
25.0000 mL | Freq: Once | INTRAVENOUS | Status: AC
  Administered 2024-06-20: 25 mL via INTRAVENOUS

## 2024-06-20 NOTE — Progress Notes (Signed)
 Patient returned to unit from PACU following colonoscopy, VS obtained, telemetry monitor applied, call light placed in reach

## 2024-06-20 NOTE — Op Note (Signed)
 [facility] Patient Name: Jake Bartlett Procedure Date: 06/20/2024 MRN: [id number] Attending MD: Estelita Manas , MD, [id number] Date of Birth: Aug 03, 1941 CSN: [id number] Age: 83 Admit Type: Inpatient Procedure:                Colonoscopy Indications:              This is the patient's first colonoscopy, Abnormal                            CT of the GI tract, Cecal mass noted on CT                            suspicious for colon cancer Providers:                Estelita Manas, MD, Collene Edu, RN, Haskel Chris,                            Technician Referring MD:             [facility] Medicines:                Monitored Anesthesia Care Complications:            No immediate complications. Estimated blood loss:                            Minimal. Estimated Blood Loss:     Estimated blood loss was minimal. Procedure:                Pre-Anesthesia Assessment:                           - Prior to the procedure, a History and Physical                            was performed, and patient medications and                            allergies were reviewed. The patient's tolerance of                            previous anesthesia was also reviewed. The risks                            and benefits of the procedure and the sedation                            options and risks were discussed with the patient.                            All questions were answered, and informed consent                            was obtained. Prior Anticoagulants: The patient has  taken Lovenox  (enoxaparin ), last dose was day of                            procedure. ASA Grade Assessment: III - A patient                            with severe systemic disease. After reviewing the                            risks and benefits, the patient was deemed in                            satisfactory condition to undergo the procedure.                           After obtaining  informed consent, the colonoscope                            was passed under direct vision. Throughout the                            procedure, the patient's blood pressure, pulse, and                            oxygen saturations were monitored continuously. The                            [id number]-[id number] ([id number]) Olympus Colonoscope was                            introduced through the anus and advanced to the the                            cecum, identified by its appearance. The                            colonoscopy was performed without difficulty. The                            patient tolerated the procedure well. The quality                            of the bowel preparation was poor. The ileocecal                            valve, appendiceal orifice, and rectum were                            photographed. Scope In: 11:10:10 AM Scope Out: 11:24:19 AM Scope Withdrawal Time: 0 hours 8 minutes 24 seconds  Total Procedure Duration: 0 hours 14 minutes 9 seconds  Findings:      The perianal and digital rectal examinations were normal.      Semi-liquid semi-solid stool was found in the entire colon, interfering  with visualization. Lavage of the area was performed, resulting in       incomplete clearance with continued poor visualization.      A frond-like/villous, fungating, infiltrative, sessile and ulcerated       non-obstructing large mass was found in the cecum. This was biopsied       with a cold forceps for histology.      Three sessile polyps were found in the sigmoid colon, hepatic flexure       and ascending colon. The polyps were 4 to 6 mm in size. Polypectomy was       not attempted.      Multiple large-mouthed and medium-mouthed diverticula were found in the       sigmoid colon, descending colon and transverse colon.      Non-bleeding internal hemorrhoids were found during retroflexion. Impression:               - Preparation of the colon was poor.                            - Stool in the entire examined colon.                           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 Biopsied.                           - Three 4 to 6 mm polyps in the sigmoid colon, at                            the hepatic flexure and in the ascending colon.                            Resection not attempted.                           - Diverticulosis in the sigmoid colon, in the                            descending colon and in the transverse colon.                           - Non-bleeding internal hemorrhoids. Moderate Sedation:      Patient did not receive moderate sedation for this procedure, but       instead received monitored anesthesia care. Recommendation:           - Clear liquid diet.                           - Continue present medications.                           - Await pathology results.                           - Refer to a surgeon at appointment to be scheduled.                           -  Refer to an oncologist at appointment to be                            scheduled. Procedure Code(s):        --- Professional ---                           [phone], Colonoscopy, flexible; with biopsy, single                            or multiple Diagnosis Code(s):        --- Professional ---                           K64.8, Other hemorrhoids                           C18.0, Malignant neoplasm of cecum                           D12.5, Benign neoplasm of sigmoid colon                           D12.3, Benign neoplasm of transverse colon (hepatic                            flexure or splenic flexure)                           D12.2, Benign neoplasm of ascending colon                           K57.30, Diverticulosis of large intestine without                            perforation or abscess without bleeding                           R93.3, Abnormal findings on diagnostic imaging of                            other parts of digestive tract CPT copyright 2022 American Medical  Association. All rights reserved. The codes documented in this report are preliminary and upon coder review may  be revised to meet current compliance requirements. Estelita Manas, MD 06/20/2024 11:35:53 AM This report has been signed electronically. Number of Addenda: 0

## 2024-06-20 NOTE — Progress Notes (Signed)
 Stopped PRBCs ([id number]) once completed at 1719, unit was started in PACU @ 1410 prior to pt transporting back to unit. No green sheet attached to blood, charge RN made aware

## 2024-06-20 NOTE — Progress Notes (Signed)
 Pt's CBG is 46, 1 amp D50W given per protocol. CBG is 153 after 15 mins.

## 2024-06-20 NOTE — Interval H&P Note (Signed)
 History and Physical Interval Note: 83 year old male with cecal mass on CAT scan suspicious for malignancy for colonoscopy with biopsy.   06/20/2024 10:57 AM  Jake Bartlett  has presented today for colonoscopy with biopsy, with the diagnosis of cecal mass on CT.  The various methods of treatment have been discussed with the patient and family. After consideration of risks, benefits and other options for treatment, the patient has consented to  Procedure(s): COLONOSCOPY (N/A) as a surgical intervention.  The patient's history has been reviewed, patient examined, no change in status, stable for surgery.  I have reviewed the patient's chart and labs.  Questions were answered to the patient's satisfaction.     Estelita Manas

## 2024-06-20 NOTE — Progress Notes (Signed)
 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  On arrival to the ER he was hypotensive.  Creatinine was 2.8, lactic acid was 2.7.  UA was abnormal.  Patient was started on antibiotics and admitted to the hospital.  A CT scan abdomen pelvis showed significant increase in the cecal mass consistent with colonic malignancy.  GI was consulted.  Subjective:  Patient seen and examined before going to procedure.  By the time this note is created, patient had undergone colonoscopy and found to have malignant looking cecal lesion. Patient told me in the morning rounds that hopefully we can get biopsy and he has something that can be surgically removed.  GI will consult surgery.  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 Colonoscopy today, villous fungating and infiltrative nonobstructive large mass was found in the cecum.  Biopsied. Surgery consult.  Acute UTI present on admission, severe sepsis suspected present on admission due to hypotension, lactic acidosis. Urine culture with multiple species.  Blood cultures negative.  Previously Klebsiella UTI.  Patient on Rocephin .  With multiple bacteria, will complete 5 days of IV Rocephin .  Right heel wound: Local wound care.  CKD stage IV: Kidney function at baseline.  Monitor.  Acute on chronic normocytic anemia, iron  deficiency: Hemoglobin at baseline 8-10.  Hemoglobin 7.4.  No evidence of overt bleeding.  Given IV iron .  Will monitor and transfuse if less than 7.  Type 2 diabetes,  well-controlled on insulin  at home.  Continue 70/30 insulin .  Essential hypertension: Blood pressure stable.  Meds on hold.  Debility, deconditioning, history of stroke and Parkinson's: Lives at home.  Significantly weak than baseline.  Refer to SNF.  Surgical decision first.     DVT prophylaxis: heparin  injection 5,000 Units Start: 06/19/24 0600 SCDs Start: 06/16/24 1711   Code Status: Full code Family Communication: None at the bedside Disposition Plan: Status is: Inpatient Remains inpatient appropriate because: Inpatient procedures planned     Consultants:  GI  Procedures:  None  Antimicrobials:  Rocephin  8/27---      Objective: Vitals:   06/19/24 1348 06/19/24 2109 06/20/24 0638 06/20/24 1054  BP: (!) 103/56 114/63 133/67 122/65  Pulse: 86 94 80 94  Resp: 14 16 16 20   Temp: 97.8 F (36.6 C) 97.7 F (36.5 C) 98.1 F (36.7 C) (!) 97.3 F (36.3 C)  TempSrc: Oral Oral Oral Temporal  SpO2: 96% 100% 98% 97%  Weight:      Height:        Intake/Output Summary (Last 24 hours) at 06/20/2024 1139 Last data filed at 06/20/2024 1133 Gross per 24 hour  Intake 1719.21 ml  Output 2600 ml  Net -880.79 ml   Filed Weights   06/16/24 1931  Weight: 78.9 kg    Examination:  General exam: Frail and debilitated.  Chronically sick looking.  Interactive.  Alert awake and oriented. Respiratory system: Clear to auscultation. Respiratory effort normal.  No added sounds. Cardiovascular system: S1 & S2 heard, RRR.  Gastrointestinal system: Soft.  Nontender.  Bowel sound present. Central nervous system: Alert and oriented.  Flat affect.  Patient has pressure ulcer right heel.   Data Reviewed: I have personally reviewed following labs and imaging studies  CBC: Recent Labs  Lab 06/16/24 1208 06/17/24 0504 06/18/24 0513 06/19/24 0605 06/20/24 1122  WBC 11.7* 8.8 12.5* 8.1 12.5*  NEUTROABS 8.6*  --   --   --   --   HGB 9.2* 7.5* 7.6* 7.8* 7.4*  HCT 30.6* 24.5* 24.7*  26.2* 24.2*  MCV 100.7* 101.7* 100.8* 101.9* 99.6  PLT 505* 380 395 394 462*   Basic Metabolic Panel: Recent Labs  Lab 06/16/24 1208 06/17/24 0504 06/18/24 0513 06/19/24 0605  NA 137 138 136 144  K 4.7 4.3 4.5 3.6  CL 103 106 104 106  CO2 19* 21* 20* 22  GLUCOSE 150* 109* 111* 103*  BUN 43* 40* 38* 33*  CREATININE 2.85* 2.60* 2.48* 2.43*  CALCIUM  8.3* 7.6* 7.6* 7.9*   GFR: Estimated Creatinine Clearance: 21.9 mL/min (A) (by C-G formula based on SCr of 2.43 mg/dL (H)). Liver Function Tests: Recent Labs  Lab 06/16/24 1208  AST 25  ALT <5  ALKPHOS 158*  BILITOT 0.4  PROT 6.4*  ALBUMIN  2.7*   Recent Labs  Lab 06/16/24 1208  LIPASE 37   No results for input(s): AMMONIA in the last 168 hours. Coagulation Profile: No results for input(s): INR, PROTIME in the last 168 hours. Cardiac Enzymes: No results for input(s): CKTOTAL, CKMB, CKMBINDEX, TROPONINI in the last 168 hours. BNP (last 3 results) No results for input(s): PROBNP in the last 8760 hours. HbA1C: No results for input(s): HGBA1C in the last 72 hours.  CBG: Recent Labs  Lab 06/19/24 0716 06/19/24 1154 06/19/24 1714 06/19/24 2111 06/20/24 0733  GLUCAP 98 105* 96 178* 78   Lipid Profile: No results for input(s): CHOL, HDL, LDLCALC, TRIG, CHOLHDL, LDLDIRECT in the last 72 hours. Thyroid Function Tests: No results for input(s): TSH, T4TOTAL, FREET4, T3FREE, THYROIDAB in the last 72 hours. Anemia Panel: Recent Labs    06/18/24 0513  TIBC 135*  IRON  18*   Sepsis Labs: Recent Labs  Lab 06/16/24 1215 06/16/24 1511  LATICACIDVEN 2.7* 1.4    Recent Results (from the past 240 hours)  Blood culture (routine x 2)     Status: None (Preliminary result)   Collection Time: 06/16/24 12:08 PM   Specimen: BLOOD  Result Value Ref Range Status   Specimen Description   Final    BLOOD SITE NOT SPECIFIED Performed at [facility], [address] [place], [place] [address]    Special Requests   Final    BOTTLES DRAWN AEROBIC AND ANAEROBIC Blood Culture adequate volume Performed at [facility], [address] [place], [place] [address]    Culture   Final    NO GROWTH 4 DAYS Performed at [facility], [address] [place], [place] [address]    Report Status PENDING  Incomplete  Blood culture (routine x 2)     Status: None (Preliminary result)   Collection Time: 06/16/24  3:17 PM   Specimen: BLOOD  Result Value Ref Range Status   Specimen Description   Final    BLOOD SITE NOT SPECIFIED Performed at [facility], [address], [place], [place] [address]    Special Requests   Final    BOTTLES DRAWN AEROBIC AND ANAEROBIC Blood Culture adequate volume Performed at [facility], [address], [place], [place] [address]    Culture   Final  NO GROWTH 4 DAYS Performed at [facility], [address], [place], [place] [address]    Report Status PENDING  Incomplete  Urine Culture     Status: Abnormal   Collection Time: 06/16/24  8:36 PM   Specimen: Urine, Clean Catch  Result Value Ref Range Status   Specimen Description   Final    URINE, CLEAN CATCH Performed at [facility], [address] [place], [place] [address]    Special Requests   Final    NONE Performed at [facility], [address], [place], [place] [address]    Culture MULTIPLE SPECIES PRESENT, SUGGEST RECOLLECTION (A)  Final   Report Status 06/17/2024 FINAL  Final  MRSA Next Gen by PCR, Nasal     Status: None   Collection Time: 06/16/24  8:36 PM   Specimen: Urine, Clean Catch; Nasal Swab  Result Value Ref Range Status   MRSA by PCR Next Gen NOT DETECTED NOT DETECTED Final    Comment: (NOTE) The GeneXpert MRSA Assay (FDA approved for NASAL specimens only), is one component of a comprehensive MRSA colonization surveillance program. It is not  intended to diagnose MRSA infection nor to guide or monitor treatment for MRSA infections. Test performance is not FDA approved in patients less than 74 years old. Performed at [facility], [address] [place], [place] [address]   Urine Culture (for pregnant, neutropenic or urologic patients or patients with an indwelling urinary catheter)     Status: Abnormal   Collection Time: 06/18/24 11:21 AM   Specimen: Urine, Clean Catch  Result Value Ref Range Status   Specimen Description   Final    URINE, CLEAN CATCH Performed at [facility], [address] [place], [place] [address]    Special Requests   Final    NONE Performed at [facility], [address] [place], [place] [address]    Culture MULTIPLE SPECIES PRESENT, SUGGEST RECOLLECTION (A)  Final   Report Status 06/19/2024 FINAL  Final         Radiology Studies: No results found.      Scheduled Meds:  [MAR Hold] carbidopa -levodopa   1 tablet Oral BID   [MAR Hold] cholecalciferol   1,000 Units Oral Daily   [MAR Hold] escitalopram   10 mg Oral Daily   [MAR Hold] folic acid   1 mg Oral Daily   [MAR Hold] Gerhardt's butt cream   Topical TID   [MAR Hold] heparin  injection (subcutaneous)  5,000 Units Subcutaneous Q8H   [MAR Hold] insulin  aspart  0-5 Units Subcutaneous QHS   [MAR Hold] insulin  aspart  0-9 Units Subcutaneous TID WC   [MAR Hold] insulin  aspart protamine- aspart  7 Units Subcutaneous BID WC   [MAR Hold] leptospermum manuka honey  1 Application Topical Daily   [MAR Hold] multivitamin with minerals  1 tablet Oral Daily   [MAR Hold] polyethylene glycol  17 g Oral Daily   [MAR Hold] pramipexole   0.25 mg Oral QHS   [MAR Hold] rosuvastatin   10 mg Oral QHS   [MAR Hold] sodium bicarbonate   650 mg Oral TID   [MAR Hold] tamsulosin   0.4 mg Oral Daily   Continuous Infusions:  sodium chloride      [MAR Hold] cefTRIAXone  (ROCEPHIN )  IV 2 g (06/19/24 1436)      LOS: 4 days    Time spent: 40 minutes    Renato Applebaum, MD [facility]

## 2024-06-20 NOTE — Progress Notes (Signed)
 The patient's wife Tandy called requesting information regarding colonoscopy time today. Obtained patient's verbal consent and spoke with wife at bedside. Informed her that the procedure is scheduled for 11 am. Wife had no further questions.

## 2024-06-20 NOTE — Transfer of Care (Signed)
 Immediate Anesthesia Transfer of Care Note  Patient: Jake Bartlett  Procedure(s) Performed: COLONOSCOPY BIOPSY, GI  Patient Location: PACU  Anesthesia Type:MAC  Level of Consciousness: awake and patient cooperative  Airway & Oxygen Therapy: Patient Spontanous Breathing and Patient connected to nasal cannula oxygen  Post-op Assessment: Report given to RN and Post -op Vital signs reviewed and stable  Post vital signs: Reviewed and stable  Last Vitals:  Vitals Value Taken Time  BP 107/47 06/20/24 11:41  Temp    Pulse 37 06/20/24 11:44  Resp 16 06/20/24 11:44  SpO2 82 % 06/20/24 11:44  Vitals shown include unfiled device data.  Last Pain:  Vitals:   06/20/24 1054  TempSrc: Temporal  PainSc: 0-No pain      Patients Stated Pain Goal: 0 (06/18/24 1537)  Complications: No notable events documented.

## 2024-06-20 NOTE — Anesthesia Procedure Notes (Signed)
 Procedure Name: MAC Date/Time: 06/20/2024 11:01 AM  Performed by: Judythe Tanda Aran, CRNAPre-anesthesia Checklist: Patient identified, Emergency Drugs available, Suction available and Patient being monitored Patient Re-evaluated:Patient Re-evaluated prior to induction Oxygen Delivery Method: Nasal cannula

## 2024-06-20 NOTE — Plan of Care (Signed)
   Problem: Metabolic: Goal: Ability to maintain appropriate glucose levels will improve Outcome: Progressing

## 2024-06-20 NOTE — Anesthesia Postprocedure Evaluation (Addendum)
 Anesthesia Post Note  Patient: Jake Bartlett  Procedure(s) Performed: COLONOSCOPY BIOPSY, GI     Patient location during evaluation: PACU Anesthesia Type: MAC Level of consciousness: awake and alert Pain management: pain level controlled Vital Signs Assessment: post-procedure vital signs reviewed and stable Respiratory status: spontaneous breathing, nonlabored ventilation and respiratory function stable Cardiovascular status: blood pressure returned to baseline Postop Assessment: no apparent nausea or vomiting Anesthetic complications: no Comments: Patient awake but sleepy in PACU. Initial CBG 40s, given juice but repeat not improved so 1amp D50 given IV. Hypotension with BP as low as 91/32. Albumin  given as well as 1u pRBCs with improvement to SBP 120s. Lawence, MD   No notable events documented.  Last Vitals:  Vitals:   06/20/24 1400 06/20/24 1410  BP: (!) 124/55 (!) 120/47  Pulse: 78 75  Resp: 14 14  Temp: 36.7 C 36.7 C  SpO2: 95%     Last Pain:  Vitals:   06/20/24 1410  TempSrc: Oral  PainSc:                  [name]

## 2024-06-21 ENCOUNTER — Inpatient Hospital Stay (HOSPITAL_COMMUNITY)

## 2024-06-21 LAB — CULTURE, BLOOD (ROUTINE X 2)
Culture: NO GROWTH
Culture: NO GROWTH
Special Requests: ADEQUATE
Special Requests: ADEQUATE

## 2024-06-21 LAB — CBC WITH DIFFERENTIAL/PLATELET
Abs Immature Granulocytes: 0.11 K/uL — ABNORMAL HIGH (ref 0.00–0.07)
Basophils Absolute: 0 K/uL (ref 0.0–0.1)
Basophils Relative: 0 %
Eosinophils Absolute: 0.2 K/uL (ref 0.0–0.5)
Eosinophils Relative: 2 %
HCT: 26.4 % — ABNORMAL LOW (ref 39.0–52.0)
Hemoglobin: 8.1 g/dL — ABNORMAL LOW (ref 13.0–17.0)
Immature Granulocytes: 1 %
Lymphocytes Relative: 16 %
Lymphs Abs: 1.3 K/uL (ref 0.7–4.0)
MCH: 29.8 pg (ref 26.0–34.0)
MCHC: 30.7 g/dL (ref 30.0–36.0)
MCV: 97.1 fL (ref 80.0–100.0)
Monocytes Absolute: 0.7 K/uL (ref 0.1–1.0)
Monocytes Relative: 9 %
Neutro Abs: 5.8 K/uL (ref 1.7–7.7)
Neutrophils Relative %: 72 %
Platelets: 415 K/uL — ABNORMAL HIGH (ref 150–400)
RBC: 2.72 MIL/uL — ABNORMAL LOW (ref 4.22–5.81)
RDW: 18.7 % — ABNORMAL HIGH (ref 11.5–15.5)
WBC: 8.1 K/uL (ref 4.0–10.5)
nRBC: 0 % (ref 0.0–0.2)

## 2024-06-21 LAB — GLUCOSE, CAPILLARY
Glucose-Capillary: 116 mg/dL — ABNORMAL HIGH (ref 70–99)
Glucose-Capillary: 145 mg/dL — ABNORMAL HIGH (ref 70–99)
Glucose-Capillary: 149 mg/dL — ABNORMAL HIGH (ref 70–99)
Glucose-Capillary: 196 mg/dL — ABNORMAL HIGH (ref 70–99)
Glucose-Capillary: 82 mg/dL (ref 70–99)
Glucose-Capillary: 92 mg/dL (ref 70–99)

## 2024-06-21 MED ORDER — SODIUM CHLORIDE 0.9 % IV SOLN: INTRAVENOUS | Status: DC

## 2024-06-21 MED ORDER — DEXTROSE-SODIUM CHLORIDE 5-0.45 % IV SOLN: INTRAVENOUS | Status: AC

## 2024-06-21 NOTE — Consult Note (Signed)
 Reason for Consult:cecal mass Referring Physician: Dr Raenelle Lung Jake Bartlett is an 83 y.o. male.  HPI: 86 yom with dm, prior stroke, CKD, parkinsons who has recent right hip fx in June with surgery at OSH.  He came to his follow up and had n/v/d there and noted to by hypotensive on 8/27.  He was then sent to the ER here. Thought to have severe sepsis related to a UTI. He has anemia and his ckd as well.  He had a ct scan that shows a cecal mass measuring 4.3x8 cm in size.  He also has a question of a urothelial mass.  He has undergone colonoscopy that shows a large cecal mass path pending.  There are polyps in the sigmoid colon, hepatic flexure and ascending colon that were not resected.  We were asked to see him.   Past Medical History:  Diagnosis Date   Diabetes mellitus without complication (HCC)    High cholesterol    Stroke [facility])     Past Surgical History:  Procedure Laterality Date   BACK SURGERY     HIP SURGERY     PENILE PROSTHESIS IMPLANT  08/14/2006   AMS Penile Prosthesis; Performed by MYRTIS Stalling MD    History reviewed. No pertinent family history.  Social History:  reports that he has never smoked. He has never used smokeless tobacco. He reports that he does not drink alcohol and does not use drugs.  Allergies:  Allergies  Allergen Reactions   Ropinirole Anaphylaxis, Swelling and Other (See Comments)    Angioedema   Gabapentin Other (See Comments)    Drowsiness- Made him extremely sleepy - even a low dose    Medications: I have reviewed the patient's current medications.  Results for orders placed or performed during the hospital encounter of 06/16/24 (from the past 48 hours)  Glucose, capillary     Status: Abnormal   Collection Time: 06/19/24 11:54 Bartlett  Result Value Ref Range   Glucose-Capillary 105 (H) 70 - 99 mg/dL    Comment: Glucose reference range applies only to samples taken after fasting for at least 8 hours.  Glucose, capillary     Status: None    Collection Time: 06/19/24  5:14 PM  Result Value Ref Range   Glucose-Capillary 96 70 - 99 mg/dL    Comment: Glucose reference range applies only to samples taken after fasting for at least 8 hours.  Glucose, capillary     Status: Abnormal   Collection Time: 06/19/24  9:11 PM  Result Value Ref Range   Glucose-Capillary 178 (H) 70 - 99 mg/dL    Comment: Glucose reference range applies only to samples taken after fasting for at least 8 hours.  Glucose, capillary     Status: None   Collection Time: 06/20/24  7:33 Bartlett  Result Value Ref Range   Glucose-Capillary 78 70 - 99 mg/dL    Comment: Glucose reference range applies only to samples taken after fasting for at least 8 hours.  Type and screen [facility]     Status: None (Preliminary result)   Collection Time: 06/20/24 11:15 Bartlett  Result Value Ref Range   ABO/RH(D) O POS    Antibody Screen NEG    Sample Expiration 06/23/2024,2359    Unit Number [id number]    Blood Component Type RED CELLS,LR    Unit division 00    Status of Unit ISSUED    Transfusion Status OK TO TRANSFUSE    Crossmatch Result  Compatible Performed at [facility], [address] [place], [place] [address]   CBC     Status: Abnormal   Collection Time: 06/20/24 11:22 Bartlett  Result Value Ref Range   WBC 12.5 (H) 4.0 - 10.5 K/uL   RBC 2.43 (L) 4.22 - 5.81 MIL/uL   Hemoglobin 7.4 (L) 13.0 - 17.0 g/dL   HCT 75.7 (L) 60.9 - 47.9 %   MCV 99.6 80.0 - 100.0 fL   MCH 30.5 26.0 - 34.0 pg   MCHC 30.6 30.0 - 36.0 g/dL   RDW 83.2 (H) 88.4 - 84.4 %   Platelets 462 (H) 150 - 400 K/uL   nRBC 0.0 0.0 - 0.2 %    Comment: Performed at [facility], [address], [place], [place] [address]  Glucose, capillary     Status: Abnormal   Collection Time: 06/20/24 11:45 Bartlett  Result Value Ref Range   Glucose-Capillary 47 (L) 70 - 99 mg/dL    Comment: Glucose reference range applies only to samples taken after fasting for at  least 8 hours.  Glucose, capillary     Status: Abnormal   Collection Time: 06/20/24 12:05 PM  Result Value Ref Range   Glucose-Capillary 46 (L) 70 - 99 mg/dL    Comment: Glucose reference range applies only to samples taken after fasting for at least 8 hours.  Glucose, capillary     Status: None   Collection Time: 06/20/24  1:13 PM  Result Value Ref Range   Glucose-Capillary 93 70 - 99 mg/dL    Comment: Glucose reference range applies only to samples taken after fasting for at least 8 hours.  ABO/Rh     Status: None   Collection Time: 06/20/24  1:39 PM  Result Value Ref Range   ABO/RH(D)      O POS Performed at [facility], [address], [place], [place] [address]   Prepare RBC (crossmatch)     Status: None   Collection Time: 06/20/24  1:39 PM  Result Value Ref Range   Order Confirmation      ORDER PROCESSED BY BLOOD BANK Performed at [facility], [address] [place], [place] [address]   Glucose, capillary     Status: Abnormal   Collection Time: 06/20/24  3:10 PM  Result Value Ref Range   Glucose-Capillary 103 (H) 70 - 99 mg/dL    Comment: Glucose reference range applies only to samples taken after fasting for at least 8 hours.  Glucose, capillary     Status: Abnormal   Collection Time: 06/20/24  8:38 PM  Result Value Ref Range   Glucose-Capillary 46 (L) 70 - 99 mg/dL    Comment: Glucose reference range applies only to samples taken after fasting for at least 8 hours.  Glucose, capillary     Status: Abnormal   Collection Time: 06/20/24  8:59 PM  Result Value Ref Range   Glucose-Capillary 153 (H) 70 - 99 mg/dL    Comment: Glucose reference range applies only to samples taken after fasting for at least 8 hours.  Glucose, capillary     Status: None   Collection Time: 06/21/24  1:05 Bartlett  Result Value Ref Range   Glucose-Capillary 82 70 - 99 mg/dL    Comment: Glucose reference range applies only to samples taken after fasting for at  least 8 hours.  Glucose, capillary     Status: None   Collection Time: 06/21/24  7:32 Bartlett  Result Value Ref Range   Glucose-Capillary 92 70 - 99 mg/dL    Comment: Glucose reference range applies only to samples taken after fasting for at least 8 hours.    No results found.  Review of Systems  All other systems reviewed and are negative.  Blood pressure 130/60, pulse 65, temperature 98 F (36.7 C), temperature source Oral, resp. rate 16, height 5' 8 (1.727 m), weight 78.9 kg, SpO2 97%. Physical Exam Vitals reviewed.  Constitutional:      Appearance: Normal appearance.  HENT:     Head: Normocephalic and atraumatic.     Mouth/Throat:     Mouth: Mucous membranes are moist.  Eyes:     General: No scleral icterus. Cardiovascular:     Rate and Rhythm: Normal rate and regular rhythm.  Pulmonary:     Effort: Pulmonary effort is normal.  Abdominal:     General: There is no distension.     Palpations: Abdomen is soft.     Tenderness: There is no abdominal tenderness.  Musculoskeletal:     Cervical back: Neck supple.  Skin:    General: Skin is warm and dry.  Neurological:     General: No focal deficit present.     Mental Status: He is alert.  Psychiatric:        Mood and Affect: Mood normal.        Behavior: Behavior normal.     Assessment/Plan: Cecal mass -don't think need to wait for pathology. Will send CEA.  Can do a chest ct for completion sake as well per medicine -discussed a right colectomy possibly as early as tomorrow but will reevaluate in Bartlett -TRH to discuss urothelial thickening with urology so if want to do cysto at same time would be easier on patient  Discussed with medicine, reviewed labs, vitals and ct with mass  Jake Bartlett Jake Bartlett, Jake Bartlett

## 2024-06-21 NOTE — TOC Progression Note (Signed)
 Transition of Care [facility]) - Progression Note   Patient Details  Name: Jake Bartlett MRN: [id number] Date of Birth: 1941/08/14  Transition of Care [facility] CM/SW Contact  Duwaine GORMAN Aran, LCSW Phone Number: 06/21/2024, 1:10 PM  Clinical Narrative: CSW spoke with patient's wife, Gerry Blanchfield, regarding SNF bed choice. Wife reported they decided on [facility] as patient has been there before, but she wanted to wait until after the patient's surgery to see if he would do well enough to go home with [facility]. Patient's surgery is scheduled for tomorrow. Care management to follow.  Expected Discharge Plan: Skilled Nursing Facility Barriers to Discharge: Continued Medical Work up  Expected Discharge Plan and Services Post Acute Care Choice: Skilled Nursing Facility Living arrangements for the past 2 months: Single Family Home  Social Drivers of Health (SDOH) Interventions SDOH Screenings   Food Insecurity: No Food Insecurity (06/16/2024)  Housing: Low Risk  (06/16/2024)  Transportation Needs: No Transportation Needs (06/16/2024)  Utilities: Not At Risk (06/16/2024)  Financial Resource Strain: Low Risk  (12/05/2023)   Received from [facility]  Physical Activity: Insufficiently Active (06/24/2023)   Received from [facility]  Social Connections: Moderately Integrated (06/16/2024)  Stress: No Stress Concern Present (06/24/2023)   Received from [facility]  Tobacco Use: Low Risk  (06/20/2024)   Readmission Risk Interventions    06/19/2024    4:28 PM  Readmission Risk Prevention Plan  Post Dischage Appt Complete  Medication Screening Complete  Transportation Screening Complete

## 2024-06-21 NOTE — Progress Notes (Signed)
 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  On arrival to the ER he was hypotensive.  Creatinine was 2.8, lactic acid was 2.7.  UA was abnormal.  Patient was started on antibiotics and admitted to the hospital.  A CT scan abdomen pelvis showed significant increase in the cecal mass consistent with colonic malignancy.  GI was consulted.  Subjective: Patient seen and examined.  Patient tells me he is tired but denies any other complaints.  Overnight events noted.  Hypoglycemic due to prolonged n.p.o., unable to eat and liquid diet. Case discussed with surgery.  Patient tells me that he has discussed with surgery to go for surgery tomorrow. CEA pending.  CT chest ordered to look for malignancy.  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 Colonoscopy  villous fungating and infiltrative nonobstructive large mass was found in the cecum.  Biopsied. Surgery consulted. Abnormal bladder on CT scan.  Will discuss with urology whether they can do cystoscopy when patient is in the operating room.  Acute UTI present on admission, severe sepsis suspected present on admission due to hypotension, lactic acidosis. Urine culture with multiple species.  Blood cultures negative.  Previously Klebsiella UTI.  Patient on Rocephin .  With multiple bacteria, will complete 5 days of IV Rocephin .  Right heel wound: Local wound care.  CKD stage IV: Kidney function at baseline.  Monitor.  Acute on chronic normocytic anemia, iron  deficiency:  Hemoglobin at baseline 8-10.  Hemoglobin 8 today.  No evidence of overt bleeding.  Given IV iron .  Will monitor and transfuse if less than 7.  Type 2 diabetes, well-controlled on insulin  at home.  Hypoglycemic due to inadequate oral intake.  Will keep on 5% dextrose  today.  Monitor closely.  Holding all long-acting insulin .  Essential hypertension: Blood pressure stable.  Meds on hold.  Debility, deconditioning, history of stroke and Parkinson's: Lives at home.  Significantly weak than baseline.  Will need rehab after surgery.  Case discussed with urology Dr. Sherrilee who suggested that abnormal CT scan findings on the bladder is likely related to scar from previous prosthesis and not an infiltrative malignancy.  He will follow-up outpatient.     DVT prophylaxis: heparin  injection 5,000 Units Start: 06/19/24 0600 SCDs Start: 06/16/24 1711   Code Status: Full code Family Communication: None at the bedside Disposition Plan: Status is: Inpatient Remains inpatient appropriate because: Inpatient procedures planned     Consultants:  GI Surgery  Procedures:  None  Antimicrobials:  Rocephin  8/27---      Objective: Vitals:   06/20/24 1719 06/20/24 2037 06/21/24 0612 06/21/24 0931  BP: (!) 121/45 (!) 126/57 130/60 (!) 121/50  Pulse: 66 (!) 58 65 61  Resp: 17 16 16    Temp: 98.1 F (36.7 C) 98 F (36.7 C) 98 F (36.7 C)   TempSrc: Oral Oral Oral   SpO2: 100% 99% 97% 99%  Weight:      Height:        Intake/Output Summary (Last 24 hours) at 06/21/2024 1156 Last data filed at 06/21/2024 0900 Gross per 24 hour  Intake 1326 ml  Output 1400 ml  Net -74 ml   Filed Weights   06/16/24 1931  Weight: 78.9 kg    Examination:  General exam: Frail and debilitated.  Chronically sick looking.  Mostly sleepy.  Flat affect. Respiratory system: Clear to auscultation. Respiratory effort normal.  No added sounds. Cardiovascular system: S1 & S2 heard, RRR.  Gastrointestinal system:  Soft.  Nontender.  Bowel sound present. Central nervous system: Alert and oriented.  Flat affect. Patient has pressure ulcer right heel.   Data Reviewed: I have personally reviewed following labs and imaging studies  CBC: Recent Labs  Lab 06/16/24 1208 06/17/24 0504 06/18/24 0513 06/19/24 0605 06/20/24 1122 06/21/24 0950  WBC 11.7* 8.8 12.5* 8.1 12.5* 8.1  NEUTROABS 8.6*  --   --   --   --  5.8  HGB 9.2* 7.5* 7.6* 7.8* 7.4* 8.1*  HCT 30.6* 24.5* 24.7* 26.2* 24.2* 26.4*  MCV 100.7* 101.7* 100.8* 101.9* 99.6 97.1  PLT 505* 380 395 394 462* 415*   Basic Metabolic Panel: Recent Labs  Lab 06/16/24 1208 06/17/24 0504 06/18/24 0513 06/19/24 0605  NA 137 138 136 144  K 4.7 4.3 4.5 3.6  CL 103 106 104 106  CO2 19* 21* 20* 22  GLUCOSE 150* 109* 111* 103*  BUN 43* 40* 38* 33*  CREATININE 2.85* 2.60* 2.48* 2.43*  CALCIUM  8.3* 7.6* 7.6* 7.9*   GFR: Estimated Creatinine Clearance: 21.9 mL/min (A) (by C-G formula based on SCr of 2.43 mg/dL (H)). Liver Function Tests: Recent Labs  Lab 06/16/24 1208  AST 25  ALT <5  ALKPHOS 158*  BILITOT 0.4  PROT 6.4*  ALBUMIN  2.7*   Recent Labs  Lab 06/16/24 1208  LIPASE 37   No results for input(s): AMMONIA in the last 168 hours. Coagulation Profile: No results for input(s): INR, PROTIME in the last 168 hours. Cardiac Enzymes: No results for input(s): CKTOTAL, CKMB, CKMBINDEX, TROPONINI in the last 168 hours. BNP (last 3 results) No results for input(s): PROBNP in the last 8760 hours. HbA1C: No results for input(s): HGBA1C in the last 72 hours.  CBG: Recent Labs  Lab 06/20/24 2038 06/20/24 2059 06/21/24 0105 06/21/24 0732 06/21/24 1132  GLUCAP 46* 153* 82 92 116*   Lipid Profile: No results for input(s): CHOL, HDL, LDLCALC, TRIG, CHOLHDL, LDLDIRECT in the last 72 hours. Thyroid Function Tests: No results for input(s): TSH, T4TOTAL, FREET4, T3FREE, THYROIDAB in the last 72  hours. Anemia Panel: No results for input(s): VITAMINB12, FOLATE, FERRITIN, TIBC, IRON , RETICCTPCT in the last 72 hours.  Sepsis Labs: Recent Labs  Lab 06/16/24 1215 06/16/24 1511  LATICACIDVEN 2.7* 1.4    Recent Results (from the past 240 hours)  Blood culture (routine x 2)     Status: None   Collection Time: 06/16/24 12:08 PM   Specimen: BLOOD  Result Value Ref Range Status   Specimen Description   Final    BLOOD SITE NOT SPECIFIED Performed at [facility], [address] [place], [place] [address]    Special Requests   Final    BOTTLES DRAWN AEROBIC AND ANAEROBIC Blood Culture adequate volume Performed at [facility], [address], [place], [place] [address]    Culture   Final    NO GROWTH 5 DAYS Performed at [facility], [address] [place], [place] [address]    Report Status 06/21/2024 FINAL  Final  Blood culture (routine x 2)     Status: None   Collection  Time: 06/16/24  3:17 PM   Specimen: BLOOD  Result Value Ref Range Status   Specimen Description   Final    BLOOD SITE NOT SPECIFIED Performed at [facility], [address] [place], [place] [address]    Special Requests   Final    BOTTLES DRAWN AEROBIC AND ANAEROBIC Blood Culture adequate volume Performed at [facility], [address], [place], [place] [address]    Culture   Final    NO GROWTH 5 DAYS Performed at [facility], [address], [place], [place] [address]    Report Status 06/21/2024 FINAL  Final  Urine Culture     Status: Abnormal   Collection Time: 06/16/24  8:36 PM   Specimen: Urine, Clean Catch  Result Value Ref Range Status   Specimen Description   Final    URINE, CLEAN CATCH Performed at [facility], [address], [place], [place] [address]    Special Requests   Final    NONE Performed at [name], [address],  [place], [place] [address]    Culture MULTIPLE SPECIES PRESENT, SUGGEST RECOLLECTION (A)  Final   Report Status 06/17/2024 FINAL  Final  MRSA Next Gen by PCR, Nasal     Status: None   Collection Time: 06/16/24  8:36 PM   Specimen: Urine, Clean Catch; Nasal Swab  Result Value Ref Range Status   MRSA by PCR Next Gen NOT DETECTED NOT DETECTED Final    Comment: (NOTE) The GeneXpert MRSA Assay (FDA approved for NASAL specimens only), is one component of a comprehensive MRSA colonization surveillance program. It is not intended to diagnose MRSA infection nor to guide or monitor treatment for MRSA infections. Test performance is not FDA approved in patients less than 33 years old. Performed at [facility], [address] [place], [place] [address]   Urine Culture (for pregnant, neutropenic or urologic patients or patients with an indwelling urinary catheter)     Status: Abnormal   Collection Time: 06/18/24 11:21 AM   Specimen: Urine, Clean Catch  Result Value Ref Range Status   Specimen Description   Final    URINE, CLEAN CATCH Performed at [facility], [address], [place], [place] [address]    Special Requests   Final    NONE Performed at [facility], [address] [place], [place] [address]    Culture MULTIPLE SPECIES PRESENT, SUGGEST RECOLLECTION (A)  Final   Report Status 06/19/2024 FINAL  Final         Radiology Studies: No results found.      Scheduled Meds:  carbidopa -levodopa   1 tablet Oral BID   cholecalciferol   1,000 Units Oral Daily   escitalopram   10 mg Oral Daily   folic acid   1 mg Oral Daily   Gerhardt's butt cream   Topical TID   heparin  injection (subcutaneous)  5,000 Units Subcutaneous Q8H   leptospermum manuka honey  1 Application Topical Daily   multivitamin with minerals  1 tablet Oral Daily   polyethylene glycol  17 g Oral Daily   pramipexole   0.25 mg Oral QHS   rosuvastatin   10 mg Oral  QHS   sodium bicarbonate   650 mg Oral TID   tamsulosin   0.4 mg Oral Daily   Continuous Infusions:  cefTRIAXone  (ROCEPHIN )  IV 2 g (06/19/24 1436)   dextrose  5 % and 0.45 % NaCl  LOS: 5 days    Time spent: 40 minutes    Renato Applebaum, MD [facility]

## 2024-06-21 NOTE — Progress Notes (Signed)
 Subjective: Denies abdominal pain.  Objective: Vital signs in last 24 hours: Temp:  [97.7 F (36.5 C)-98.3 F (36.8 C)] 98 F (36.7 C) (09/01 0612) Pulse Rate:  [58-81] 61 (09/01 0931) Resp:  [14-23] 16 (09/01 0612) BP: (83-130)/(32-60) 121/50 (09/01 0931) SpO2:  [95 %-100 %] 99 % (09/01 0931) Weight change:  Last BM Date : 06/20/24  PE: Elderly, not in distress GENERAL: Prominent pallor  ABDOMEN: Soft, nondistended nontender EXTREMITIES: Pressure boots on bilateral lower extremities  Lab Results: Results for orders placed or performed during the hospital encounter of 06/16/24 (from the past 48 hours)  Glucose, capillary     Status: Abnormal   Collection Time: 06/19/24 11:54 AM  Result Value Ref Range   Glucose-Capillary 105 (H) 70 - 99 mg/dL    Comment: Glucose reference range applies only to samples taken after fasting for at least 8 hours.  Glucose, capillary     Status: None   Collection Time: 06/19/24  5:14 PM  Result Value Ref Range   Glucose-Capillary 96 70 - 99 mg/dL    Comment: Glucose reference range applies only to samples taken after fasting for at least 8 hours.  Glucose, capillary     Status: Abnormal   Collection Time: 06/19/24  9:11 PM  Result Value Ref Range   Glucose-Capillary 178 (H) 70 - 99 mg/dL    Comment: Glucose reference range applies only to samples taken after fasting for at least 8 hours.  Glucose, capillary     Status: None   Collection Time: 06/20/24  7:33 AM  Result Value Ref Range   Glucose-Capillary 78 70 - 99 mg/dL    Comment: Glucose reference range applies only to samples taken after fasting for at least 8 hours.  Type and screen [facility]     Status: None (Preliminary result)   Collection Time: 06/20/24 11:15 AM  Result Value Ref Range   ABO/RH(D) O POS    Antibody Screen NEG    Sample Expiration 06/23/2024,2359    Unit Number [id number]    Blood Component Type RED CELLS,LR    Unit division 00    Status  of Unit ISSUED    Transfusion Status OK TO TRANSFUSE    Crossmatch Result      Compatible Performed at [facility], [address], [place], [place] [address]   CBC     Status: Abnormal   Collection Time: 06/20/24 11:22 AM  Result Value Ref Range   WBC 12.5 (H) 4.0 - 10.5 K/uL   RBC 2.43 (L) 4.22 - 5.81 MIL/uL   Hemoglobin 7.4 (L) 13.0 - 17.0 g/dL   HCT 75.7 (L) 60.9 - 47.9 %   MCV 99.6 80.0 - 100.0 fL   MCH 30.5 26.0 - 34.0 pg   MCHC 30.6 30.0 - 36.0 g/dL   RDW 83.2 (H) 88.4 - 84.4 %   Platelets 462 (H) 150 - 400 K/uL   nRBC 0.0 0.0 - 0.2 %    Comment: Performed at [facility], [address] [place], [place] [address]  Glucose, capillary     Status: Abnormal   Collection Time: 06/20/24 11:45 AM  Result Value Ref Range   Glucose-Capillary 47 (L) 70 - 99 mg/dL    Comment: Glucose reference range applies only to samples taken after fasting for at least 8 hours.  Glucose, capillary     Status: Abnormal   Collection Time: 06/20/24 12:05 PM  Result Value Ref Range  Glucose-Capillary 46 (L) 70 - 99 mg/dL    Comment: Glucose reference range applies only to samples taken after fasting for at least 8 hours.  Glucose, capillary     Status: None   Collection Time: 06/20/24  1:13 PM  Result Value Ref Range   Glucose-Capillary 93 70 - 99 mg/dL    Comment: Glucose reference range applies only to samples taken after fasting for at least 8 hours.  ABO/Rh     Status: None   Collection Time: 06/20/24  1:39 PM  Result Value Ref Range   ABO/RH(D)      O POS Performed at [facility], [address], [place], [place] [address]   Prepare RBC (crossmatch)     Status: None   Collection Time: 06/20/24  1:39 PM  Result Value Ref Range   Order Confirmation      ORDER PROCESSED BY BLOOD BANK Performed at [facility], [address], [place], [place] [address]   Glucose, capillary     Status: Abnormal   Collection  Time: 06/20/24  3:10 PM  Result Value Ref Range   Glucose-Capillary 103 (H) 70 - 99 mg/dL    Comment: Glucose reference range applies only to samples taken after fasting for at least 8 hours.  Glucose, capillary     Status: Abnormal   Collection Time: 06/20/24  8:38 PM  Result Value Ref Range   Glucose-Capillary 46 (L) 70 - 99 mg/dL    Comment: Glucose reference range applies only to samples taken after fasting for at least 8 hours.  Glucose, capillary     Status: Abnormal   Collection Time: 06/20/24  8:59 PM  Result Value Ref Range   Glucose-Capillary 153 (H) 70 - 99 mg/dL    Comment: Glucose reference range applies only to samples taken after fasting for at least 8 hours.  Glucose, capillary     Status: None   Collection Time: 06/21/24  1:05 AM  Result Value Ref Range   Glucose-Capillary 82 70 - 99 mg/dL    Comment: Glucose reference range applies only to samples taken after fasting for at least 8 hours.  Glucose, capillary     Status: None   Collection Time: 06/21/24  7:32 AM  Result Value Ref Range   Glucose-Capillary 92 70 - 99 mg/dL    Comment: Glucose reference range applies only to samples taken after fasting for at least 8 hours.  CBC with Differential/Platelet     Status: Abnormal   Collection Time: 06/21/24  9:50 AM  Result Value Ref Range   WBC 8.1 4.0 - 10.5 K/uL   RBC 2.72 (L) 4.22 - 5.81 MIL/uL   Hemoglobin 8.1 (L) 13.0 - 17.0 g/dL   HCT 73.5 (L) 60.9 - 47.9 %   MCV 97.1 80.0 - 100.0 fL   MCH 29.8 26.0 - 34.0 pg   MCHC 30.7 30.0 - 36.0 g/dL   RDW 81.2 (H) 88.4 - 84.4 %   Platelets 415 (H) 150 - 400 K/uL   nRBC 0.0 0.0 - 0.2 %   Neutrophils Relative % 72 %   Neutro Abs 5.8 1.7 - 7.7 K/uL   Lymphocytes Relative 16 %   Lymphs Abs 1.3 0.7 - 4.0 K/uL   Monocytes Relative 9 %   Monocytes Absolute 0.7 0.1 - 1.0 K/uL   Eosinophils Relative 2 %   Eosinophils Absolute 0.2 0.0 - 0.5 K/uL   Basophils Relative 0 %   Basophils Absolute 0.0  0.0 - 0.1 K/uL   Immature  Granulocytes 1 %   Abs Immature Granulocytes 0.11 (H) 0.00 - 0.07 K/uL    Comment: Performed at [facility], [address], [place], [place] [address]    Studies/Results: No results found.  Medications: I have reviewed the patient's current medications.  Assessment: Cecal mass highly suspicious for malignancy, status post biopsy during colonoscopy yesterday  Few subcentimeter sized polyps noted in the colon, not removed, patient had poor prep despite aggressive 2-day prep  Normocytic anemia, hemoglobin 8.1 Renal impairment, BUN 33, creatinine 2.43, GFR 26  Slightly elevated alk phos 158?  Related to pressure sores Low albumin  2.7, low total protein 6.4  Plan: Was evaluated by surgical team today, who are recommending CT chest for completion of workup to rule out metastases and possibly plan for right colectomy as early as tomorrow.  Please recall GI if needed.  Estelita Manas, MD 06/21/2024, 11:13 AM

## 2024-06-21 NOTE — Plan of Care (Signed)
 Pt's heel wound and sacral area are improving.

## 2024-06-21 NOTE — Progress Notes (Signed)
 Physical Therapy Treatment Patient Details Name: Jake Bartlett MRN: [id number] DOB: 05-Sep-1941 Today's Date: 06/21/2024   History of Present Illness 83 year old male was sent from PCP office due to concern for infection and weakness and admitted for Severe sepsis secondary to UTI. 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 Pt refused mobility 2* feeling too tired. Benefits of mobility and risks of bedrest were explained.  Pt agreed to bed level exercises only. He reports he's likely having colectomy surgery tomorrow.     If plan is discharge home, recommend the following: A little help with bathing/dressing/bathroom;Assistance with cooking/housework;Help with stairs or ramp for entrance;Assist for transportation;A lot of help with walking and/or transfers   Can travel by private vehicle     No  Equipment Recommendations  None recommended by PT    Recommendations for Other Services       Precautions / Restrictions Precautions Precautions: Fall Recall of Precautions/Restrictions: Intact Restrictions Weight Bearing Restrictions Per Provider Order: No     Mobility  Bed Mobility               General bed mobility comments: pt refused getting OOB, stated he's too fatigued, he agreed to bed level exercises    Transfers                        Ambulation/Gait                   Stairs             Wheelchair Mobility     Tilt Bed    Modified Rankin (Stroke Patients Only)       Balance                                             Communication Communication Communication: Impaired Factors Affecting Communication: Hearing impaired  Cognition Arousal: Lethargic Behavior During Therapy: WFL for tasks assessed/performed   PT - Cognitive impairments: No apparent impairments                         Following commands: Intact      Cueing Cueing Techniques: Verbal cues  Exercises General Exercises - Lower Extremity Ankle Circles/Pumps: AROM, Both, 10 reps, Supine Short Arc Quad: AROM, Right, 10 reps, Supine Heel Slides: AAROM, Both, 10 reps, Supine Hip ABduction/ADduction: AAROM, Both, 10 reps, Supine Shoulder Exercises Shoulder Flexion: AROM, Both, 10 reps, Seated    General Comments        Pertinent Vitals/Pain Pain Assessment Pain Assessment: Faces Faces Pain Scale: Hurts little more Pain Location: R hip and L knee with movement Pain Descriptors / Indicators: Grimacing Pain Intervention(s): Limited activity within patient's tolerance, Monitored during session, Repositioned    Home Living                          Prior Function  PT Goals (current goals can now be found in the care plan section) Acute Rehab PT Goals PT Goal Formulation: With patient Time For Goal Achievement: 07/01/24 Potential to Achieve Goals: Good Progress towards PT goals: Not progressing toward goals - comment    Frequency    Min 3X/week      PT Plan      Co-evaluation              AM-PAC PT 6 Clicks Mobility   Outcome Measure  Help needed turning from your back to your side while in a flat bed without using bedrails?: A Little Help needed moving from lying on your back to sitting on the side of a flat bed without using bedrails?: A Lot Help needed moving to and from a bed to a chair (including a wheelchair)?: A Lot Help needed standing up from a chair using your arms (e.g., wheelchair or bedside chair)?: A Lot Help needed to walk in hospital room?: A  Lot Help needed climbing 3-5 steps with a railing? : Total 6 Click Score: 12    End of Session   Activity Tolerance: Patient limited by fatigue Patient left: in bed;with bed alarm set;with call bell/phone within reach Nurse Communication: Mobility status PT Visit Diagnosis: Difficulty in walking, not elsewhere classified (R26.2);Muscle weakness (generalized) (M62.81);Pain     Time: 8898-8887 PT Time Calculation (min) (ACUTE ONLY): 11 min  Charges:    $Therapeutic Exercise: 8-22 mins PT General Charges $$ ACUTE PT VISIT: 1 Visit                     Sylvan Delon Copp PT 06/21/2024  Acute Rehabilitation Services  Office [phone]

## 2024-06-22 ENCOUNTER — Inpatient Hospital Stay (HOSPITAL_COMMUNITY)

## 2024-06-22 ENCOUNTER — Encounter (HOSPITAL_COMMUNITY)
Admission: EM | Disposition: A | Discharge status: Skilled Nursing Facility | Payer: Self-pay | Source: Home / Self Care | Attending: Internal Medicine

## 2024-06-22 ENCOUNTER — Encounter (HOSPITAL_COMMUNITY): Payer: Self-pay | Admitting: Internal Medicine

## 2024-06-22 DIAGNOSIS — K6389 Other specified diseases of intestine: Secondary | ICD-10-CM | POA: Diagnosis not present

## 2024-06-22 DIAGNOSIS — I679 Cerebrovascular disease, unspecified: Secondary | ICD-10-CM

## 2024-06-22 DIAGNOSIS — E119 Type 2 diabetes mellitus without complications: Secondary | ICD-10-CM | POA: Diagnosis not present

## 2024-06-22 HISTORY — PX: LAPAROSCOPIC PARTIAL COLECTOMY: SHX5907

## 2024-06-22 LAB — GLUCOSE, CAPILLARY
Glucose-Capillary: 145 mg/dL — ABNORMAL HIGH (ref 70–99)
Glucose-Capillary: 152 mg/dL — ABNORMAL HIGH (ref 70–99)
Glucose-Capillary: 158 mg/dL — ABNORMAL HIGH (ref 70–99)
Glucose-Capillary: 158 mg/dL — ABNORMAL HIGH (ref 70–99)
Glucose-Capillary: 159 mg/dL — ABNORMAL HIGH (ref 70–99)
Glucose-Capillary: 182 mg/dL — ABNORMAL HIGH (ref 70–99)
Glucose-Capillary: 192 mg/dL — ABNORMAL HIGH (ref 70–99)

## 2024-06-22 LAB — BPAM RBC
Blood Product Expiration Date: 202509292359
ISSUE DATE / TIME: 202508311358
Unit Type and Rh: 5100

## 2024-06-22 LAB — TYPE AND SCREEN
ABO/RH(D): O POS
Antibody Screen: NEGATIVE
Unit division: 0

## 2024-06-22 MED ORDER — ROCURONIUM BROMIDE 10 MG/ML (PF) SYRINGE
PREFILLED_SYRINGE | INTRAVENOUS | Status: AC
  Filled 2024-06-22: qty 10

## 2024-06-22 MED ORDER — BUPIVACAINE-EPINEPHRINE (PF) 0.25% -1:200000 IJ SOLN
INTRAMUSCULAR | Status: AC
  Filled 2024-06-22: qty 30

## 2024-06-22 MED ORDER — ONDANSETRON HCL 4 MG/2ML IJ SOLN
INTRAMUSCULAR | Status: AC
  Filled 2024-06-22: qty 2

## 2024-06-22 MED ORDER — DEXAMETHASONE SODIUM PHOSPHATE 10 MG/ML IJ SOLN
INTRAMUSCULAR | Status: DC | PRN
  Administered 2024-06-22: 5 mg via INTRAVENOUS

## 2024-06-22 MED ORDER — DEXAMETHASONE SODIUM PHOSPHATE 10 MG/ML IJ SOLN
INTRAMUSCULAR | Status: AC
  Filled 2024-06-22: qty 1

## 2024-06-22 MED ORDER — ALBUMIN HUMAN 5 % IV SOLN
INTRAVENOUS | Status: AC
  Filled 2024-06-22: qty 250

## 2024-06-22 MED ORDER — PHENYLEPHRINE HCL-NACL 20-0.9 MG/250ML-% IV SOLN
INTRAVENOUS | Status: DC | PRN
  Administered 2024-06-22: 100 ug via INTRAVENOUS
  Administered 2024-06-22: 25 ug/min via INTRAVENOUS
  Administered 2024-06-22: 100 ug via INTRAVENOUS
  Administered 2024-06-22: 80 ug via INTRAVENOUS
  Administered 2024-06-22: 100 ug via INTRAVENOUS

## 2024-06-22 MED ORDER — METHOCARBAMOL 500 MG PO TABS
500.0000 mg | ORAL_TABLET | Freq: Three times a day (TID) | ORAL | Status: DC
  Administered 2024-06-22 – 2024-06-25 (×8): 500 mg via ORAL
  Filled 2024-06-22 (×8): qty 1

## 2024-06-22 MED ORDER — MORPHINE SULFATE (PF) 2 MG/ML IV SOLN
1.0000 mg | INTRAVENOUS | Status: DC | PRN
  Administered 2024-06-22 (×2): 2 mg via INTRAVENOUS
  Filled 2024-06-22 (×2): qty 1

## 2024-06-22 MED ORDER — ALBUMIN HUMAN 5 % IV SOLN: INTRAVENOUS | Status: DC | PRN

## 2024-06-22 MED ORDER — ONDANSETRON HCL 4 MG/2ML IJ SOLN
INTRAMUSCULAR | Status: DC | PRN
  Administered 2024-06-22: 4 mg via INTRAVENOUS

## 2024-06-22 MED ORDER — PHENYLEPHRINE 80 MCG/ML (10ML) SYRINGE FOR IV PUSH (FOR BLOOD PRESSURE SUPPORT)
PREFILLED_SYRINGE | INTRAVENOUS | Status: DC | PRN
  Administered 2024-06-22: 80 ug via INTRAVENOUS
  Administered 2024-06-22: 100 ug via INTRAVENOUS

## 2024-06-22 MED ORDER — ROCURONIUM BROMIDE 100 MG/10ML IV SOLN
INTRAVENOUS | Status: DC | PRN
  Administered 2024-06-22: 50 mg via INTRAVENOUS

## 2024-06-22 MED ORDER — PROPOFOL 10 MG/ML IV BOLUS
INTRAVENOUS | Status: DC | PRN
  Administered 2024-06-22: 80 mg via INTRAVENOUS

## 2024-06-22 MED ORDER — LIDOCAINE HCL (PF) 2 % IJ SOLN
INTRAMUSCULAR | Status: AC
  Filled 2024-06-22: qty 5

## 2024-06-22 MED ORDER — HYDROMORPHONE HCL 2 MG/ML IJ SOLN
INTRAMUSCULAR | Status: AC
  Filled 2024-06-22: qty 1

## 2024-06-22 MED ORDER — INSULIN ASPART 100 UNIT/ML IJ SOLN
0.0000 [IU] | INTRAMUSCULAR | Status: DC | PRN
  Administered 2024-06-22: 2 [IU] via SUBCUTANEOUS

## 2024-06-22 MED ORDER — SODIUM CHLORIDE 0.9 % IV SOLN
2.0000 g | INTRAVENOUS | Status: AC
  Administered 2024-06-22: 2 g via INTRAVENOUS
  Filled 2024-06-22: qty 2

## 2024-06-22 MED ORDER — SUGAMMADEX SODIUM 200 MG/2ML IV SOLN
INTRAVENOUS | Status: DC | PRN
Start: 2024-06-22 — End: 2024-06-22
  Administered 2024-06-22: 200 mg via INTRAVENOUS

## 2024-06-22 MED ORDER — LIDOCAINE HCL (CARDIAC) PF 100 MG/5ML IV SOSY
PREFILLED_SYRINGE | INTRAVENOUS | Status: DC | PRN
  Administered 2024-06-22: 60 mg via INTRAVENOUS

## 2024-06-22 MED ORDER — FENTANYL CITRATE (PF) 100 MCG/2ML IJ SOLN
INTRAMUSCULAR | Status: AC
  Filled 2024-06-22: qty 2

## 2024-06-22 MED ORDER — FENTANYL CITRATE (PF) 100 MCG/2ML IJ SOLN
INTRAMUSCULAR | Status: DC | PRN
  Administered 2024-06-22: 25 ug via INTRAVENOUS
  Administered 2024-06-22: 50 ug via INTRAVENOUS
  Administered 2024-06-22: 25 ug via INTRAVENOUS

## 2024-06-22 MED ORDER — CHLORHEXIDINE GLUCONATE 0.12 % MT SOLN
15.0000 mL | Freq: Once | OROMUCOSAL | Status: AC
  Administered 2024-06-22: 15 mL via OROMUCOSAL

## 2024-06-22 MED ORDER — ACETAMINOPHEN 500 MG PO TABS
1000.0000 mg | ORAL_TABLET | ORAL | Status: DC
  Filled 2024-06-22: qty 2

## 2024-06-22 MED ORDER — ALVIMOPAN 12 MG PO CAPS
12.0000 mg | ORAL_CAPSULE | ORAL | Status: AC
  Administered 2024-06-22: 12 mg via ORAL
  Filled 2024-06-22: qty 1

## 2024-06-22 MED ORDER — DEXTROSE-SODIUM CHLORIDE 5-0.45 % IV SOLN: INTRAVENOUS | Status: DC

## 2024-06-22 MED ORDER — LACTATED RINGERS IR SOLN
Status: DC | PRN
Start: 2024-06-22 — End: 2024-06-22
  Administered 2024-06-22: 1000 mL

## 2024-06-22 MED ORDER — HYDROMORPHONE HCL 1 MG/ML IJ SOLN
INTRAMUSCULAR | Status: DC | PRN
  Administered 2024-06-22 (×2): .25 mg via INTRAVENOUS

## 2024-06-22 MED ORDER — ACETAMINOPHEN 10 MG/ML IV SOLN
INTRAVENOUS | Status: DC | PRN
  Administered 2024-06-22: 1000 mg via INTRAVENOUS

## 2024-06-22 MED ORDER — ENSURE PRE-SURGERY PO LIQD: 296.0000 mL | Freq: Once | ORAL | Status: DC

## 2024-06-22 MED ORDER — ACETAMINOPHEN 10 MG/ML IV SOLN
INTRAVENOUS | Status: AC
  Filled 2024-06-22: qty 100

## 2024-06-22 MED ORDER — SUGAMMADEX SODIUM 200 MG/2ML IV SOLN
INTRAVENOUS | Status: AC
  Filled 2024-06-22: qty 2

## 2024-06-22 MED ORDER — PROPOFOL 10 MG/ML IV BOLUS
INTRAVENOUS | Status: AC
  Filled 2024-06-22: qty 20

## 2024-06-22 MED ORDER — SODIUM CHLORIDE 0.9 % IV SOLN: INTRAVENOUS | Status: AC

## 2024-06-22 MED ORDER — BUPIVACAINE-EPINEPHRINE (PF) 0.25% -1:200000 IJ SOLN
INTRAMUSCULAR | Status: DC | PRN
Start: 2024-06-22 — End: 2024-06-22
  Administered 2024-06-22: 40 mL

## 2024-06-22 MED ORDER — OXYCODONE HCL 5 MG/5ML PO SOLN: 5.0000 mg | Freq: Once | ORAL | Status: DC | PRN

## 2024-06-22 MED ORDER — OXYCODONE HCL 5 MG PO TABS: 5.0000 mg | ORAL_TABLET | Freq: Once | ORAL | Status: DC | PRN

## 2024-06-22 MED ORDER — FENTANYL CITRATE PF 50 MCG/ML IJ SOSY: 25.0000 ug | PREFILLED_SYRINGE | INTRAMUSCULAR | Status: DC | PRN

## 2024-06-22 MED ORDER — ONDANSETRON HCL 4 MG/2ML IJ SOLN: 4.0000 mg | Freq: Four times a day (QID) | INTRAMUSCULAR | Status: DC | PRN

## 2024-06-22 MED ORDER — OXYCODONE HCL 5 MG PO TABS
5.0000 mg | ORAL_TABLET | ORAL | Status: DC | PRN
  Administered 2024-06-22: 5 mg via ORAL
  Administered 2024-06-24: 10 mg via ORAL
  Filled 2024-06-22: qty 2
  Filled 2024-06-22: qty 1

## 2024-06-22 MED ORDER — ACETAMINOPHEN 500 MG PO TABS
1000.0000 mg | ORAL_TABLET | Freq: Four times a day (QID) | ORAL | Status: DC
  Administered 2024-06-23 – 2024-06-25 (×8): 1000 mg via ORAL
  Filled 2024-06-22 (×10): qty 2

## 2024-06-22 MED ORDER — 0.9 % SODIUM CHLORIDE (POUR BTL) OPTIME
TOPICAL | Status: DC | PRN
  Administered 2024-06-22: 2000 mL

## 2024-06-22 SURGICAL SUPPLY — 1 items: NDL INSUFFLATION 14GA 120MM (NEEDLE) IMPLANT

## 2024-06-22 NOTE — Plan of Care (Signed)
   Problem: Coping: Goal: Ability to adjust to condition or change in health will improve Outcome: Progressing

## 2024-06-22 NOTE — Progress Notes (Signed)
 OT Cancellation Note  Patient Details Name: Jake Bartlett MRN: [id number] DOB: 01/05/1941   Cancelled Treatment:    Reason Eval/Treat Not Completed: Medical issues which prohibited therapy. Pt is off the unit for surgery today. Will follow-up another day.   [name], OTR/L 06/22/2024, 1:17 PM

## 2024-06-22 NOTE — Anesthesia Preprocedure Evaluation (Addendum)
 Anesthesia Evaluation  Patient identified by MRN, date of birth, ID band Patient awake    Reviewed: Allergy & Precautions, H&P , NPO status , Patient's Chart, lab work & pertinent test results  Airway Mallampati: II   Neck ROM: limited    Dental   Pulmonary neg pulmonary ROS   breath sounds clear to auscultation       Cardiovascular negative cardio ROS  Rhythm:regular Rate:Tachycardia     Neuro/Psych Parkinson's dz CVA    GI/Hepatic Cecal CA   Endo/Other  diabetes, Type 2    Renal/GU Renal InsufficiencyRenal disease+UTI with urosepsis on admission.     Musculoskeletal   Abdominal   Peds  Hematology   Anesthesia Other Findings   Reproductive/Obstetrics                              Anesthesia Physical Anesthesia Plan  ASA: 3  Anesthesia Plan: General   Post-op Pain Management:    Induction: Intravenous  PONV Risk Score and Plan: 2 and Ondansetron , Dexamethasone  and Treatment may vary due to age or medical condition  Airway Management Planned: Oral ETT  Additional Equipment:   Intra-op Plan:   Post-operative Plan: Extubation in OR and Possible Post-op intubation/ventilation  Informed Consent: I have reviewed the patients History and Physical, chart, labs and discussed the procedure including the risks, benefits and alternatives for the proposed anesthesia with the patient or authorized representative who has indicated his/her understanding and acceptance.     Dental advisory given  Plan Discussed with: CRNA, Surgeon and Anesthesiologist  Anesthesia Plan Comments:          Anesthesia Quick Evaluation

## 2024-06-22 NOTE — Anesthesia Procedure Notes (Signed)
 Procedure Name: Intubation Date/Time: 06/22/2024 2:39 PM  Performed by: Belvie Valri NOVAK, CRNAPre-anesthesia Checklist: Patient identified, Emergency Drugs available, Suction available and Patient being monitored Patient Re-evaluated:Patient Re-evaluated prior to induction Oxygen Delivery Method: Circle System Utilized Preoxygenation: Pre-oxygenation with 100% oxygen Induction Type: IV induction Ventilation: Mask ventilation without difficulty Laryngoscope Size: Mac and 4 Grade View: Grade III Tube type: Oral Tube size: 7.5 mm Number of attempts: 1 Airway Equipment and Method: Stylet and Oral airway Placement Confirmation: ETT inserted through vocal cords under direct vision, positive ETCO2 and breath sounds checked- equal and bilateral Secured at: 22 cm Tube secured with: Tape Dental Injury: Teeth and Oropharynx as per pre-operative assessment

## 2024-06-22 NOTE — Transfer of Care (Signed)
 Immediate Anesthesia Transfer of Care Note  Patient: Jake Bartlett  Procedure(s) Performed: LAPAROSCOPIC RIGHT COLECTOMY  Patient Location: PACU  Anesthesia Type:General  Level of Consciousness: drowsy  Airway & Oxygen Therapy: Patient Spontanous Breathing and Patient connected to face mask oxygen  Post-op Assessment: Report given to RN and Post -op Vital signs reviewed and stable  Post vital signs: Reviewed and stable  Last Vitals:  Vitals Value Taken Time  BP 124/54 06/22/24 17:04  Temp    Pulse 77 06/22/24 17:06  Resp 25 06/22/24 17:06  SpO2 100 % 06/22/24 17:06  Vitals shown include unfiled device data.  Last Pain:  Vitals:   06/22/24 1030  TempSrc:   PainSc: 0-No pain      Patients Stated Pain Goal: 5 (06/22/24 1000)  Complications: No notable events documented.

## 2024-06-22 NOTE — Progress Notes (Signed)
 Subjective No acute events.  Denies abdominal pain or nausea/vomiting.  Certainly more awake/alert relative to what I have been told from prior encounters by my partners.  His wife is present at bedside.  I have also spent time discussing his case with Dr. Raenelle and all parties are in agreement with proceeding with surgery  Objective: Vital signs in last 24 hours: Temp:  [97.6 F (36.4 C)-98.4 F (36.9 C)] 98.3 F (36.8 C) (09/02 1000) Pulse Rate:  [63-67] 67 (09/02 1000) Resp:  [16-21] 16 (09/02 1000) BP: (101-141)/(52-66) 127/66 (09/02 1000) SpO2:  [98 %-100 %] 98 % (09/02 1000) Weight:  [78.9 kg] 78.9 kg (09/02 1000) Last BM Date : 06/20/24  Intake/Output from previous day: 09/01 0701 - 09/02 0700 In: 1997.6 [P.O.:958; I.V.:939.6; IV Piggyback:100] Out: 900 [Urine:900] Intake/Output this shift: No intake/output data recorded.  Gen: NAD, comfortable CV: RRR Pulm: Normal work of breathing Abd: Soft, NT/ND  Ext: SCDs in place  Lab Results: CBC  Recent Labs    06/20/24 1122 06/21/24 0950  WBC 12.5* 8.1  HGB 7.4* 8.1*  HCT 24.2* 26.4*  PLT 462* 415*   BMET No results for input(s): NA, K, CL, CO2, GLUCOSE, BUN, CREATININE, CALCIUM  in the last 72 hours. PT/INR No results for input(s): LABPROT, INR in the last 72 hours. ABG No results for input(s): PHART, HCO3 in the last 72 hours.  Invalid input(s): PCO2, PO2  Studies/Results:  Anti-infectives: Anti-infectives (From admission, onward)    Start     Dose/Rate Route Frequency Ordered Stop   06/22/24 1015  cefoTEtan  (CEFOTAN ) 2 g in sodium chloride  0.9 % 100 mL IVPB        2 g 200 mL/hr over 30 Minutes Intravenous On call to O.R. 06/22/24 1005 06/23/24 0559   06/17/24 1500  cefTRIAXone  (ROCEPHIN ) 2 g in sodium chloride  0.9 % 100 mL IVPB  Status:  Discontinued        2 g 200 mL/hr over 30 Minutes Intravenous Every 24 hours 06/16/24 1710 06/22/24 0854   06/16/24 1500  cefTRIAXone   (ROCEPHIN ) 1 g in sodium chloride  0.9 % 100 mL IVPB        1 g 200 mL/hr over 30 Minutes Intravenous  Once 06/16/24 1447 06/16/24 1548        Assessment/Plan: Patient Active Problem List   Diagnosis Date Noted   Severe sepsis (HCC) 06/16/2024   Sepsis secondary to UTI (HCC) 06/16/2024   Open wound of right heel 06/16/2024   ICH (intracerebral hemorrhage) (HCC) 04/12/2022   Pressure injury of skin 04/12/2022   Large cecal mass, anemia  - Staging CT scans including CT chest did not demonstrate evidence of distant metastatic disease.  Regardless of the results of pathology from the colonoscopy, this endoscopically does appear to be consistent with a cancer as it does on the CT scan.  Additionally, he has underlying anemia which I suspect this is been contributing to.  -The anatomy and physiology of the GI tract was discussed with him and his wife. The pathophysiology of colon masses/cancer was discussed as well. -We have discussed surgery, laparoscopic right hemicolectomy  -The planned procedure, material risks (including, but not limited to, pain, bleeding, infection, scarring, need for blood transfusion, damage to surrounding structures- blood vessels/nerves/viscus/organs, damage to ureter, urine leak, leak from anastomosis, need for additional procedures, blood clot, pulmonary embolus, scenarios where a stoma may be necessary and where it may be permanent, worsening of pre-existing medical conditions, chronic diarrhea, constipation secondary to narcotic use,  hernia, recurrence, pneumonia, heart attack, stroke, death) benefits and alternatives to surgery were discussed at length. The patient's questions were answered to he and his wife's satisfaction, they voiced understanding and elected to proceed with surgery. Additionally, we discussed typical postoperative expectations and the recovery process.  -We also spent time discussing relative risks of surgery in his case and they certainly are  elevated relative to many patients.  We discussed the high risk for infection, prolonged wound healing, anastomotic concerns/leaks, heart attack, stroke, pneumonia, DVT/PE, prolonged recovery and high potential for disposition to skilled nursing facility/rehab.  -Dr. Raenelle came back by and spoke with him again after I is seen him this morning.  He had discussed with them as well options including surgery versus nonoperative management/palliative type options.  They remain at the opinion they like to proceed with surgery which I think given all the above is also reasonable.   LOS: 6 days   I spent a total of 50 minutes in both face-to-face and non-face-to-face activities, excluding procedures performed, for this visit on the date of this encounter.  Lonni Pizza, MD [facility], A [facility]

## 2024-06-22 NOTE — Progress Notes (Signed)
 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  On arrival to the ER he was hypotensive.  Creatinine was 2.8, lactic acid was 2.7.  UA was abnormal.  Patient was started on antibiotics and admitted to the hospital.  A CT scan abdomen pelvis showed significant increase in the cecal mass consistent with colonic malignancy.  GI was consulted. 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 9/2, decided to undergo right hemicolectomy with surgery.  Subjective:  Patient seen and examined.  Today he was more alert awake and interactive.  Of course he has been hungry and not eating well for the last many days with procedures and staying on liquid diet.  Blood sugars are stable today. Wife arrived at the bedside.  Discussed with both husband and wife that the surgery is large undertaking and advised them to learn more details, get more informed about the surgery, recovery as well as disadvantageous or possible problems with surgery. Conversation with Dr. Teresa in details.  Patient does carry risk of surgery and recovery however given a localized tumor, benefits of tumor excision may outweigh side effects.  After careful discussion, patient and wife decided to go for right colectomy.   Assessment & Plan:   Cecal mass, large cecal tumor, chronic anemia. 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 Colonoscopy  villous fungating and infiltrative nonobstructive large mass was found in the cecum.  Biopsied.  Final pathology pending. Surgery consulted.  Going for colectomy today. Discussed with  urology, they do not recommend cystoscopy is needed.  Acute UTI present on admission, severe sepsis suspected present on admission due to hypotension, lactic acidosis. Urine culture with multiple species.  Blood cultures negative.  Previously Klebsiella UTI.  Patient on Rocephin .  With multiple bacteria, will complete 5 days of IV Rocephin .  Today receiving perioperative antibiotics.  Right heel wound: Local wound care.  CKD stage IV: Kidney function at baseline.  Monitor.  Recheck tomorrow morning.  Acute on chronic normocytic anemia, iron  deficiency: Hemoglobin at baseline 8-10.  Hemoglobin 8 today.  No evidence of overt bleeding.  Given IV iron .  Will monitor and transfuse if less than 7.  Type 2 diabetes, well-controlled on insulin  at home.  Hypoglycemic due to inadequate oral intake.  Will keep on 5% dextrose  today.  Monitor closely.  Holding all long-acting insulin .  Essential hypertension: Blood pressure stable.  Meds on hold.  Debility, deconditioning, history of stroke and Parkinson's: Lives at home.  Significantly weak than baseline.  Will need rehab after surgery.    DVT prophylaxis: heparin  injection 5,000 Units Start: 06/19/24 0600 SCDs Start: 06/16/24 1711   Code Status: Full code Family Communication: Wife at the bedside. Disposition Plan: Status is: Inpatient Remains inpatient appropriate because: Inpatient procedures planned.  For surgery today.     Consultants:  GI Surgery  Procedures:  None  Antimicrobials:  Rocephin  8/27---      Objective: Vitals:   06/21/24 1424 06/21/24 2021 06/22/24 0433 06/22/24 1000  BP: (!) 110/59 (!) 139/57 (!) 141/58 127/66  Pulse: 66 65 63 67  Resp: 18 19 (!) 21 16  Temp:  98.4 F (36.9 C) 98  F (36.7 C) 98.3 F (36.8 C)  TempSrc:  Oral Oral Oral  SpO2: 100% 100% 99% 98%  Weight:    78.9 kg  Height:    5' 8 (1.727 m)    Intake/Output Summary (Last 24 hours) at 06/22/2024 1155 Last data filed at 06/22/2024  0407 Gross per 24 hour  Intake 1877.62 ml  Output 900 ml  Net 977.62 ml   Filed Weights   06/16/24 1931 06/22/24 1000  Weight: 78.9 kg 78.9 kg    Examination:  General exam: Frail and debilitated.  Flat affect.  More interactive today. Respiratory system: Clear to auscultation. Respiratory effort normal.  No added sounds. Cardiovascular system: S1 & S2 heard, RRR.  Gastrointestinal system: Soft.  Nontender.  Bowel sound present. Central nervous system: Alert and oriented.  Flat affect. Patient has pressure ulcer right heel.   Data Reviewed: I have personally reviewed following labs and imaging studies  CBC: Recent Labs  Lab 06/16/24 1208 06/17/24 0504 06/18/24 0513 06/19/24 0605 06/20/24 1122 06/21/24 0950  WBC 11.7* 8.8 12.5* 8.1 12.5* 8.1  NEUTROABS 8.6*  --   --   --   --  5.8  HGB 9.2* 7.5* 7.6* 7.8* 7.4* 8.1*  HCT 30.6* 24.5* 24.7* 26.2* 24.2* 26.4*  MCV 100.7* 101.7* 100.8* 101.9* 99.6 97.1  PLT 505* 380 395 394 462* 415*   Basic Metabolic Panel: Recent Labs  Lab 06/16/24 1208 06/17/24 0504 06/18/24 0513 06/19/24 0605  NA 137 138 136 144  K 4.7 4.3 4.5 3.6  CL 103 106 104 106  CO2 19* 21* 20* 22  GLUCOSE 150* 109* 111* 103*  BUN 43* 40* 38* 33*  CREATININE 2.85* 2.60* 2.48* 2.43*  CALCIUM  8.3* 7.6* 7.6* 7.9*   GFR: Estimated Creatinine Clearance: 21.9 mL/min (A) (by C-G formula based on SCr of 2.43 mg/dL (H)). Liver Function Tests: Recent Labs  Lab 06/16/24 1208  AST 25  ALT <5  ALKPHOS 158*  BILITOT 0.4  PROT 6.4*  ALBUMIN  2.7*   Recent Labs  Lab 06/16/24 1208  LIPASE 37   No results for input(s): AMMONIA in the last 168 hours. Coagulation Profile: No results for input(s): INR, PROTIME in the last 168 hours. Cardiac Enzymes: No results for input(s): CKTOTAL, CKMB, CKMBINDEX, TROPONINI in the last 168 hours. BNP (last 3 results) No results for input(s): PROBNP in the last 8760 hours. HbA1C: No results for input(s):  HGBA1C in the last 72 hours.  CBG: Recent Labs  Lab 06/21/24 2018 06/21/24 2337 06/22/24 0430 06/22/24 0713 06/22/24 1006  GLUCAP 149* 196* 158* 159* 152*   Lipid Profile: No results for input(s): CHOL, HDL, LDLCALC, TRIG, CHOLHDL, LDLDIRECT in the last 72 hours. Thyroid Function Tests: No results for input(s): TSH, T4TOTAL, FREET4, T3FREE, THYROIDAB in the last 72 hours. Anemia Panel: No results for input(s): VITAMINB12, FOLATE, FERRITIN, TIBC, IRON , RETICCTPCT in the last 72 hours.  Sepsis Labs: Recent Labs  Lab 06/16/24 1215 06/16/24 1511  LATICACIDVEN 2.7* 1.4    Recent Results (from the past 240 hours)  Blood culture (routine x 2)     Status: None   Collection Time: 06/16/24 12:08 PM   Specimen: BLOOD  Result Value Ref Range Status   Specimen Description   Final    BLOOD SITE NOT SPECIFIED Performed at [facility], [address] [place], [place] [address]    Special Requests   Final    BOTTLES DRAWN AEROBIC AND ANAEROBIC Blood Culture adequate volume Performed at  [facility], [address], [place], [place] [address]    Culture   Final    NO GROWTH 5 DAYS Performed at [facility], [address] [place], [place] [address]    Report Status 06/21/2024 FINAL  Final  Blood culture (routine x 2)     Status: None   Collection Time: 06/16/24  3:17 PM   Specimen: BLOOD  Result Value Ref Range Status   Specimen Description   Final    BLOOD SITE NOT SPECIFIED Performed at [facility], [address], [place], [place] [address]    Special Requests   Final    BOTTLES DRAWN AEROBIC AND ANAEROBIC Blood Culture adequate volume Performed at [facility], [address], [place], [place] [address]    Culture   Final    NO GROWTH 5 DAYS Performed at [facility], [address], [place], [place] [address]    Report Status 06/21/2024 FINAL   Final  Urine Culture     Status: Abnormal   Collection Time: 06/16/24  8:36 PM   Specimen: Urine, Clean Catch  Result Value Ref Range Status   Specimen Description   Final    URINE, CLEAN CATCH Performed at [facility], [address] [place], [place] [address]    Special Requests   Final    NONE Performed at [facility], [address] [place], [place] [address]    Culture MULTIPLE SPECIES PRESENT, SUGGEST RECOLLECTION (A)  Final   Report Status 06/17/2024 FINAL  Final  MRSA Next Gen by PCR, Nasal     Status: None   Collection Time: 06/16/24  8:36 PM   Specimen: Urine, Clean Catch; Nasal Swab  Result Value Ref Range Status   MRSA by PCR Next Gen NOT DETECTED NOT DETECTED Final    Comment: (NOTE) The GeneXpert MRSA Assay (FDA approved for NASAL specimens only), is one component of a comprehensive MRSA colonization surveillance program. It is not intended to diagnose MRSA infection nor to guide or monitor treatment for MRSA infections. Test performance is not FDA approved in patients less than 60 years old. Performed at [facility], [address], [place], [place] [address]   Urine Culture (for pregnant, neutropenic or urologic patients or patients with an indwelling urinary catheter)     Status: Abnormal   Collection Time: 06/18/24 11:21 AM   Specimen: Urine, Clean Catch  Result Value Ref Range Status   Specimen Description   Final    URINE, CLEAN CATCH Performed at [facility], [address], [place], [place] [address]    Special Requests   Final    NONE Performed at [facility], [address] [place], [place] [address]    Culture MULTIPLE SPECIES PRESENT, SUGGEST RECOLLECTION (A)  Final   Report Status 06/19/2024 FINAL  Final         Radiology Studies: CT CHEST WO CONTRAST Result Date: 06/21/2024 CLINICAL DATA:  Occult malignancy. EXAM: CT CHEST WITHOUT  CONTRAST TECHNIQUE: Multidetector CT imaging of the chest was performed following the standard protocol without IV contrast. RADIATION DOSE REDUCTION: This exam was performed according to the departmental dose-optimization program which includes automated exposure control, adjustment of the mA and/or kV according to patient size and/or use of iterative reconstruction technique. COMPARISON:  06/16/2024, 01/25/2019. FINDINGS: Cardiovascular: The heart is normal in size and there is a trace pericardial effusion. Multi-vessel  coronary artery calcifications are noted. There is atherosclerotic calcification of the aorta without evidence of aneurysm. The pulmonary trunk is normal in caliber. Mediastinum/Nodes: There is a prominent subcarinal lymph node measuring 1 cm. No axillary lymphadenopathy. Evaluation of the hila is limited due to lack of IV contrast. The thyroid gland, trachea, and esophagus are within normal limits. Lungs/Pleura: There is a small pleural effusion on the left and trace pleural effusion on the right. Consolidation is noted in the lower lobes bilaterally, greater on the left than on the right. Atelectasis is noted in the lingular segment of the left upper lobe. No pneumothorax is seen. There is a 3 mm nodule in the right upper lobe, axial image 21. Upper Abdomen: Scattered hypodensities are noted in the liver, likely cysts. There is a cyst in the mid left kidney. No acute abnormality. Gynecomastia is present bilaterally. Musculoskeletal: Degenerative changes are present in the thoracic spine and bilateral shoulders. No acute or suspicious osseous abnormality is seen. IMPRESSION: 1. No focal mass is seen. 2. Bilateral lower lobe consolidation, possible atelectasis or infiltrate. Follow-up is recommended until resolution to exclude the possibility of underlying malignancy. 3. Small bilateral pleural effusions. 4. Coronary artery calcifications. 5. Aortic atherosclerosis. Electronically Signed   By:  Leita Birmingham M.D.   On: 06/21/2024 16:31        Scheduled Meds:  acetaminophen   1,000 mg Oral On Call to OR   [MAR Hold] carbidopa -levodopa   1 tablet Oral BID   [MAR Hold] cholecalciferol   1,000 Units Oral Daily   [MAR Hold] escitalopram   10 mg Oral Daily   [MAR Hold] folic acid   1 mg Oral Daily   [MAR Hold] Gerhardt's butt cream   Topical TID   [MAR Hold] heparin  injection (subcutaneous)  5,000 Units Subcutaneous Q8H   [MAR Hold] leptospermum manuka honey  1 Application Topical Daily   [MAR Hold] multivitamin with minerals  1 tablet Oral Daily   [MAR Hold] polyethylene glycol  17 g Oral Daily   [MAR Hold] pramipexole   0.25 mg Oral QHS   [MAR Hold] rosuvastatin   10 mg Oral QHS   [MAR Hold] sodium bicarbonate   650 mg Oral TID   [MAR Hold] tamsulosin   0.4 mg Oral Daily   Continuous Infusions:  sodium chloride  40 mL/hr at 06/22/24 1044   cefoTEtan  (CEFOTAN ) IV     dextrose  5 % and 0.45 % NaCl 75 mL/hr at 06/21/24 1430     LOS: 6 days    Time spent: 40 minutes    Renato Applebaum, MD [facility]

## 2024-06-22 NOTE — Anesthesia Postprocedure Evaluation (Signed)
 Anesthesia Post Note  Patient: Jake Bartlett  Procedure(s) Performed: LAPAROSCOPIC RIGHT COLECTOMY     Patient location during evaluation: PACU Anesthesia Type: General Level of consciousness: awake and alert Pain management: pain level controlled Vital Signs Assessment: post-procedure vital signs reviewed and stable Respiratory status: spontaneous breathing, nonlabored ventilation and respiratory function stable Cardiovascular status: blood pressure returned to baseline and stable Postop Assessment: no apparent nausea or vomiting Anesthetic complications: no   No notable events documented.  Last Vitals:  Vitals:   06/22/24 1745 06/22/24 1756  BP: (!) 120/58 (!) 142/71  Pulse: 78 75  Resp: (!) 23 20  Temp: (!) 36.4 C (!) 36.4 C  SpO2: 95% 98%    Last Pain:  Vitals:   06/22/24 1756  TempSrc: Oral  PainSc:                  Butler Levander Pinal

## 2024-06-22 NOTE — Op Note (Signed)
 PATIENT: Jake Bartlett  83 y.o. male  Patient Care Team: Alyse Bradley, MD as PCP - General (Internal Medicine)  PREOP DIAGNOSIS: Cecal mass  POSTOP DIAGNOSIS: Same  PROCEDURE: Laparoscopic right hemicolectomy   SURGEON: Lonni HERO. Shamar Engelmann, MD  ASSISTANT: OR Staff  ANESTHESIA: General endotracheal  EBL: 25 mL Total I/O In: 450 [IV Piggyback:450] Out: 225 [Urine:200; Blood:25]  DRAINS: none  SPECIMEN: Right colon and segment of ileum (en bloc) - including distal + terminal ileum, cecum, ascending and proximal transverse colon.  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 Dilated/enlarged appendix consistent with partial obstruction of his appendiceal orifice by this mass as well. No evident metastatic disease on peritoneal surfaces or liver.  NARRATIVE:  The patient was seen in the pre-op holding area. The risks, benefits, complications, treatment options, and expected outcomes were previously discussed with the patient. The patient agreed with the proposed plan and has signed the informed consent form. The patient was brought to the operating room by the surgical team, identified as Elna Search, and the procedure verified. placed supine on the operating table and SCD's were applied. General endotracheal anesthesia was induced without difficulty. He was then positioned supine with arms tucked.  Pressure points were evaluated and padded.  A foley catheter was then placed by nursing under sterile conditions. Hair on the abdomen was clipped.  He was secured to the operating table. The abdomen was then prepped and draped in the standard sterile fashion. A time out was completed and the above information confirmed and need for preoperative antibiotics.  Beginning with the extraction port, a supraumbilical incision was made and carried  down to the midline fascia. This was then incised with electrocautery. The peritoneum was identified and elevated between clamps and carefully opened sharply. A small Alexis wound protector with a cap and associated port was then placed. The abdomen was insufflated to 15 mmHg with Co2. A laparoscope was placed and camera inspection revealed no evidence of injury. Bilateral TAP blocks were then performed under laparoscopic visualization using 0.25% marcaine  with epinepherine. 3 additional ports were then placed under direct laparoscopic visualization - two in the left hemiabdomen and one in the right abdomen. The abdomen was surveyed. The liver and peritoneum appeared normal.  There were no signs of metastatic disease.  He does have a penile prosthesis in the reservoir is noted in the abdomen in its expected location.  There is a large mass in the cecum which is firm/hard but mobile.  It also appears to involve a loop of neighboring distal ileum.  The patient was positioned in trendelenburg with left side down. The ileocolic pedicle was identified. Gentle blunt dissection commenced around the pedicle and the duodenum was identified and freed from the surrounding structures.  The ileocolic pedicle was then circumferentially dissected.  We are able to identify the duodenum through a window medial to this.  The duodenum was able to be carefully mobilized down.  The ileocolic pedicle was then ligated and divided using the Enseal device.  The stump was inspected and noted to be hemostatic.  The terminal ileum was then mobilized from its attachments in the right lower quadrant.  The cecum is enlarged and dilated.  There is no evidence of rupture.  We are able to free the appendix and began by mobilizing the right colon.  There right colon was  mobilized by incising the Deneshia Zucker line of Toldt.  The associate mesocolon was reflected medially.  He is then repositioned in reverse Trendelenburg.  The omentum is elevated  anteriorly and the transverse colon is retracted caudad.  We were able to incise the omentum near its attachments to the transverse colon and gain access to the lesser sac.  The omentum was freed from the colon.  The hepatic flexure was approached and then fully mobilized from this approach as well.  The abdomen is then surveyed.  Hemostasis is verified inspecting both the omentum and the retroperitoneal planes.  At this point, the abdomen was desufflated and the terminal ileum and right colon delivered through the wound protector.  We did have to enlarge our extraction incision to accommodate this fairly large bulky mass.  This mass also involves a distal loop of ileum.  We plan to remove this all en bloc.   A window is created the mesentery at the level of the distal ileum proximal to the mass.  The distal limb is divided using a GIA blue load stapler.  A Babcock clamp is placed on the proximal staple line to assist in maintaining orientation.  The mesentery is then ligated divided using the Enseal device going out to our ileocolic pedicle.  The ascending and transverse colon were then able to be brought through the wound protector.  The mesentery is inspected.  The middle colic's identified.  Included the right branch of the middle colic vessels, a window is created the mesentery at the level the transverse colon.  This location, GIA blue load stapler was used to divide the colon.  The mesentery is then ligated and divided using the Enseal device again including the right branch of the middle colic pedicle with our specimen.  The specimen is passed off.  Attention was turned to creating the ileocolic anastomosis. The ileum and transverse colon were inspected for orientation to ensure no twisting nor bowel included in the mesenteric defect. An anastomosis was created between the ileum and the transverse colon using a 75 mm GIA blue load stapler. The staple line was inspected and noted to be hemostatic.   The common enterotomy channel was closed using a TA 90 blue load stapler. Hemostasis was achieved at the staple line using 3-0 silk U-stitches. 3-0 silk sutures were used to imbricate the corners of the staple line as well.  A 2-0 silk suture was placed securing the apex of the anastomosis. The anastomosis was palpated and noted to be widely patent. This was then placed back into the abdomen. The abdomen was then irrigated with sterile saline and hemostasis verified. The omentum was then brought down over the anastomosis. The wound protector cap was replaced and Co2 reinsufflated. The laparoscopic ports were removed under direct visualization and the sites noted to be hemostatic. The Alexis wound protector was removed, counts were reported correct, and we switched to clean instruments, gowns and drapes.   The fascia was then closed using two running #1 PDS sutures.  The skin of all incision sites was closed with 4-0 monocryl subcuticular suture. Dermabond was placed on the port sites and a sterile dressing was placed over the abdominal incision. All sponge, needle, and instrument counts were reported correct. He was then awakened from anesthesia and sent to the post anesthesia care unit in stable condition.   DISPOSITION: PACU in satisfactory condition

## 2024-06-23 ENCOUNTER — Encounter (HOSPITAL_COMMUNITY): Payer: Self-pay | Admitting: Surgery

## 2024-06-23 DIAGNOSIS — A419 Sepsis, unspecified organism: Secondary | ICD-10-CM | POA: Diagnosis not present

## 2024-06-23 DIAGNOSIS — R6520 Severe sepsis without septic shock: Secondary | ICD-10-CM | POA: Diagnosis not present

## 2024-06-23 LAB — GLUCOSE, CAPILLARY
Glucose-Capillary: 236 mg/dL — ABNORMAL HIGH (ref 70–99)
Glucose-Capillary: 200 mg/dL — ABNORMAL HIGH (ref 70–99)
Glucose-Capillary: 172 mg/dL — ABNORMAL HIGH (ref 70–99)
Glucose-Capillary: 99 mg/dL (ref 70–99)
Glucose-Capillary: 210 mg/dL — ABNORMAL HIGH (ref 70–99)

## 2024-06-23 LAB — CBC
HCT: 28.6 % — ABNORMAL LOW (ref 39.0–52.0)
Hemoglobin: 8.7 g/dL — ABNORMAL LOW (ref 13.0–17.0)
MCH: 30 pg (ref 26.0–34.0)
MCHC: 30.4 g/dL (ref 30.0–36.0)
MCV: 98.6 fL (ref 80.0–100.0)
Platelets: 332 K/uL (ref 150–400)
RBC: 2.9 MIL/uL — ABNORMAL LOW (ref 4.22–5.81)
RDW: 17.6 % — ABNORMAL HIGH (ref 11.5–15.5)
WBC: 9.8 K/uL (ref 4.0–10.5)
nRBC: 0 % (ref 0.0–0.2)

## 2024-06-23 LAB — BASIC METABOLIC PANEL WITH GFR
Anion gap: 12 (ref 5–15)
BUN: 18 mg/dL (ref 8–23)
CO2: 21 mmol/L — ABNORMAL LOW (ref 22–32)
Calcium: 7.5 mg/dL — ABNORMAL LOW (ref 8.9–10.3)
Chloride: 107 mmol/L (ref 98–111)
Creatinine, Ser: 2.24 mg/dL — ABNORMAL HIGH (ref 0.61–1.24)
GFR, Estimated: 28 mL/min — ABNORMAL LOW (ref >60)
Glucose, Bld: 212 mg/dL — ABNORMAL HIGH (ref 70–99)
Potassium: 3.9 mmol/L (ref 3.5–5.1)
Sodium: 140 mmol/L (ref 135–145)

## 2024-06-23 LAB — CEA

## 2024-06-23 LAB — SURGICAL PATHOLOGY

## 2024-06-23 MED ORDER — CHLORHEXIDINE GLUCONATE CLOTH 2 % EX PADS
6.0000 | MEDICATED_PAD | Freq: Every day | CUTANEOUS | Status: DC
  Administered 2024-06-23 – 2024-06-25 (×2): 6 via TOPICAL

## 2024-06-23 MED ORDER — INSULIN ASPART 100 UNIT/ML IJ SOLN
0.0000 [IU] | Freq: Every day | INTRAMUSCULAR | Status: DC
  Administered 2024-06-23: 2 [IU] via SUBCUTANEOUS

## 2024-06-23 MED ORDER — ENSURE PLUS HIGH PROTEIN PO LIQD
237.0000 mL | Freq: Two times a day (BID) | ORAL | Status: DC
  Administered 2024-06-24 – 2024-06-25 (×2): 237 mL via ORAL

## 2024-06-23 MED ORDER — INSULIN ASPART 100 UNIT/ML IJ SOLN
0.0000 [IU] | Freq: Three times a day (TID) | INTRAMUSCULAR | Status: DC
  Administered 2024-06-23 – 2024-06-24 (×2): 1 [IU] via SUBCUTANEOUS
  Administered 2024-06-25: 3 [IU] via SUBCUTANEOUS

## 2024-06-23 MED ORDER — MORPHINE SULFATE (PF) 2 MG/ML IV SOLN
2.0000 mg | INTRAVENOUS | Status: DC | PRN
  Administered 2024-06-24 – 2024-06-25 (×4): 2 mg via INTRAVENOUS
  Filled 2024-06-23 (×4): qty 1

## 2024-06-23 NOTE — Progress Notes (Signed)
 PT Cancellation Note  Patient Details Name: Jake Bartlett MRN: [id number] DOB: 1941/03/19   Cancelled Treatment:    Reason Eval/Treat Not Completed:  Attempted PT tx session-pt declined participation due to pain. Will check back another day.    Dannial SQUIBB, PT Acute Rehabilitation  Office: [phone]

## 2024-06-23 NOTE — Progress Notes (Addendum)
 1 Day Post-Op  Subjective: CC: He reports abdominal pain is well controlled. Tolerating cld without n/v. No flatus or BM.  Foley in place - RN investigating output as no UOP documented over the last 12 hours, there is 250cc straw colored urine in foley bag currently. Has not been oob. He walks with a walker at baseline and lives with his wife.   Afebrile. WBC wnl. Hgb stable. Cr stable.   Objective: Vital signs in last 24 hours: Temp:  [97.3 F (36.3 C)-98.2 F (36.8 C)] 97.6 F (36.4 C) (09/03 0958) Pulse Rate:  [69-80] 69 (09/03 0958) Resp:  [16-27] 16 (09/03 0958) BP: (110-166)/(54-82) 133/61 (09/03 0958) SpO2:  [94 %-100 %] 98 % (09/03 0958) Last BM Date : 06/20/24  Intake/Output from previous day: 09/02 0701 - 09/03 0700 In: 1280.9 [I.V.:830.9; IV Piggyback:450] Out: 225 [Urine:200; Blood:25] Intake/Output this shift: No intake/output data recorded.  PE: Gen:  Alert, NAD, pleasant Abd: Soft, mild distension, generalized ttp that is most around his incisions, no rigidity or guarding. Midline and laparoscopic incisions with dermabond in place, cdi.  GU: Foley in place with straw colored urine in foley bag - 250cc currently.   Lab Results:  Recent Labs    06/21/24 0950 06/23/24 0459  WBC 8.1 9.8  HGB 8.1* 8.7*  HCT 26.4* 28.6*  PLT 415* 332   BMET Recent Labs    06/23/24 0459  NA 140  K 3.9  CL 107  CO2 21*  GLUCOSE 212*  BUN 18  CREATININE 2.24*  CALCIUM  7.5*   PT/INR No results for input(s): LABPROT, INR in the last 72 hours. CMP     Component Value Date/Time   NA 140 06/23/2024 0459   K 3.9 06/23/2024 0459   CL 107 06/23/2024 0459   CO2 21 (L) 06/23/2024 0459   GLUCOSE 212 (H) 06/23/2024 0459   BUN 18 06/23/2024 0459   CREATININE 2.24 (H) 06/23/2024 0459   CALCIUM  7.5 (L) 06/23/2024 0459   PROT 6.4 (L) 06/16/2024 1208   ALBUMIN  2.7 (L) 06/16/2024 1208   AST 25 06/16/2024 1208   ALT <5 06/16/2024 1208   ALKPHOS 158 (H) 06/16/2024  1208   BILITOT 0.4 06/16/2024 1208   GFRNONAA 28 (L) 06/23/2024 0459   Lipase     Component Value Date/Time   LIPASE 37 06/16/2024 1208    Studies/Results: CT CHEST WO CONTRAST Result Date: 06/21/2024 CLINICAL DATA:  Occult malignancy. EXAM: CT CHEST WITHOUT CONTRAST TECHNIQUE: Multidetector CT imaging of the chest was performed following the standard protocol without IV contrast. RADIATION DOSE REDUCTION: This exam was performed according to the departmental dose-optimization program which includes automated exposure control, adjustment of the mA and/or kV according to patient size and/or use of iterative reconstruction technique. COMPARISON:  06/16/2024, 01/25/2019. FINDINGS: Cardiovascular: The heart is normal in size and there is a trace pericardial effusion. Multi-vessel coronary artery calcifications are noted. There is atherosclerotic calcification of the aorta without evidence of aneurysm. The pulmonary trunk is normal in caliber. Mediastinum/Nodes: There is a prominent subcarinal lymph node measuring 1 cm. No axillary lymphadenopathy. Evaluation of the hila is limited due to lack of IV contrast. The thyroid gland, trachea, and esophagus are within normal limits. Lungs/Pleura: There is a small pleural effusion on the left and trace pleural effusion on the right. Consolidation is noted in the lower lobes bilaterally, greater on the left than on the right. Atelectasis is noted in the lingular segment of the left upper lobe.  No pneumothorax is seen. There is a 3 mm nodule in the right upper lobe, axial image 21. Upper Abdomen: Scattered hypodensities are noted in the liver, likely cysts. There is a cyst in the mid left kidney. No acute abnormality. Gynecomastia is present bilaterally. Musculoskeletal: Degenerative changes are present in the thoracic spine and bilateral shoulders. No acute or suspicious osseous abnormality is seen. IMPRESSION: 1. No focal mass is seen. 2. Bilateral lower lobe  consolidation, possible atelectasis or infiltrate. Follow-up is recommended until resolution to exclude the possibility of underlying malignancy. 3. Small bilateral pleural effusions. 4. Coronary artery calcifications. 5. Aortic atherosclerosis. Electronically Signed   By: Leita Birmingham M.D.   On: 06/21/2024 16:31    Anti-infectives: Anti-infectives (From admission, onward)    Start     Dose/Rate Route Frequency Ordered Stop   06/22/24 1015  cefoTEtan  (CEFOTAN ) 2 g in sodium chloride  0.9 % 100 mL IVPB        2 g 200 mL/hr over 30 Minutes Intravenous On call to O.R. 06/22/24 1005 06/22/24 1445   06/17/24 1500  cefTRIAXone  (ROCEPHIN ) 2 g in sodium chloride  0.9 % 100 mL IVPB  Status:  Discontinued        2 g 200 mL/hr over 30 Minutes Intravenous Every 24 hours 06/16/24 1710 06/22/24 0854   06/16/24 1500  cefTRIAXone  (ROCEPHIN ) 1 g in sodium chloride  0.9 % 100 mL IVPB        1 g 200 mL/hr over 30 Minutes Intravenous  Once 06/16/24 1447 06/16/24 1548        Assessment/Plan POD 1 s/p laparoscopic right hemicolectomy by Dr. Teresa on 06/22/24 - Path pending from colonoscopy and OR - CEA pending. CT chest done.  - Adv to FLD. ADAT to Reg diet - Mobilize, PT  FEN - FLD, ADAT to soft diet. IVF per TRH VTE - SCDs, okay for chem ppx from a general surgery standpoint ID - None currently.  Foley - In place, RN to quantify output. Okay for removal from our standpoint    LOS: 7 days    Ozell CHRISTELLA Shaper, [facility] 06/23/2024, 10:01 AM Please see Amion for pager number during day hours 7:00am-4:30pm

## 2024-06-23 NOTE — Progress Notes (Signed)
 OT Cancellation Note  Patient Details Name: Michaeljoseph Revolorio MRN: [id number] DOB: 02-02-1941   Cancelled Treatment:    Reason Eval/Treat Not Completed: Pain limiting ability to participate. Pt declined to participate in therapy, due to fatigue, malaise and pain. He asked for therapy to check back tomorrow.   [name], OTR/L 06/23/2024, 2:16 PM

## 2024-06-23 NOTE — Inpatient Diabetes Management (Signed)
 Inpatient Diabetes Program Recommendations  AACE/ADA: New Consensus Statement on Inpatient Glycemic Control (2015)  Target Ranges:  Prepandial:   less than 140 mg/dL      Peak postprandial:   less than 180 mg/dL (1-2 hours)      Critically ill patients:  140 - 180 mg/dL   Lab Results  Component Value Date   GLUCAP 200 (H) 06/23/2024   HGBA1C 5.8 (H) 06/16/2024    Review of Glycemic Control  Latest Reference Range & Units 06/22/24 10:06 06/22/24 12:51 06/22/24 16:12 06/22/24 20:19 06/22/24 23:30 06/23/24 04:21 06/23/24 07:42  Glucose-Capillary 70 - 99 mg/dL 847 (H) 854 (H) 817 (H) 158 (H) 192 (H) 236 (H) 200 (H)   Diabetes history: DM 2 Outpatient Diabetes medications: 70/30 20 units qam, 10 units qpm Current orders for Inpatient glycemic control: none A1c 5.8% on 8/27  Note: pt received decadron  5 mg yesterday  Inpatient Diabetes Program Recommendations:    -   Start Novolog  0-6 units tid + hs  Thanks,  Clotilda Bull RN, MSN, BC-ADM Inpatient Diabetes Coordinator Team Pager [phone] (8a-5p)

## 2024-06-23 NOTE — TOC Progression Note (Signed)
 Transition of Care [facility]) - Progression Note    Patient Details  Name: Jake Bartlett MRN: [id number] Date of Birth: 09-06-1941  Transition of Care [facility] CM/SW Contact  Anai Lipson, Nathanel, RN Phone Number: 06/23/2024, 10:35 AM  Clinical Narrative: d/c in 1-2 days spouse [name] chose [facility] [place] aware.       Expected Discharge Plan: Skilled Nursing Facility Barriers to Discharge: Continued Medical Work up               Expected Discharge Plan and Services     Post Acute Care Choice: Skilled Nursing Facility Living arrangements for the past 2 months: Single Family Home                                       Social Drivers of Health (SDOH) Interventions SDOH Screenings   Food Insecurity: No Food Insecurity (06/16/2024)  Housing: Low Risk  (06/16/2024)  Transportation Needs: No Transportation Needs (06/16/2024)  Utilities: Not At Risk (06/16/2024)  Financial Resource Strain: Low Risk  (12/05/2023)   Received from [facility]  Physical Activity: Insufficiently Active (06/24/2023)   [facility]  Social Connections: Moderately Integrated (06/16/2024)  Stress: No Stress Concern Present (06/24/2023)   Received from [facility]  Tobacco Use: Low Risk  (06/22/2024)    Readmission Risk Interventions    06/19/2024    4:28 PM  Readmission Risk Prevention Plan  Post Dischage Appt Complete  Medication Screening Complete  Transportation Screening Complete

## 2024-06-23 NOTE — Progress Notes (Signed)
 PROGRESS NOTE  Jake Bartlett  FMW:[id number] DOB: 1941-02-21 DOA: 06/16/2024 PCP: Alyse Bradley, MD   Brief Narrative: Patient is a 83 year old male with history of CKD stage IV with baseline creatinine around 2.8, chronic normocytic anemia with baseline hemoglobin around 8-10, type 2 diabetes, hypertension, right thalamic ICH with left-sided weakness, Parkinson disease, recent right total hip replacement revision who was sent to the emergency department for the evaluation of weakness, concern for infection.  On presentation, he was hypotensive.  Lab work showed creatinine of 2.8, lactate of 2.7, WBC count of 9.7.  UA was suspicious for UTI, started on ceftriaxone .  X-ray of the right ankle showed soft tissue defect/wound overlying the posterior calcaneus without evidence of osteomyelitis, wound care consulted.  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  General surgery consulted.  Status post laparoscopic right hemicolectomy on 9/2.  Pathology still pending.   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  GI consulted. 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  General surgery consulted.  Status post laparoscopic right hemicolectomy on 9/2.  Pathology still pending.CEA pending.  Currently on full liquid diet, plan to advance the diet to soft as tolerated but he does not have a bowel movement yet. He needs to follow-up with oncology as an outpatient.  Will send message to oncology for follow-up arrangement. CT also showed slight thickened and irregular appearance of the left lateral bladder  wall. A urothelial neoplasm is not excluded.  Recommend further evaluation with cystoscopy.  We recommend to follow-up with urology as an outpatient.  Severe sepsis secondary to UTI: Presented with hypotension, elevated lactate.  UA suspicious for UTI.  Started on ceftriaxone , completed 5 days course. Currently hemodynamically stable with a stable blood pressure.  No leukocytosis or fever  Right heel wound: Right ankle X-ray did not show any osteomyelitis.  Wound care consulted.  Patient needs to follow-up with his wound care clinic  CKD stage IV/metabolic acidosis: Given  gentle iv fluid. Added sodium bicarb tabs.  Baseline creatinine around 2.8.  Currently kidney function at baseline.  He needs to follow-up with nephrology as an outpatient  Chronic normocytic anemia/iron  deficiency: Baseline Hb in the range of 8-10.  Currently hemoglobin is stable in the range of 8  Type 2 diabetes: On 70/30 insulin  at home.  Continue current insulin  regimen.  Monitor blood sugars  History of hypertension: Hypotensive on presentation.  Coreg on hold.  Currently normotensive  Debility/deconditioning/hip replacement on right/right thalamic ICH with left-sided weakness/Parkinson disease: Lives with family.  Found to be significantly weak than baseline.  PT/OT evaluation requested, commented SNF.  Continue Sinemet  for Parkinson's. TOC consulted for SNF placement         DVT prophylaxis:SCDs Start: 06/16/24 1711     Code Status: Full Code  Family Communication: Called and discussed with wife [name] on phone on 8/28  Patient status:Inpatient  Patient is from :Home  Anticipated discharge to: SnF  Estimated DC date:1-2  days.   Consultants: GI  Procedures: Colonoscopy, laparoscopic right hemicolectomy  Antimicrobials:  Anti-infectives (From admission, onward)    Start     Dose/Rate Route  Frequency Ordered Stop   06/22/24 1015  cefoTEtan  (CEFOTAN ) 2 g in sodium chloride  0.9 % 100 mL IVPB         2 g 200 mL/hr over 30 Minutes Intravenous On call to O.R. 06/22/24 1005 06/22/24 1445   06/17/24 1500  cefTRIAXone  (ROCEPHIN ) 2 g in sodium chloride  0.9 % 100 mL IVPB  Status:  Discontinued        2 g 200 mL/hr over 30 Minutes Intravenous Every 24 hours 06/16/24 1710 06/22/24 0854   06/16/24 1500  cefTRIAXone  (ROCEPHIN ) 1 g in sodium chloride  0.9 % 100 mL IVPB        1 g 200 mL/hr over 30 Minutes Intravenous  Once 06/16/24 1447 06/16/24 1548       Subjective: Patient seen and examined at bedside today.  Lying in bed.  Comfortable.  Denies any significant abdominal pain.  Says he has not passed gas yet.  Abdomen is soft, nondistended  Objective: Vitals:   06/22/24 2020 06/22/24 2332 06/23/24 0423 06/23/24 0958  BP: (!) 151/67 (!) 166/82 (!) 163/77 133/61  Pulse: 70 78 71 69  Resp: 17 20 17 16   Temp: 98.2 F (36.8 C) (!) 97.3 F (36.3 C) (!) 97.5 F (36.4 C) 97.6 F (36.4 C)  TempSrc: Oral   Oral  SpO2: 94% 97% 95% 98%  Weight:      Height:        Intake/Output Summary (Last 24 hours) at 06/23/2024 1023 Last data filed at 06/22/2024 1706 Gross per 24 hour  Intake 1280.88 ml  Output 225 ml  Net 1055.88 ml   Filed Weights   06/16/24 1931 06/22/24 1000  Weight: 78.9 kg 78.9 kg    Examination:  General exam: Overall comfortable, not in distress, chronically deconditioned elderly male HEENT: PERRL Respiratory system:  no wheezes or crackles  Cardiovascular system: S1 & S2 heard, RRR.  Gastrointestinal system: Abdomen is nondistended, soft .  Has generalized tenderness as expected, laparoscopic surgical scars, no bowel sounds heard Central nervous system: Alert and oriented Extremities: No edema, no clubbing ,no cyanosis, ulcer on the right heel Skin: No rashes,no icterus      Data Reviewed: I have personally reviewed following labs and imaging studies  CBC: Recent Labs  Lab 06/16/24 1208 06/17/24 0504 06/18/24 0513 06/19/24 0605 06/20/24 1122 06/21/24 0950  06/23/24 0459  WBC 11.7*   < > 12.5* 8.1 12.5* 8.1 9.8  NEUTROABS 8.6*  --   --   --   --  5.8  --   HGB 9.2*   < > 7.6* 7.8* 7.4* 8.1* 8.7*  HCT 30.6*   < > 24.7* 26.2* 24.2* 26.4* 28.6*  MCV 100.7*   < > 100.8* 101.9* 99.6 97.1 98.6  PLT 505*   < > 395 394 462* 415* 332   < > = values in this interval not displayed.   Basic Metabolic Panel: Recent Labs  Lab 06/16/24 1208 06/17/24 0504 06/18/24 0513 06/19/24 0605 06/23/24 0459  NA 137 138 136 144 140  K 4.7 4.3 4.5 3.6 3.9  CL 103 106 104 106 107  CO2 19* 21* 20* 22 21*  GLUCOSE 150* 109* 111* 103* 212*  BUN 43* 40* 38* 33* 18  CREATININE 2.85* 2.60* 2.48* 2.43* 2.24*  CALCIUM  8.3* 7.6* 7.6* 7.9* 7.5*     Recent Results (from the past 240 hours)  Blood culture (routine x 2)     Status: None   Collection Time: 06/16/24 12:08 PM  Specimen: BLOOD  Result Value Ref Range Status   Specimen Description   Final    BLOOD SITE NOT SPECIFIED Performed at [facility], [address] [place], [place] [address]    Special Requests   Final    BOTTLES DRAWN AEROBIC AND ANAEROBIC Blood Culture adequate volume Performed at [facility], [address] [place], [place] [address]    Culture   Final    NO GROWTH 5 DAYS Performed at [facility], [address], [place], [place] [address]    Report Status 06/21/2024 FINAL  Final  Blood culture (routine x 2)     Status: None   Collection Time: 06/16/24  3:17 PM   Specimen: BLOOD  Result Value Ref Range Status   Specimen Description   Final    BLOOD SITE NOT SPECIFIED Performed at [facility] [address], [place], [place] [address]    Special Requests   Final    BOTTLES DRAWN AEROBIC AND ANAEROBIC Blood Culture adequate volume Performed at [facility], [address] [place], [place] [address]    Culture   Final    NO GROWTH 5 DAYS Performed at [facility], [address]  [place], [place] [address]    Report Status 06/21/2024 FINAL  Final  Urine Culture     Status: Abnormal   Collection Time: 06/16/24  8:36 PM   Specimen: Urine, Clean Catch  Result Value Ref Range Status   Specimen Description   Final    URINE, CLEAN CATCH Performed at [facility], [address] [place], [place] [address]    Special Requests   Final    NONE Performed at [facility], [address] [place], [place] [address]    Culture MULTIPLE SPECIES PRESENT, SUGGEST RECOLLECTION (A)  Final   Report Status 06/17/2024 FINAL  Final  MRSA Next Gen by PCR, Nasal     Status: None   Collection Time: 06/16/24  8:36 PM   Specimen: Urine, Clean Catch; Nasal Swab  Result Value Ref Range Status   MRSA by PCR Next Gen NOT DETECTED NOT DETECTED Final    Comment: (NOTE) The GeneXpert MRSA Assay (FDA approved for NASAL specimens only), is one component of a comprehensive MRSA colonization surveillance program. It is not intended to diagnose MRSA infection nor to guide or monitor treatment for MRSA infections. Test performance is not FDA approved in patients less than 16 years old. Performed at [facility], [address], [place], [place] [address]   Urine Culture (for pregnant, neutropenic or urologic patients or patients with an indwelling urinary catheter)     Status: Abnormal   Collection Time: 06/18/24 11:21 AM   Specimen: Urine, Clean Catch  Result Value Ref Range Status   Specimen Description   Final    URINE, CLEAN CATCH Performed at [facility], [address], [place], [place] [address]    Special Requests   Final    NONE Performed at [facility], [address] [place], [place] [address]    Culture MULTIPLE SPECIES PRESENT, SUGGEST RECOLLECTION (A)  Final   Report Status 06/19/2024 FINAL  Final     Radiology Studies: CT CHEST WO CONTRAST Result Date: 06/21/2024 CLINICAL DATA:   Occult malignancy. EXAM: CT CHEST WITHOUT CONTRAST TECHNIQUE: Multidetector CT imaging of the chest was performed following the standard protocol without IV contrast. RADIATION  DOSE REDUCTION: This exam was performed according to the departmental dose-optimization program which includes automated exposure control, adjustment of the mA and/or kV according to patient size and/or use of iterative reconstruction technique. COMPARISON:  06/16/2024, 01/25/2019. FINDINGS: Cardiovascular: The heart is normal in size and there is a trace pericardial effusion. Multi-vessel coronary artery calcifications are noted. There is atherosclerotic calcification of the aorta without evidence of aneurysm. The pulmonary trunk is normal in caliber. Mediastinum/Nodes: There is a prominent subcarinal lymph node measuring 1 cm. No axillary lymphadenopathy. Evaluation of the hila is limited due to lack of IV contrast. The thyroid gland, trachea, and esophagus are within normal limits. Lungs/Pleura: There is a small pleural effusion on the left and trace pleural effusion on the right. Consolidation is noted in the lower lobes bilaterally, greater on the left than on the right. Atelectasis is noted in the lingular segment of the left upper lobe. No pneumothorax is seen. There is a 3 mm nodule in the right upper lobe, axial image 21. Upper Abdomen: Scattered hypodensities are noted in the liver, likely cysts. There is a cyst in the mid left kidney. No acute abnormality. Gynecomastia is present bilaterally. Musculoskeletal: Degenerative changes are present in the thoracic spine and bilateral shoulders. No acute or suspicious osseous abnormality is seen. IMPRESSION: 1. No focal mass is seen. 2. Bilateral lower lobe consolidation, possible atelectasis or infiltrate. Follow-up is recommended until resolution to exclude the possibility of underlying malignancy. 3. Small bilateral pleural effusions. 4. Coronary artery calcifications. 5. Aortic  atherosclerosis. Electronically Signed   By: Leita Birmingham M.D.   On: 06/21/2024 16:31    Scheduled Meds:  acetaminophen   1,000 mg Oral Q6H   carbidopa -levodopa   1 tablet Oral BID   cholecalciferol   1,000 Units Oral Daily   escitalopram   10 mg Oral Daily   folic acid   1 mg Oral Daily   Gerhardt's butt cream   Topical TID   insulin  aspart  0-5 Units Subcutaneous QHS   insulin  aspart  0-6 Units Subcutaneous TID WC   leptospermum manuka honey  1 Application Topical Daily   methocarbamol   500 mg Oral TID   multivitamin with minerals  1 tablet Oral Daily   pramipexole   0.25 mg Oral QHS   rosuvastatin   10 mg Oral QHS   sodium bicarbonate   650 mg Oral TID   tamsulosin   0.4 mg Oral Daily   Continuous Infusions:  dextrose  5 % and 0.45 % NaCl 75 mL/hr at 06/22/24 1849     LOS: 7 days   Ivonne Mustache, MD [facility] P9/12/2023, 10:23 AM

## 2024-06-24 ENCOUNTER — Other Ambulatory Visit: Payer: Self-pay | Admitting: *Deleted

## 2024-06-24 ENCOUNTER — Encounter: Payer: Self-pay | Admitting: *Deleted

## 2024-06-24 DIAGNOSIS — R6520 Severe sepsis without septic shock: Secondary | ICD-10-CM | POA: Diagnosis not present

## 2024-06-24 DIAGNOSIS — C180 Malignant neoplasm of cecum: Secondary | ICD-10-CM

## 2024-06-24 DIAGNOSIS — A419 Sepsis, unspecified organism: Secondary | ICD-10-CM | POA: Diagnosis not present

## 2024-06-24 LAB — GLUCOSE, CAPILLARY
Glucose-Capillary: 137 mg/dL — ABNORMAL HIGH (ref 70–99)
Glucose-Capillary: 189 mg/dL — ABNORMAL HIGH (ref 70–99)
Glucose-Capillary: 149 mg/dL — ABNORMAL HIGH (ref 70–99)
Glucose-Capillary: 134 mg/dL — ABNORMAL HIGH (ref 70–99)

## 2024-06-24 MED ORDER — HEPARIN SODIUM (PORCINE) 5000 UNIT/ML IJ SOLN
5000.0000 [IU] | Freq: Three times a day (TID) | INTRAMUSCULAR | Status: DC
  Administered 2024-06-24 – 2024-06-25 (×3): 5000 [IU] via SUBCUTANEOUS
  Filled 2024-06-24 (×4): qty 1

## 2024-06-24 NOTE — Plan of Care (Signed)
  Problem: Education: Goal: Ability to describe self-care measures that may prevent or decrease complications (Diabetes Survival Skills Education) will improve Outcome: Progressing   Problem: Coping: Goal: Ability to adjust to condition or change in health will improve 06/24/2024 2306 by Ellie Kirstie HERO, RN Outcome: Progressing 06/24/2024 2213 by Ellie Kirstie HERO, RN Outcome: Progressing   Problem: Fluid Volume: Goal: Ability to maintain a balanced intake and output will improve 06/24/2024 2306 by Ellie Kirstie HERO, RN Outcome: Progressing 06/24/2024 2213 by Ellie Kirstie HERO, RN Outcome: Progressing

## 2024-06-24 NOTE — Progress Notes (Addendum)
 2 Days Post-Op  Subjective: CC: He reports abdominal pain is well controlled. Tolerating cld without n/v. No flatus or BM.  Foley in place - RN investigating output as no UOP documented over the last 12 hours, there is 250cc straw colored urine in foley bag currently. Has not been oob. He walks with a walker at baseline and lives with his wife.   Afebrile. WBC wnl. Hgb stable. Cr stable.   Objective: Vital signs in last 24 hours: Temp:  [97.4 F (36.3 C)-97.6 F (36.4 C)] 97.5 F (36.4 C) (09/04 0455) Pulse Rate:  [59-72] 72 (09/04 0455) Resp:  [14-19] 18 (09/04 0455) BP: (113-153)/(56-76) 153/76 (09/04 0455) SpO2:  [97 %-100 %] 100 % (09/04 0455) Last BM Date : 06/22/24 (per patient)  Intake/Output from previous day: 09/03 0701 - 09/04 0700 In: 690 [P.O.:690] Out: 810 [Urine:810] Intake/Output this shift: Total I/O In: 120 [P.O.:120] Out: -   PE: Gen:  Alert, NAD, pleasant Abd: Soft, mild distension, generalized ttp that is most around his incisions, no rigidity or guarding. Midline and laparoscopic incisions with dermabond in place, cdi.  GU: Foley in place with straw colored urine in foley bag - 250cc currently.   Lab Results:  Recent Labs    06/21/24 0950 06/23/24 0459  WBC 8.1 9.8  HGB 8.1* 8.7*  HCT 26.4* 28.6*  PLT 415* 332   BMET Recent Labs    06/23/24 0459  NA 140  K 3.9  CL 107  CO2 21*  GLUCOSE 212*  BUN 18  CREATININE 2.24*  CALCIUM  7.5*   PT/INR No results for input(s): LABPROT, INR in the last 72 hours. CMP     Component Value Date/Time   NA 140 06/23/2024 0459   K 3.9 06/23/2024 0459   CL 107 06/23/2024 0459   CO2 21 (L) 06/23/2024 0459   GLUCOSE 212 (H) 06/23/2024 0459   BUN 18 06/23/2024 0459   CREATININE 2.24 (H) 06/23/2024 0459   CALCIUM  7.5 (L) 06/23/2024 0459   PROT 6.4 (L) 06/16/2024 1208   ALBUMIN  2.7 (L) 06/16/2024 1208   AST 25 06/16/2024 1208   ALT <5 06/16/2024 1208   ALKPHOS 158 (H) 06/16/2024 1208    BILITOT 0.4 06/16/2024 1208   GFRNONAA 28 (L) 06/23/2024 0459   Lipase     Component Value Date/Time   LIPASE 37 06/16/2024 1208    Studies/Results: No results found.   Anti-infectives: Anti-infectives (From admission, onward)    Start     Dose/Rate Route Frequency Ordered Stop   06/22/24 1015  cefoTEtan  (CEFOTAN ) 2 g in sodium chloride  0.9 % 100 mL IVPB        2 g 200 mL/hr over 30 Minutes Intravenous On call to O.R. 06/22/24 1005 06/22/24 1445   06/17/24 1500  cefTRIAXone  (ROCEPHIN ) 2 g in sodium chloride  0.9 % 100 mL IVPB  Status:  Discontinued        2 g 200 mL/hr over 30 Minutes Intravenous Every 24 hours 06/16/24 1710 06/22/24 0854   06/16/24 1500  cefTRIAXone  (ROCEPHIN ) 1 g in sodium chloride  0.9 % 100 mL IVPB        1 g 200 mL/hr over 30 Minutes Intravenous  Once 06/16/24 1447 06/16/24 1548        Assessment/Plan POD 2 s/p laparoscopic right hemicolectomy by Dr. 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  CT chest  shows no evident metastatic disease in the lungs  -Diet as tolerated - Mobilize, PT  FEN -diet as tolerated; IVF per TRH VTE - SCDs, okay for chem ppx from a general surgery standpoint  ID - None currently.  Foley- per primary   LOS: 8 days    Lonni CHRISTELLA Pizza, MD [facility] 06/24/2024, 9:19 AM Please see Amion for pager number during day hours 7:00am-4:30pm

## 2024-06-24 NOTE — Plan of Care (Signed)
   Problem: Coping: Goal: Ability to adjust to condition or change in health will improve Outcome: Progressing   Problem: Fluid Volume: Goal: Ability to maintain a balanced intake and output will improve Outcome: Progressing

## 2024-06-24 NOTE — Progress Notes (Signed)
 OT Cancellation Note  Patient Details Name: Jake Bartlett MRN: [id number] DOB: 1941-01-14   Cancelled Treatment:    Reason Eval/Treat Not Completed: Patient declined. He stated he is too fatigued to work with therapy. He also stated he doubts he will feel up for working with therapy tomorrow.    [name], OTR/L 06/24/2024, 1:49 PM

## 2024-06-24 NOTE — Progress Notes (Signed)
 PROGRESS NOTE  Jake Bartlett  FMW:[id number] DOB: 1941-10-04 DOA: 06/16/2024 PCP: Alyse Bradley, MD   Brief Narrative: Patient is a 83 year old male with history of CKD stage IV with baseline creatinine around 2.8, chronic normocytic anemia with baseline hemoglobin around 8-10, type 2 diabetes, hypertension, right thalamic ICH with left-sided weakness, Parkinson disease, recent right total hip replacement revision who was sent to the emergency department for the evaluation of weakness, concern for infection.  On presentation, he was hypotensive.  Lab work showed creatinine of 2.8, lactate of 2.7, WBC count of 9.7.  UA was suspicious for UTI, started on ceftriaxone .  X-ray of the right ankle showed soft tissue defect/wound overlying the posterior calcaneus without evidence of osteomyelitis, wound care consulted.  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  General surgery consulted.  Status post laparoscopic right hemicolectomy on 9/2.  Pathology from colonoscopy showed adenocarcinoma.  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  GI consulted. 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  General surgery consulted.  Status post laparoscopic right hemicolectomy on 9/2.  CEA normal, could be an error so repeated.  Pathology from colonoscopy confirmed adenocarcinoma.  Pathology from surgery still  pending.  Currently on regular diet.  No bowel movement yet.  Denies passing any gas.   He needs to follow-up with oncology as an outpatient.  Will send message  to oncology for follow-up arrangement.CT chest did not show any metastatic disease. CT also showed slight thickened and irregular appearance of the left lateral bladder wall. A urothelial neoplasm is not excluded.  Recommend further evaluation with cystoscopy.  We recommend to follow-up with urology as an outpatient.  Severe sepsis secondary to UTI: Presented with hypotension, elevated lactate.  UA suspicious for UTI.  Started on ceftriaxone , completed 5 days course. Currently hemodynamically stable with a stable blood pressure.  No leukocytosis or fever  Right heel wound: Right ankle X-ray did not show any osteomyelitis.  Wound care consulted.  Patient needs to follow-up with his wound care clinic  CKD stage IV/metabolic acidosis: Given  gentle iv fluid. Added sodium bicarb tabs.  Baseline creatinine around 2.8.  Currently kidney function at baseline.  He needs to follow-up with nephrology as an outpatient  Chronic normocytic anemia/iron  deficiency: Baseline Hb in the range of 8-10.  Currently hemoglobin is stable in the range of 8  Type 2 diabetes: On 70/30 insulin  at home.  Continue current insulin  regimen.  Monitor blood sugars  History of hypertension: Hypotensive on presentation.  Coreg on hold.  We will continue monitoring  Debility/deconditioning/hip replacement on right/right thalamic ICH with left-sided weakness/Parkinson disease: Lives with family.  Found to be significantly weak than baseline.  PT/OT evaluation requested, recommended SNF.  Continue Sinemet  for Parkinson's. TOC consulted for SNF placement         DVT prophylaxis:heparin  injection 5,000 Units Start: 06/24/24 1400 SCDs Start: 06/16/24 1711     Code Status: Full Code  Family Communication: Called and discussed with wife [name] on phone on 9/4  Patient status:Inpatient  Patient is from :Home  Anticipated discharge to: SnF  Estimated DC date:1-2  days.   Consultants: GI  Procedures: Colonoscopy,  laparoscopic right hemicolectomy  Antimicrobials:  Anti-infectives (From admission, onward)    Start     Dose/Rate Route Frequency Ordered Stop   06/22/24 1015  cefoTEtan  (CEFOTAN ) 2 g in sodium chloride  0.9 % 100 mL IVPB        2 g 200 mL/hr over 30 Minutes Intravenous On call to O.R. 06/22/24 1005 06/22/24 1445   06/17/24 1500  cefTRIAXone  (ROCEPHIN ) 2 g in sodium chloride  0.9 % 100 mL IVPB  Status:  Discontinued        2 g 200 mL/hr over 30 Minutes Intravenous Every 24 hours 06/16/24 1710 06/22/24 0854   06/16/24 1500  cefTRIAXone  (ROCEPHIN ) 1 g in sodium chloride  0.9 % 100 mL IVPB        1 g 200 mL/hr over 30 Minutes Intravenous  Once 06/16/24 1447 06/16/24 1548       Subjective: Patient seen and examined at bedside today.  Hemodynamically stable.  Lying in bed.  Overall appears comfortable.  Declined passing any gas.  No bowel movement yet.  Tolerating solid diet.  Objective: Vitals:   06/23/24 1346 06/23/24 1752 06/23/24 2120 06/24/24 0455  BP: (!) 113/56 129/60 (!) 140/60 (!) 153/76  Pulse: 70 63 (!) 59 72  Resp: 14 18 19 18   Temp: (!) 97.5 F (36.4 C) (!) 97.4 F (36.3 C) 97.6 F (36.4 C) (!) 97.5 F (36.4 C)  TempSrc: Oral Oral Oral Oral  SpO2: 97% 99% 98% 100%  Weight:      Height:        Intake/Output Summary (Last 24 hours) at 06/24/2024 1127 Last data filed at 06/24/2024 0804 Gross per 24 hour  Intake 560 ml  Output 810 ml  Net -250 ml   Filed Weights   06/16/24 1931 06/22/24 1000  Weight: 78.9 kg 78.9 kg    Examination:  General exam: Overall comfortable, not in distress, chronically deconditioned, weak HEENT: PERRL Respiratory system:  no wheezes or crackles  Cardiovascular system: S1 & S2 heard, RRR.  Gastrointestinal system: Abdomen is nondistended, soft , appropriately tender.  Surgical scars.  No bowel sounds heard Central nervous system: Alert and oriented Extremities: No edema, no clubbing ,no cyanosis, ulcer on the right heel Skin: No  rashes, no ulcers,no icterus        Data Reviewed: I have personally reviewed following labs and imaging studies  CBC: Recent Labs  Lab 06/18/24 0513 06/19/24 0605 06/20/24 1122 06/21/24 0950 06/23/24 0459  WBC 12.5* 8.1 12.5* 8.1 9.8  NEUTROABS  --   --   --  5.8  --   HGB 7.6* 7.8* 7.4* 8.1* 8.7*  HCT 24.7* 26.2* 24.2* 26.4* 28.6*  MCV 100.8* 101.9* 99.6 97.1 98.6  PLT 395 394 462* 415* 332   Basic Metabolic Panel: Recent Labs  Lab 06/18/24 0513 06/19/24 0605 06/23/24 0459  NA 136 144 140  K 4.5 3.6 3.9  CL 104 106 107  CO2 20* 22 21*  GLUCOSE 111* 103* 212*  BUN 38* 33* 18  CREATININE 2.48* 2.43* 2.24*  CALCIUM  7.6* 7.9* 7.5*     Recent Results (from the past 240 hours)  Blood culture (routine x 2)     Status: None   Collection Time: 06/16/24 12:08 PM   Specimen: BLOOD  Result Value Ref Range Status   Specimen Description   Final    BLOOD SITE NOT SPECIFIED Performed at [facility], [address] [place], [place] [address]    Special Requests   Final    BOTTLES DRAWN AEROBIC AND ANAEROBIC Blood Culture adequate volume Performed at [facility], [address] [place], [place] [address]    Culture   Final    NO GROWTH 5 DAYS Performed at [facility], [address] [place], [place] [address]    Report Status 06/21/2024 FINAL  Final  Blood culture (routine x 2)     Status: None   Collection Time: 06/16/24  3:17 PM   Specimen: BLOOD  Result Value Ref Range Status   Specimen Description   Final    BLOOD SITE NOT SPECIFIED Performed at [facility], [address] [place], [place] [address]    Special Requests   Final    BOTTLES DRAWN AEROBIC AND ANAEROBIC Blood Culture adequate volume Performed at [facility], [address], [place], [place] [address]    Culture   Final    NO GROWTH 5 DAYS Performed at [facility], [address], [place], [place]  [address]    Report Status 06/21/2024 FINAL  Final  Urine Culture     Status: Abnormal   Collection Time: 06/16/24  8:36 PM   Specimen: Urine, Clean Catch  Result Value Ref Range Status   Specimen Description   Final    URINE, CLEAN CATCH Performed at [facility], [address], [place], [place] [address]    Special Requests   Final    NONE Performed at [facility], [address], [place], [place] [address]    Culture MULTIPLE SPECIES PRESENT, SUGGEST RECOLLECTION (A)  Final   Report Status 06/17/2024 FINAL  Final  MRSA Next Gen by PCR, Nasal     Status: None   Collection Time: 06/16/24  8:36 PM   Specimen: Urine, Clean Catch; Nasal Swab  Result Value Ref Range Status   MRSA by PCR Next Gen NOT DETECTED NOT DETECTED Final    Comment: (NOTE) The GeneXpert MRSA Assay (FDA approved for NASAL specimens only), is one component of a comprehensive MRSA colonization surveillance program. It is not intended to diagnose MRSA infection nor to guide or monitor treatment for MRSA infections. Test performance is not FDA approved in patients less than 74 years old. Performed at [facility], [address], [place], [place] [address]   Urine Culture (for pregnant, neutropenic or urologic patients or patients with an indwelling urinary catheter)     Status: Abnormal   Collection Time: 06/18/24 11:21 AM   Specimen: Urine, Clean Catch  Result Value Ref Range Status   Specimen Description   Final    URINE, CLEAN CATCH Performed at [facility], [address] [place], [place] [address]    Special Requests   Final    NONE Performed at [facility], [address] [place], [place] [address]    Culture MULTIPLE SPECIES PRESENT, SUGGEST RECOLLECTION (A)  Final   Report Status 06/19/2024 FINAL  Final     Radiology Studies: No results found.   Scheduled Meds:  acetaminophen   1,000 mg Oral Q6H    carbidopa -levodopa   1 tablet Oral BID   Chlorhexidine  Gluconate Cloth  6 each Topical Daily   cholecalciferol   1,000 Units Oral Daily   escitalopram   10 mg Oral Daily   feeding supplement  237 mL Oral BID BM   folic acid   1 mg Oral Daily   Gerhardt's butt cream   Topical TID   heparin  injection (subcutaneous)  5,000 Units Subcutaneous Q8H   insulin  aspart  0-5 Units Subcutaneous QHS   insulin  aspart  0-6 Units Subcutaneous TID WC   leptospermum manuka honey  1 Application Topical Daily   methocarbamol   500 mg Oral TID   multivitamin with minerals  1 tablet Oral Daily   pramipexole   0.25 mg Oral QHS   rosuvastatin   10 mg Oral QHS   sodium bicarbonate   650 mg Oral TID   tamsulosin   0.4 mg Oral Daily   Continuous Infusions:     LOS: 8 days   Ivonne Mustache, MD [facility] P9/01/2024, 11:27 AM

## 2024-06-24 NOTE — Plan of Care (Signed)
  Problem: Fluid Volume: Goal: Ability to maintain a balanced intake and output will improve Outcome: Progressing   Problem: Nutritional: Goal: Maintenance of adequate nutrition will improve Outcome: Progressing   Problem: Skin Integrity: Goal: Risk for impaired skin integrity will decrease Outcome: Progressing   Problem: Nutrition: Goal: Adequate nutrition will be maintained Outcome: Progressing   Problem: Coping: Goal: Ability to adjust to condition or change in health will improve Outcome: Not Progressing   Problem: Activity: Goal: Risk for activity intolerance will decrease Outcome: Not Progressing

## 2024-06-24 NOTE — Progress Notes (Signed)
 Physical Therapy Treatment Patient Details Name: Jake Bartlett MRN: [id number] DOB: [birth year] Today's Date: 06/24/2024   History of Present Illness 83 year old male was sent from PCP office due to concern for infection and weakness and admitted for Severe sepsis secondary to UTI. Pt s/p laparoscopic right hemicolectomy by Dr. 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 PMHx: R ankle wound (from traction awaiting R hip surgery in June), cecal mass, diverticulosis, UTI, Past medical history of CKD metabolic acidosis baseline, chronic anemia hemoglobin baseline around 8-10 gm, type 2 diabetes, hypertension, and history of right thalamic ICH with left-sided weakness, Parkinson disease, recent right hip total replacement revision and staple removal on 04/15/2024    PT Comments  Pt assisted with bed mobility and transfer OOB to recliner.  Pt continues to require +2 assist however increased assist today compared to previous session with this therapist.  D/C plan remains appropriate as spouse would not be able to physically assist pt at home.  Patient will benefit from continued inpatient follow up therapy, <3 hours/day.      If plan is discharge home, recommend the following: Assistance with cooking/housework;Help with stairs or ramp for entrance;Assist for transportation;A lot of help with walking and/or transfers;A lot of help with bathing/dressing/bathroom   Can travel by private vehicle     No  Equipment Recommendations  None recommended by PT    Recommendations for Other Services       Precautions / Restrictions Precautions Precautions: Fall     Mobility  Bed Mobility Overal bed mobility: Needs Assistance Bed Mobility: Supine to Sit     Supine to sit: Mod assist, Max assist, +2 for physical assistance     General bed mobility comments: declined log roll technique, assist for upper and lower body due to  recent abdominal surgery    Transfers Overall transfer level: Needs assistance Equipment used: Rolling walker (2 wheels) Transfers: Sit to/from Stand Sit to Stand: Mod assist, From elevated surface, +2 physical assistance   Step pivot transfers: Min assist, +2 safety/equipment       General transfer comment: verbal cues for positioning and technique; pt continues to report some fear/anxiety with mobility so only agreeable for OOB to recliner at this time (however has not been OOB since Sunday per pt and spouse)    Ambulation/Gait                   Stairs             Wheelchair Mobility     Tilt Bed    Modified Rankin (Stroke Patients Only)       Balance Overall balance assessment: Needs assistance Sitting-balance support: No upper extremity supported, Feet supported Sitting balance-Leahy Scale: Fair     Standing balance support: Bilateral upper extremity supported, During functional activity, Reliant on assistive device for balance Standing balance-Leahy Scale: Poor                              Communication Communication Communication: Impaired Factors Affecting Communication: Hearing impaired  Cognition Arousal: Alert Behavior During Therapy: WFL for tasks assessed/performed   PT - Cognitive impairments: No apparent impairments                       PT - Cognition Comments: reports some anxiety and fear in regards to mobility Following commands: Intact  Cueing Cueing Techniques: Verbal cues  Exercises      General Comments        Pertinent Vitals/Pain Pain Assessment Pain Assessment: Faces Faces Pain Scale: Hurts even more Pain Location: neck and shoulders Pain Descriptors / Indicators: Sore, Aching Pain Intervention(s): Repositioned, Monitored during session, Patient requesting pain meds-RN notified    Home Living                          Prior Function            PT Goals (current goals  can now be found in the care plan section) Progress towards PT goals: Progressing toward goals    Frequency    Min 3X/week      PT Plan      Co-evaluation              AM-PAC PT 6 Clicks Mobility   Outcome Measure  Help needed turning from your back to your side while in a flat bed without using bedrails?: A Lot Help needed moving from lying on your back to sitting on the side of a flat bed without using bedrails?: Total Help needed moving to and from a bed to a chair (including a wheelchair)?: Total Help needed standing up from a chair using your arms (e.g., wheelchair or bedside chair)?: Total Help needed to walk in hospital room?: Total Help needed climbing 3-5 steps with a railing? : Total 6 Click Score: 7    End of Session Equipment Utilized During Treatment: Gait belt Activity Tolerance: Patient limited by fatigue;Patient limited by pain Patient left: in chair;with chair alarm set;with call bell/phone within reach;with family/visitor present Nurse Communication: Mobility status;Patient requests pain meds PT Visit Diagnosis: Muscle weakness (generalized) (M62.81);Pain;Unsteadiness on feet (R26.81) Pt in recliner sitting on 2 pillows and pillows for each LE elevated in recliner.  Also applied mepilex foam dressing to 2 red areas along pt's thoracic spinous processes which were observed once pt sitting EOB and RN notified.     Time: 8550-8493 PT Time Calculation (min) (ACUTE ONLY): 17 min  Charges:    $Therapeutic Activity: 8-22 mins PT General Charges $$ ACUTE PT VISIT: 1 Visit                     Tari PT, DPT Physical Therapist Acute Rehabilitation Services Office: [phone]    Tari CROME Payson 06/24/2024, 4:37 PM

## 2024-06-24 NOTE — TOC Progression Note (Signed)
 Transition of Care [facility]) - Progression Note    Patient Details  Name: Sebert Stollings MRN: [id number] Date of Birth: [birth year]  [facility]) CM/SW Contact  Venson Ferencz, Nathanel, RN Phone Number: 06/24/2024, 11:31 AM  Clinical Narrative: Noted awaiting final path results. [facility] SN[facility] rep [facility] accepted-awaiting d/c.       Expected Discharge Plan: Skilled Nursing Facility Barriers to Discharge: Continued Medical Work up               Expected Discharge Plan and Services     Post Acute Care Choice: Skilled Nursing Facility Living arrangements for the past 2 months: Single Family Home                                       Social Drivers of Health (SDOH) Interventions SDOH Screenings   Food Insecurity: No Food Insecurity (06/16/2024)  Housing: Low Risk  (06/16/2024)  Transportation Needs: No Transportation Needs (06/16/2024)  Utilities: Not At Risk (06/16/2024)  Financial Resource Strain: Low Risk  (12/05/2023)   Received from [facility]  Physical Activity: Insufficiently Active (06/24/2023)   Received from [facility]  Social Connections: Moderately Integrated (06/16/2024)  Stress: No Stress Concern Present (06/24/2023)   Received from [facility]  Tobacco Use: Low Risk  (06/22/2024)    Readmission Risk Interventions    06/19/2024    4:28 PM  Readmission Risk Prevention Plan  Post Dischage Appt Complete  Medication Screening Complete  Transportation Screening Complete

## 2024-06-24 NOTE — Progress Notes (Signed)
 Pt refused dressing change. Explained the importance of wound dressing but still declined. Patient stated it's not worth the pain. Patient also refused to be repositioned. RN tried to explain the need to turn to help redistribute the pressure and help prevent pressure ulcer but pt refused, won't take pain meds either. Will continue to monitor.

## 2024-06-24 NOTE — Progress Notes (Signed)
 Oncology Discharge Planning Note  [facility] Address: [address], [place], [place] [address] Hours of Operation:  [place], Monday - Friday  Clinic Contact Information:  [phone]  Oncology Care Team: Medical Oncologist:  Cloretta  Patient Details: Name:  Jake Bartlett, Jake Bartlett MRN:   [id number] DOB:   [birth year] Reason for Current Admission: @PPROB @  Discharge Planning Narrative: Notification of admission received by inpatient team for [facility].  Discharge follow-up appointments for oncology are current and available on the AVS and MyChart.   Upon discharge from the hospital, hematology/oncology's post discharge plan of care for the outpatient setting is:  Thursday, September 25,2025 at 1:40pm with Dr Arley Cloretta  [facility] [address] [place], [place] [address]  [phone]   [place] will be called within two business days after discharge to review hematology/oncology's plan of care for full understanding.    Outpatient Oncology Specific Care Only: Oncology appointment transportation needs addressed?:  no Oncology medication management for symptom management addressed?:  not applicable Chemo Alert Card reviewed?:  not applicable Immunotherapy Alert Card reviewed?:  not applicable

## 2024-06-25 DIAGNOSIS — R6520 Severe sepsis without septic shock: Secondary | ICD-10-CM | POA: Diagnosis not present

## 2024-06-25 DIAGNOSIS — A419 Sepsis, unspecified organism: Secondary | ICD-10-CM | POA: Diagnosis not present

## 2024-06-25 LAB — CEA: CEA: 4.4 ng/mL (ref 0.0–4.7)

## 2024-06-25 LAB — BASIC METABOLIC PANEL WITH GFR
Anion gap: 11 (ref 5–15)
BUN: 24 mg/dL — ABNORMAL HIGH (ref 8–23)
CO2: 22 mmol/L (ref 22–32)
Calcium: 7.9 mg/dL — ABNORMAL LOW (ref 8.9–10.3)
Chloride: 109 mmol/L (ref 98–111)
Creatinine, Ser: 2.35 mg/dL — ABNORMAL HIGH (ref 0.61–1.24)
GFR, Estimated: 27 mL/min — ABNORMAL LOW (ref >60)
Glucose, Bld: 123 mg/dL — ABNORMAL HIGH (ref 70–99)
Potassium: 4.4 mmol/L (ref 3.5–5.1)
Sodium: 142 mmol/L (ref 135–145)

## 2024-06-25 LAB — CBC
HCT: 29.3 % — ABNORMAL LOW (ref 39.0–52.0)
Hemoglobin: 8.9 g/dL — ABNORMAL LOW (ref 13.0–17.0)
MCH: 30.8 pg (ref 26.0–34.0)
MCHC: 30.4 g/dL (ref 30.0–36.0)
MCV: 101.4 fL — ABNORMAL HIGH (ref 80.0–100.0)
Platelets: 367 K/uL (ref 150–400)
RBC: 2.89 MIL/uL — ABNORMAL LOW (ref 4.22–5.81)
RDW: 17.6 % — ABNORMAL HIGH (ref 11.5–15.5)
WBC: 13.5 K/uL — ABNORMAL HIGH (ref 4.0–10.5)
nRBC: 0 % (ref 0.0–0.2)

## 2024-06-25 LAB — GLUCOSE, CAPILLARY
Glucose-Capillary: 141 mg/dL — ABNORMAL HIGH (ref 70–99)
Glucose-Capillary: 287 mg/dL — ABNORMAL HIGH (ref 70–99)

## 2024-06-25 LAB — SURGICAL PATHOLOGY

## 2024-06-25 MED ORDER — GERHARDT'S BUTT CREAM: 1.0000 | TOPICAL_CREAM | Freq: Three times a day (TID) | CUTANEOUS | Status: DC

## 2024-06-25 MED ORDER — OXYCODONE HCL 5 MG PO TABS: 5.0000 mg | ORAL_TABLET | ORAL | 0 refills | Status: DC | PRN

## 2024-06-25 MED ORDER — ENSURE PLUS HIGH PROTEIN PO LIQD: 237.0000 mL | Freq: Two times a day (BID) | ORAL | Status: DC

## 2024-06-25 MED ORDER — SODIUM BICARBONATE 650 MG PO TABS: 650.0000 mg | ORAL_TABLET | Freq: Three times a day (TID) | ORAL | Status: DC

## 2024-06-25 NOTE — Discharge Instructions (Signed)
 [facility]      [facility], [place] [phone]  OPEN ABDOMINAL SURGERY: POST OP INSTRUCTIONS  Always review your discharge instruction sheet given to you by the facility where your surgery was performed.  IF YOU HAVE DISABILITY OR FAMILY LEAVE FORMS, YOU MUST BRING THEM TO THE OFFICE FOR PROCESSING.  PLEASE DO NOT GIVE THEM TO YOUR DOCTOR.  A prescription for pain medication may be given to you upon discharge.  Take your pain medication as prescribed, if needed.  If narcotic pain medicine is not needed, then you may take acetaminophen  (Tylenol ) or ibuprofen (Advil) as needed. Take your usually prescribed medications unless otherwise directed. If you need a refill on your pain medication, please contact your pharmacy. They will contact our office to request authorization.  Prescriptions will not be filled after 5pm or on week-ends. You should follow a light diet the first few days after arrival home, such as soup and crackers, pudding, etc.unless your doctor has advised otherwise. A high-fiber, low fat diet can be resumed as tolerated.   Be sure to include lots of fluids daily. Most patients will experience some swelling and bruising on the chest and neck area.  Ice packs will help.  Swelling and bruising can take several days to resolve Most patients will experience some swelling and bruising in the area of the incision. Ice pack will help. Swelling and bruising can take several days to resolve..  It is common to experience some constipation if taking pain medication after surgery.  Increasing fluid intake and taking a stool softener will usually help or prevent this problem from occurring.  A mild laxative (Milk of Magnesia or Miralax) should be taken according to package directions if there are no bowel movements after 48 hours.  You may have steri-strips (small skin tapes) in place directly over the incision.  These strips should be left on the skin for 7-10 days.  If your surgeon used skin  glue on the incision, you may shower in 24 hours.  The glue will flake off over the next 2-3 weeks.  Any sutures or staples will be removed at the office during your follow-up visit. You may find that a light gauze bandage over your incision may keep your staples from being rubbed or pulled. You may shower and replace the bandage daily. ACTIVITIES:  You may resume regular (light) daily activities beginning the next day--such as daily self-care, walking, climbing stairs--gradually increasing activities as tolerated.  You may have sexual intercourse when it is comfortable.  Refrain from any heavy lifting or straining until approved by your doctor. You may drive when you no longer are taking prescription pain medication, you can comfortably wear a seatbelt, and you can safely maneuver your car and apply brakes Return to Work: ___________________________________ Jake Bartlett should see your doctor in the office for a follow-up appointment approximately two weeks after your surgery.  Make sure that you call for this appointment within a day or two after you arrive home to insure a convenient appointment time. OTHER INSTRUCTIONS:  _____________________________________________________________ _____________________________________________________________  WHEN TO CALL YOUR DOCTOR: Fever over 101.0 Inability to urinate Nausea and/or vomiting Extreme swelling or bruising Continued bleeding from incision. Increased pain, redness, or drainage from the incision. Difficulty swallowing or breathing Muscle cramping or spasms. Numbness or tingling in hands or feet or around lips.  The clinic staff is available to answer your questions during regular business hours.  Please don't hesitate to call and ask to speak to one of  the nurses if you have concerns.  For further questions, please visit [email or web address]

## 2024-06-25 NOTE — Discharge Summary (Signed)
 Physician Discharge Summary  Jake Bartlett FMW:[id number] DOB: [birth year] DOA: 06/16/2024  PCP: Alyse Bradley, MD  Admit date: 06/16/2024 Discharge date: 06/25/2024  Admitted From: Home Disposition:  SNF  Discharge Condition:Stable CODE STATUS:FULL Diet recommendation: Regular  Brief/Interim Summary: Patient is a 83 year old male with history of CKD stage IV with baseline creatinine around 2.8, chronic normocytic anemia with baseline hemoglobin around 8-10, type 2 diabetes, hypertension, right thalamic ICH with left-sided weakness, Parkinson disease, recent right total hip replacement revision who was sent to the emergency department for the evaluation of weakness, concern for infection.  On presentation, he was hypotensive.  Lab work showed creatinine of 2.8, lactate of 2.7, WBC count of 9.7.  UA was suspicious for UTI, started on ceftriaxone .  X-ray of the right ankle showed soft tissue defect/wound overlying the posterior calcaneus without evidence of osteomyelitis, wound care consulted.  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  General surgery consulted.  Status post laparoscopic right hemicolectomy on 9/2.  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  Oncology has already been made aware and he has outpatient follow-up arranged.  He finally had a bowel movement.  General surgery cleared for discharge.  Medically stable for discharge today to SNF  Following problems were addressed during the hospitalization:  Cecal mass/probable urothelial mass: CT abdomen/pelvis showed significant increase in the cecal mass consistent with colonic malignancy.  GI consulted. 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  General surgery consulted.  Status post laparoscopic right hemicolectomy on 9/2.  CEA normal, could be an  error so repeated.  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 CT chest did not show any metastatic disease.  Oncology will arrange outpatient follow-up for treatment of colon cancer.  He started having bowel movements.  Tolerating solid diet CT also showed slight thickened and irregular appearance of the left lateral bladder wall. A urothelial neoplasm is not excluded.  Recommend further evaluation with cystoscopy.  We recommend to follow-up with urology as an outpatient.   Severe sepsis secondary to UTI: Presented with hypotension, elevated lactate.  UA suspicious for UTI.  Started on ceftriaxone , completed 5 days course. Currently hemodynamically stable with a stable blood pressure.  No leukocytosis or fever   Right heel wound: Right ankle X-ray did not show any osteomyelitis.  Wound care consulted.  Patient needs to follow-up with his wound care clinic   CKD stage IV/metabolic acidosis: Given  gentle iv fluid. Added sodium bicarb tabs.  Baseline creatinine around 2.8.  Currently kidney function at baseline.  He needs to follow-up with nephrology as an outpatient   Chronic normocytic anemia/iron  deficiency: Baseline Hb in the range of 8-10.  Currently hemoglobin is stable in the range of 8   Type 2 diabetes: On 70/30 insulin  at home.  Continue current insulin  regimen.  Monitor blood sugars   History of hypertension: On Coreg  Debility/deconditioning/hip replacement on right/right thalamic ICH with left-sided weakness/Parkinson disease: Lives with family.  Found to be significantly weak than baseline.  PT/OT evaluation requested, recommended SNF.  Continue Sinemet  for Parkinson's. TOC consulted for SNF placement   Discharge Diagnoses:  Principal Problem:   Severe sepsis (HCC) Active Problems:   Sepsis secondary to UTI [facility])   Open wound of right heel    Discharge Instructions  Discharge Instructions     Diet - low sodium heart healthy   Complete by:  As  directed    Discharge instructions   Complete by: As directed    1)Please take your medications as instructed 2)Follow up with colorectal surgery as an outpatient 3)You will be called by oncology for follow-up appointment for the treatment of your colon cancer 4)Do a CBC and BMP tests in a week   Discharge wound care:   Complete by: As directed    As per wound care nurse   Increase activity slowly   Complete by: As directed       Allergies as of 06/25/2024       Reactions   Ropinirole Anaphylaxis, Swelling, Other (See Comments)   Angioedema   Gabapentin Other (See Comments)   Drowsiness- Made him extremely sleepy - even a low dose        Medication List     TAKE these medications    acetaminophen  500 MG tablet Commonly known as: TYLENOL  Take 500-1,000 mg by mouth every 8 (eight) hours as needed for mild pain (pain score 1-3) (or headaches).   carbidopa -levodopa  25-250 MG tablet Commonly known as: SINEMET  IR Take 1 tablet by mouth in the morning, at noon, and at bedtime.   carvedilol 3.125 MG tablet Commonly known as: COREG Take 3.125 mg by mouth in the morning.   escitalopram  10 MG tablet Commonly known as: LEXAPRO  Take 10 mg by mouth daily. What changed: Another medication with the same name was removed. Continue taking this medication, and follow the directions you see here.   feeding supplement Liqd Take 237 mLs by mouth 2 (two) times daily between meals.   folic acid  1 MG tablet Commonly known as: FOLVITE  Take 1 mg by mouth daily.   Gerhardt's butt cream Crea Apply 1 Application topically 3 (three) times daily.   multivitamin tablet Take 1 tablet by mouth daily with breakfast.   NovoLIN 70/30 Kwikpen (70-30) 100 UNIT/ML KwikPen Generic drug: insulin  isophane & regular human KwikPen Inject 10-20 Units into the skin See admin instructions. Inject 20 units into the skin in the morning and 10 units at bedtime   oxyCODONE  5 MG immediate release  tablet Commonly known as: Oxy IR/ROXICODONE  Take 1 tablet (5 mg total) by mouth every 4 (four) hours as needed for moderate pain (pain score 4-6).   pramipexole  0.25 MG tablet Commonly known as: MIRAPEX  Take 0.25 mg by mouth at bedtime. What changed: Another medication with the same name was removed. Continue taking this medication, and follow the directions you see here.   rosuvastatin  10 MG tablet Commonly known as: CRESTOR  Take 10 mg by mouth at bedtime.   sodium bicarbonate  650 MG tablet Take 1 tablet (650 mg total) by mouth 3 (three) times daily.   tamsulosin  0.4 MG Caps capsule Commonly known as: FLOMAX  Take 1 capsule (0.4 mg total) by mouth daily.   Vitamin B Complex Tabs Take 1 tablet by mouth daily.   VITAMIN D3 PO Take 1 capsule by mouth daily.               Discharge Care Instructions  (From admission, onward)           Start     Ordered   06/25/24 0000  Discharge wound care:       Comments: As per wound care nurse   06/25/24 1138            Contact information for follow-up providers     Teresa Lonni HERO, MD Follow up on 07/20/2024.   Specialties: General  Surgery, Colon and Rectal Surgery Why: 9:40am. Please bring a copy of your photo ID, insurance card and arrive 30 minutes prior to your appointment for paperwork. Contact information: [address] [place] [address] [phone]              Contact information for after-discharge care     Destination     [facility] [place] .   Service: Skilled Nursing Contact information: [address] [place]  [phone]                    Allergies  Allergen Reactions   Ropinirole Anaphylaxis, Swelling and Other (See Comments)    Angioedema   Gabapentin Other (See Comments)    Drowsiness- Made him extremely sleepy - even a low dose    Consultations: General Surgery, GI   Procedures/Studies: CT  CHEST WO CONTRAST Result Date: 06/21/2024 CLINICAL DATA:  Occult malignancy. EXAM: CT CHEST WITHOUT CONTRAST TECHNIQUE: Multidetector CT imaging of the chest was performed following the standard protocol without IV contrast. RADIATION DOSE REDUCTION: This exam was performed according to the departmental dose-optimization program which includes automated exposure control, adjustment of the mA and/or kV according to patient size and/or use of iterative reconstruction technique. COMPARISON:  06/16/2024, 01/25/2019. FINDINGS: Cardiovascular: The heart is normal in size and there is a trace pericardial effusion. Multi-vessel coronary artery calcifications are noted. There is atherosclerotic calcification of the aorta without evidence of aneurysm. The pulmonary trunk is normal in caliber. Mediastinum/Nodes: There is a prominent subcarinal lymph node measuring 1 cm. No axillary lymphadenopathy. Evaluation of the hila is limited due to lack of IV contrast. The thyroid gland, trachea, and esophagus are within normal limits. Lungs/Pleura: There is a small pleural effusion on the left and trace pleural effusion on the right. Consolidation is noted in the lower lobes bilaterally, greater on the left than on the right. Atelectasis is noted in the lingular segment of the left upper lobe. No pneumothorax is seen. There is a 3 mm nodule in the right upper lobe, axial image 21. Upper Abdomen: Scattered hypodensities are noted in the liver, likely cysts. There is a cyst in the mid left kidney. No acute abnormality. Gynecomastia is present bilaterally. Musculoskeletal: Degenerative changes are present in the thoracic spine and bilateral shoulders. No acute or suspicious osseous abnormality is seen. IMPRESSION: 1. No focal mass is seen. 2. Bilateral lower lobe consolidation, possible atelectasis or infiltrate. Follow-up is recommended until resolution to exclude the possibility of underlying malignancy. 3. Small bilateral pleural  effusions. 4. Coronary artery calcifications. 5. Aortic atherosclerosis. Electronically Signed   By: Leita Birmingham M.D.   On: 06/21/2024 16:31   DG Ankle 2 Views Right Result Date: 06/16/2024 CLINICAL DATA:  Repeat lateral image of the right ankle. EXAM: RIGHT ANKLE - 2 VIEW COMPARISON:  Same day radiographs of the right ankle dated 06/16/2024 at 12:13 p.m. FINDINGS: Soft tissue defect/wound overlying the posterior calcaneus. No radiographic evidence of acute osteolysis or erosive changes. Plantar calcaneal spur. IMPRESSION: Soft tissue defect/wound overlying the posterior calcaneus without radiographic evidence to suggest acute osteomyelitis at this time. Electronically Signed   By: Harrietta Sherry M.D.   On: 06/16/2024 14:35   CT ABDOMEN PELVIS WO CONTRAST Result Date: 06/16/2024 CLINICAL DATA:  Abdominal pain, nausea vomiting and diarrhea. EXAM: CT ABDOMEN AND PELVIS WITHOUT CONTRAST TECHNIQUE: Multidetector CT imaging of the abdomen and pelvis was performed following the standard protocol without IV contrast.  RADIATION DOSE REDUCTION: This exam was performed according to the departmental dose-optimization program which includes automated exposure control, adjustment of the mA and/or kV according to patient size and/or use of iterative reconstruction technique. COMPARISON:  CT abdomen pelvis dated 02/13/2023. FINDINGS: Evaluation of this exam is limited in the absence of intravenous contrast. Lower chest: Partially visualized small left pleural effusion. There is coronary vascular calcification. No intra-abdominal free air or free fluid. Hepatobiliary: Small cyst in the left lobe of the liver. No biliary dilatation. The gallbladder is unremarkable. Pancreas: Unremarkable. No pancreatic ductal dilatation or surrounding inflammatory changes. Spleen: Normal in size without focal abnormality. Adrenals/Urinary Tract: The adrenal glands unremarkable. No significant interval change in the left renal interpolar  cyst. Tiny nonobstructing left renal calculi measure up to 2 mm. No hydronephrosis. There is no hydronephrosis or nephrolithiasis on the right. The visualized ureters appear unremarkable. There is slight thickened and irregular appearance of the left lateral bladder wall which may be related to underdistention. A urothelial neoplasm is not excluded. Further evaluation with cystoscopy is recommended. Stomach/Bowel: Significant interval increase in the size of the cecal mass seen on the prior CT filling of the cecum and measuring approximately 4.3 x 8.0 cm (coronal 60/8). This is consistent with a colonic malignancy. Further evaluation with colonoscopy is recommended. There is sigmoid diverticulosis without active inflammatory changes. There is no bowel obstruction. The appendix is not visualized with certainty. No inflammatory changes identified in the right lower quadrant. For Vascular/Lymphatic: Moderate aortoiliac atherosclerotic disease. The IVC is unremarkable. No portal venous gas. There is no adenopathy. A subcentimeter lymph node posterior to the cecum and anterior to the right psoas muscle measures 6 mm in short axis slightly increased since the prior CT. Reproductive: The prostate and seminal vesicles are grossly unremarkable. Penile implant with reservoir in the anterior pelvis. Other: None Musculoskeletal: Osteopenia with degenerative changes of the spine. Total right hip arthroplasty. No acute osseous pathology. IMPRESSION: 1. 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 Further evaluation with colonoscopy is recommended. 2. Sigmoid diverticulosis. No bowel obstruction. 3. Tiny nonobstructing left renal calculi. No hydronephrosis. 4. Slight thickened and irregular appearance of the left lateral bladder wall. A urothelial neoplasm is not excluded. Further evaluation with cystoscopy is recommended. 5.  Aortic Atherosclerosis (ICD10-I70.0). Electronically  Signed   By: Vanetta Chou M.D.   On: 06/16/2024 14:00   DG Ankle Complete Right Result Date: 06/16/2024 EXAM: 3 or more VIEW(S) XRAY OF THE RIGHT ANKLE 06/16/2024 12:17:00 PM CLINICAL HISTORY: Weakness, wound infection - He was released from [facility], post rehab for broken hip recently. Since discharge, he has experienced weakness. He also had nausea, vomiting and diarrhea a few days ago. Pt has a wound on his right anterior ankle from; his leg being in traction from a recent right hip fracture. COMPARISON: None available. FINDINGS: BONES AND JOINTS: Calcaneal spur. No acute fracture. No joint dislocation. SOFT TISSUES: Mild diffuse soft tissue swelling. Soft tissue defect is noted posterior to the calcaneus with underlying focal area of osteopenia involving the posterior calcaneus. Note: this area is obscured by external artifact. If there is a focal wound in this area, I would suggest repeating the lateral projection radiograph of the ankle after removal of the external artifact to confirm presence of focal bone erosion which may reflect osteomyelitis of the calcaneus. IMPRESSION: 1. Soft tissue defect posterior to the calcaneus with underlying focal osteopenia, obscured by external artifact. If a  focal wound is present in this area, I would recommend repeat lateral radiograph after artifact removal to confirm possible bone erosion, which may reflect osteomyelitis. 2. Mild diffuse soft tissue swelling. Electronically signed by: Waddell Calk MD 06/16/2024 12:50 PM EDT RP Workstation: [id number]   DG Chest Portable 1 View Result Date: 06/16/2024 EXAM: 1 VIEW XRAY OF THE CHEST 06/16/2024 12:17:00 PM COMPARISON: 03/23/2024 CLINICAL HISTORY: Weakness, wound infection. Patient was released from [facility], post rehab for broken hip recently. Since discharge, he has experienced weakness. He also had nausea, vomiting and diarrhea a few days ago. Patient has a wound on his right anterior ankle from his  leg being in traction from a recent right hip fracture. FINDINGS: LUNGS AND PLEURA: Low lung volumes with elevated left hemidiaphragm. Mild left lung base atelectasis. No focal pulmonary opacity. No pulmonary edema. No pleural effusion. No pneumothorax. HEART AND MEDIASTINUM: Aortic atherosclerosis. No acute abnormality of the cardiac and mediastinal silhouettes. BONES AND SOFT TISSUES: Remote healed left lateral rib fracture deformity. No acute osseous abnormality. IMPRESSION: 1. Low lung volumes with elevated left hemidiaphragm and mild left lung base atelectasis. Electronically signed by: Waddell Calk MD 06/16/2024 12:46 PM EDT RP Workstation: [id number]      Subjective: Patient seen and examined at bedside today.  Hemodynamically stable.  Tolerating solid diet.  Had 2 bowel movements since yesterday.  Abdomen is soft and nondistended.  No abdominal pain.  General surgery cleared for discharge.  Foley removed.  Medically stable for discharge to SNF today.Called wife to update about discharge planning twice, call not received  Discharge Exam: Vitals:   06/24/24 2145 06/25/24 0512  BP:  120/68  Pulse:  89  Resp: 14 18  Temp:  97.9 F (36.6 C)  SpO2:  100%   Vitals:   06/24/24 1320 06/24/24 2107 06/24/24 2145 06/25/24 0512  BP: 129/62 133/63  120/68  Pulse: 74 74  89  Resp: 20 20 14 18   Temp: 97.6 F (36.4 C) (!) 97.5 F (36.4 C)  97.9 F (36.6 C)  TempSrc: Oral Oral  Oral  SpO2: 97% 98%  100%  Weight:      Height:        General: Pt is alert, awake, not in acute distress Cardiovascular: RRR, S1/S2 +, no rubs, no gallops Respiratory: CTA bilaterally, no wheezing, no rhonchi Abdominal: Soft, NT, ND, bowel sounds +, clean surgical wounds Extremities: no edema, no cyanosis, ulcer on the right lower extremity    The results of significant diagnostics from this hospitalization (including imaging, microbiology, ancillary and laboratory) are listed below for reference.      Microbiology: Recent Results (from the past 240 hours)  Blood culture (routine x 2)     Status: None   Collection Time: 06/16/24 12:08 PM   Specimen: BLOOD  Result Value Ref Range Status   Specimen Description   Final    BLOOD SITE NOT SPECIFIED Performed at [facility], [address] [place], [place] [address]    Special Requests   Final    BOTTLES DRAWN AEROBIC AND ANAEROBIC Blood Culture adequate volume Performed at [facility], [address] [place], [place] [address]    Culture   Final    NO GROWTH 5 DAYS Performed at [facility], [address] [place], [place] [address]    Report Status 06/21/2024 FINAL  Final  Blood culture (routine x 2)     Status: None   Collection Time: 06/16/24  3:17 PM   Specimen: BLOOD  Result Value Ref Range Status   Specimen Description   Final    BLOOD SITE NOT SPECIFIED Performed at [facility], [address], [place], [place] [address]    Special Requests   Final    BOTTLES DRAWN AEROBIC AND ANAEROBIC Blood Culture adequate volume Performed at [facility], [address], [place], [place] [address]    Culture   Final    NO GROWTH 5 DAYS Performed at [facility], [address], [place], [place] [address]    Report Status 06/21/2024 FINAL  Final  Urine Culture     Status: Abnormal   Collection Time: 06/16/24  8:36 PM   Specimen: Urine, Clean Catch  Result Value Ref Range Status   Specimen Description   Final    URINE, CLEAN CATCH Performed at [facility], [address] [place], [place] [address]    Special Requests   Final    NONE Performed at [facility], [address], [place], [place] [address]    Culture MULTIPLE SPECIES PRESENT, SUGGEST RECOLLECTION (A)  Final   Report Status 06/17/2024 FINAL  Final  MRSA Next Gen by PCR, Nasal     Status: None   Collection Time: 06/16/24  8:36 PM    Specimen: Urine, Clean Catch; Nasal Swab  Result Value Ref Range Status   MRSA by PCR Next Gen NOT DETECTED NOT DETECTED Final    Comment: (NOTE) The GeneXpert MRSA Assay (FDA approved for NASAL specimens only), is one component of a comprehensive MRSA colonization surveillance program. It is not intended to diagnose MRSA infection nor to guide or monitor treatment for MRSA infections. Test performance is not FDA approved in patients less than 48 years old. Performed at [facility], [address] [place], [place] [address]   Urine Culture (for pregnant, neutropenic or urologic patients or patients with an indwelling urinary catheter)     Status: Abnormal   Collection Time: 06/18/24 11:21 AM   Specimen: Urine, Clean Catch  Result Value Ref Range Status   Specimen Description   Final    URINE, CLEAN CATCH Performed at [facility], [address] [place], [place] [address]    Special Requests   Final    NONE Performed at [facility], [address], [place], [place] [address]    Culture MULTIPLE SPECIES PRESENT, SUGGEST RECOLLECTION (A)  Final   Report Status 06/19/2024 FINAL  Final     Labs: BNP (last 3 results) No results for input(s): BNP in the last 8760 hours. Basic Metabolic Panel: Recent Labs  Lab 06/19/24 0605 06/23/24 0459 06/25/24 0430  NA 144 140 142  K 3.6 3.9 4.4  CL 106 107 109  CO2 22 21* 22  GLUCOSE 103* 212* 123*  BUN 33* 18 24*  CREATININE 2.43* 2.24* 2.35*  CALCIUM  7.9* 7.5* 7.9*   Liver Function Tests: No results for input(s): AST, ALT, ALKPHOS, BILITOT, PROT, ALBUMIN  in the last 168 hours. No results for input(s): LIPASE, AMYLASE in the last 168 hours. No results for input(s): AMMONIA in the last 168 hours. CBC: Recent Labs  Lab 06/19/24 0605 06/20/24 1122 06/21/24 0950 06/23/24 0459 06/25/24 0430  WBC 8.1 12.5* 8.1 9.8 13.5*  NEUTROABS  --   --  5.8  --    --   HGB 7.8* 7.4* 8.1* 8.7* 8.9*  HCT 26.2* 24.2* 26.4*  28.6* 29.3*  MCV 101.9* 99.6 97.1 98.6 101.4*  PLT 394 462* 415* 332 367   Cardiac Enzymes: No results for input(s): CKTOTAL, CKMB, CKMBINDEX, TROPONINI in the last 168 hours. BNP: Invalid input(s): POCBNP CBG: Recent Labs  Lab 06/24/24 0728 06/24/24 1118 06/24/24 1619 06/24/24 2100 06/25/24 0731  GLUCAP 137* 189* 149* 134* 141*   D-Dimer No results for input(s): DDIMER in the last 72 hours. Hgb A1c No results for input(s): HGBA1C in the last 72 hours. Lipid Profile No results for input(s): CHOL, HDL, LDLCALC, TRIG, CHOLHDL, LDLDIRECT in the last 72 hours. Thyroid function studies No results for input(s): TSH, T4TOTAL, T3FREE, THYROIDAB in the last 72 hours.  Invalid input(s): FREET3 Anemia work up No results for input(s): VITAMINB12, FOLATE, FERRITIN, TIBC, IRON , RETICCTPCT in the last 72 hours. Urinalysis    Component Value Date/Time   COLORURINE YELLOW 06/16/2024 1405   APPEARANCEUR CLOUDY (A) 06/16/2024 1405   LABSPEC 1.011 06/16/2024 1405   PHURINE 5.0 06/16/2024 1405   GLUCOSEU NEGATIVE 06/16/2024 1405   HGBUR MODERATE (A) 06/16/2024 1405   BILIRUBINUR NEGATIVE 06/16/2024 1405   KETONESUR NEGATIVE 06/16/2024 1405   PROTEINUR 100 (A) 06/16/2024 1405   NITRITE POSITIVE (A) 06/16/2024 1405   LEUKOCYTESUR LARGE (A) 06/16/2024 1405   Sepsis Labs Recent Labs  Lab 06/20/24 1122 06/21/24 0950 06/23/24 0459 06/25/24 0430  WBC 12.5* 8.1 9.8 13.5*   Microbiology Recent Results (from the past 240 hours)  Blood culture (routine x 2)     Status: None   Collection Time: 06/16/24 12:08 PM   Specimen: BLOOD  Result Value Ref Range Status   Specimen Description   Final    BLOOD SITE NOT SPECIFIED Performed at [facility], [address], [place], [place] [address]    Special Requests   Final    BOTTLES DRAWN AEROBIC AND ANAEROBIC Blood  Culture adequate volume Performed at [facility], [address], [place], [place] [address]    Culture   Final    NO GROWTH 5 DAYS Performed at [facility], [address] [place], [place] [address]    Report Status 06/21/2024 FINAL  Final  Blood culture (routine x 2)     Status: None   Collection Time: 06/16/24  3:17 PM   Specimen: BLOOD  Result Value Ref Range Status   Specimen Description   Final    BLOOD SITE NOT SPECIFIED Performed at [facility], [address], [place], [place] [address]    Special Requests   Final    BOTTLES DRAWN AEROBIC AND ANAEROBIC Blood Culture adequate volume Performed at [facility], [address] [place], [place] [address]    Culture   Final    NO GROWTH 5 DAYS Performed at [facility], [address], [place], [place] [address]    Report Status 06/21/2024 FINAL  Final  Urine Culture     Status: Abnormal   Collection Time: 06/16/24  8:36 PM   Specimen: Urine, Clean Catch  Result Value Ref Range Status   Specimen Description   Final    URINE, CLEAN CATCH Performed at [facility], [address], [place], [place] [address]    Special Requests   Final    NONE Performed at [facility], [address] [place], [place] [address]    Culture MULTIPLE SPECIES PRESENT, SUGGEST RECOLLECTION (A)  Final   Report Status 06/17/2024 FINAL  Final  MRSA Next Gen by PCR, Nasal     Status: None   Collection Time: 06/16/24  8:36 PM   Specimen: Urine, Clean Catch; Nasal Swab  Result Value Ref Range Status   MRSA by PCR Next Gen NOT DETECTED NOT DETECTED Final    Comment: (NOTE) The GeneXpert MRSA Assay (FDA approved for NASAL specimens only), is one component of a comprehensive MRSA colonization surveillance program. It is not intended to diagnose MRSA infection nor to guide or monitor treatment for MRSA infections. Test performance is not  FDA approved in patients less than 14 years old. Performed at [facility], [address], [place], [place] [address]   Urine Culture (for pregnant, neutropenic or urologic patients or patients with an indwelling urinary catheter)     Status: Abnormal   Collection Time: 06/18/24 11:21 AM   Specimen: Urine, Clean Catch  Result Value Ref Range Status   Specimen Description   Final    URINE, CLEAN CATCH Performed at [facility], [address], [place], [place] [address]    Special Requests   Final    NONE Performed at [facility], [address] [place], [place] [address]    Culture MULTIPLE SPECIES PRESENT, SUGGEST RECOLLECTION (A)  Final   Report Status 06/19/2024 FINAL  Final    Please note: You were cared for by a hospitalist during your hospital stay. Once you are discharged, your primary care physician will handle any further medical issues. Please note that NO REFILLS for any discharge medications will be authorized once you are discharged, as it is imperative that you return to your primary care physician (or establish a relationship with a primary care physician if you do not have one) for your post hospital discharge needs so that they can reassess your need for medications and monitor your lab values.    Time coordinating discharge: 40 minutes  SIGNED:   Ivonne Mustache, MD  [facility] 06/25/2024, 11:38 AM Pager [phone]  If 7PM-7AM, please contact night-coverage [email or web address] Password [email or web address]

## 2024-06-25 NOTE — Progress Notes (Signed)
 OT Cancellation Note  Patient Details Name: Jake Bartlett MRN: [id number] DOB: [birth year]   Cancelled Treatment:    Reason Eval/Treat Not Completed: Pain limiting ability to participate Discussed with RN - pt recently participated in nursing care, pain increased and recently received pain meds. OT to check back as able to maximize comfort and overall mobility participation.   Rainee Sweatt L. Nataliah Hatlestad, OTR/L  06/25/24, 11:23 AM

## 2024-06-25 NOTE — Plan of Care (Signed)
  Problem: Fluid Volume: Goal: Ability to maintain a balanced intake and output will improve Outcome: Progressing   Problem: Nutritional: Goal: Maintenance of adequate nutrition will improve Outcome: Progressing Goal: Progress toward achieving an optimal weight will improve Outcome: Progressing   Problem: Skin Integrity: Goal: Risk for impaired skin integrity will decrease Outcome: Progressing   Problem: Nutrition: Goal: Adequate nutrition will be maintained Outcome: Progressing   Problem: Elimination: Goal: Will not experience complications related to bowel motility Outcome: Progressing   Problem: Pain Managment: Goal: General experience of comfort will improve and/or be controlled Outcome: Progressing   Problem: Safety: Goal: Ability to remain free from injury will improve Outcome: Progressing   Problem: Skin Integrity: Goal: Risk for impaired skin integrity will decrease Outcome: Progressing   Problem: Activity: Goal: Risk for activity intolerance will decrease Outcome: Not Progressing

## 2024-06-25 NOTE — Plan of Care (Signed)
  Problem: Education: Goal: Ability to describe self-care measures that may prevent or decrease complications (Diabetes Survival Skills Education) will improve Outcome: Adequate for Discharge Goal: Individualized Educational Video(s) Outcome: Adequate for Discharge   Problem: Coping: Goal: Ability to adjust to condition or change in health will improve Outcome: Adequate for Discharge   Problem: Fluid Volume: Goal: Ability to maintain a balanced intake and output will improve 06/25/2024 1457 by Rosanne Elspeth HERO, RN Outcome: Adequate for Discharge 06/25/2024 1156 by Rosanne Elspeth HERO, RN Outcome: Progressing   Problem: Health Behavior/Discharge Planning: Goal: Ability to identify and utilize available resources and services will improve Outcome: Adequate for Discharge Goal: Ability to manage health-related needs will improve Outcome: Adequate for Discharge   Problem: Metabolic: Goal: Ability to maintain appropriate glucose levels will improve Outcome: Adequate for Discharge   Problem: Nutritional: Goal: Maintenance of adequate nutrition will improve 06/25/2024 1457 by Rosanne Elspeth HERO, RN Outcome: Adequate for Discharge 06/25/2024 1156 by Rosanne Elspeth HERO, RN Outcome: Progressing Goal: Progress toward achieving an optimal weight will improve 06/25/2024 1457 by Rosanne Elspeth HERO, RN Outcome: Adequate for Discharge 06/25/2024 1156 by Rosanne Elspeth HERO, RN Outcome: Progressing   Problem: Skin Integrity: Goal: Risk for impaired skin integrity will decrease 06/25/2024 1457 by Rosanne Elspeth HERO, RN Outcome: Adequate for Discharge 06/25/2024 1156 by Rosanne Elspeth HERO, RN Outcome: Progressing   Problem: Tissue Perfusion: Goal: Adequacy of tissue perfusion will improve Outcome: Adequate for Discharge   Problem: Education: Goal: Knowledge of General Education information will improve Description: Including pain rating scale, medication(s)/side effects and  non-pharmacologic comfort measures Outcome: Adequate for Discharge   Problem: Health Behavior/Discharge Planning: Goal: Ability to manage health-related needs will improve Outcome: Adequate for Discharge   Problem: Clinical Measurements: Goal: Ability to maintain clinical measurements within normal limits will improve Outcome: Adequate for Discharge Goal: Will remain free from infection Outcome: Adequate for Discharge Goal: Diagnostic test results will improve Outcome: Adequate for Discharge Goal: Respiratory complications will improve Outcome: Adequate for Discharge Goal: Cardiovascular complication will be avoided Outcome: Adequate for Discharge   Problem: Activity: Goal: Risk for activity intolerance will decrease 06/25/2024 1457 by Rosanne Elspeth HERO, RN Outcome: Adequate for Discharge 06/25/2024 1156 by Rosanne Elspeth HERO, RN Outcome: Not Progressing   Problem: Nutrition: Goal: Adequate nutrition will be maintained 06/25/2024 1457 by Rosanne Elspeth HERO, RN Outcome: Adequate for Discharge 06/25/2024 1156 by Rosanne Elspeth HERO, RN Outcome: Progressing   Problem: Coping: Goal: Level of anxiety will decrease Outcome: Adequate for Discharge   Problem: Elimination: Goal: Will not experience complications related to bowel motility 06/25/2024 1457 by Rosanne Elspeth HERO, RN Outcome: Adequate for Discharge 06/25/2024 1156 by Rosanne Elspeth HERO, RN Outcome: Progressing Goal: Will not experience complications related to urinary retention Outcome: Adequate for Discharge   Problem: Pain Managment: Goal: General experience of comfort will improve and/or be controlled 06/25/2024 1457 by Rosanne Elspeth HERO, RN Outcome: Adequate for Discharge 06/25/2024 1156 by Rosanne Elspeth HERO, RN Outcome: Progressing   Problem: Safety: Goal: Ability to remain free from injury will improve 06/25/2024 1457 by Rosanne Elspeth HERO, RN Outcome: Adequate for Discharge 06/25/2024 1156 by  Rosanne Elspeth HERO, RN Outcome: Progressing   Problem: Skin Integrity: Goal: Risk for impaired skin integrity will decrease 06/25/2024 1457 by Rosanne Elspeth HERO, RN Outcome: Adequate for Discharge 06/25/2024 1156 by Rosanne Elspeth HERO, RN Outcome: Progressing

## 2024-06-25 NOTE — Progress Notes (Signed)
 3 Days Post-Op  Subjective: CC: He reports no current abdominal pain. Tolerating regular diet without n/v. BM yesterday and this AM. RN about to remove foley. He was oob to the chair yesterday.   Objective: Vital signs in last 24 hours: Temp:  [97.5 F (36.4 C)-97.9 F (36.6 C)] 97.9 F (36.6 C) (09/05 0512) Pulse Rate:  [74-89] 89 (09/05 0512) Resp:  [14-20] 18 (09/05 0512) BP: (120-133)/(62-68) 120/68 (09/05 0512) SpO2:  [97 %-100 %] 100 % (09/05 0512) Last BM Date : 06/22/24  Intake/Output from previous day: 09/04 0701 - 09/05 0700 In: 240 [P.O.:240] Out: 625 [Urine:625] Intake/Output this shift: Total I/O In: -  Out: 100 [Urine:100]  PE: Gen:  Alert, NAD, pleasant Abd: Soft, mild distension, generalized ttp that is most around his incisions, no rigidity or guarding. Midline and laparoscopic incisions with dermabond in place, cdi.  GU: Foley in place with straw colored urine in foley bag  Lab Results:  Recent Labs    06/23/24 0459 06/25/24 0430  WBC 9.8 13.5*  HGB 8.7* 8.9*  HCT 28.6* 29.3*  PLT 332 367   BMET Recent Labs    06/23/24 0459 06/25/24 0430  NA 140 142  K 3.9 4.4  CL 107 109  CO2 21* 22  GLUCOSE 212* 123*  BUN 18 24*  CREATININE 2.24* 2.35*  CALCIUM  7.5* 7.9*   PT/INR No results for input(s): LABPROT, INR in the last 72 hours. CMP     Component Value Date/Time   NA 142 06/25/2024 0430   K 4.4 06/25/2024 0430   CL 109 06/25/2024 0430   CO2 22 06/25/2024 0430   GLUCOSE 123 (H) 06/25/2024 0430   BUN 24 (H) 06/25/2024 0430   CREATININE 2.35 (H) 06/25/2024 0430   CALCIUM  7.9 (L) 06/25/2024 0430   PROT 6.4 (L) 06/16/2024 1208   ALBUMIN  2.7 (L) 06/16/2024 1208   AST 25 06/16/2024 1208   ALT <5 06/16/2024 1208   ALKPHOS 158 (H) 06/16/2024 1208   BILITOT 0.4 06/16/2024 1208   GFRNONAA 27 (L) 06/25/2024 0430   Lipase     Component Value Date/Time   LIPASE 37 06/16/2024 1208    Studies/Results: No results  found.   Anti-infectives: Anti-infectives (From admission, onward)    Start     Dose/Rate Route Frequency Ordered Stop   06/22/24 1015  cefoTEtan  (CEFOTAN ) 2 g in sodium chloride  0.9 % 100 mL IVPB        2 g 200 mL/hr over 30 Minutes Intravenous On call to O.R. 06/22/24 1005 06/22/24 1445   06/17/24 1500  cefTRIAXone  (ROCEPHIN ) 2 g in sodium chloride  0.9 % 100 mL IVPB  Status:  Discontinued        2 g 200 mL/hr over 30 Minutes Intravenous Every 24 hours 06/16/24 1710 06/22/24 0854   06/16/24 1500  cefTRIAXone  (ROCEPHIN ) 1 g in sodium chloride  0.9 % 100 mL IVPB        1 g 200 mL/hr over 30 Minutes Intravenous  Once 06/16/24 1447 06/16/24 1548      A.   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  - All margins negative for malignancy.  - Tubular adenomas, multiple, negative for high-grade dysplasia,  including 0.6 cm tubular adenoma at distal margin (negative for  high-grade dysplasia).  - All regional lymph nodes negative for malignancy (0/12).  - Appendix with luminal dilatation and focal mucosal hypertrophy,  negative for malignancy.   Assessment/Plan POD 3 s/p laparoscopic right hemicolectomy by Dr. 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 I discussed pathology with him. Oncology has been consulted. He has follow up arranged with Dr. Cloretta - CEA QNSREP - ?quantity not sufficient repeat? Repeat CEA pending but this was drawn post op - CT chest shows no evident metastatic disease in the lungs - Okay for diet as tolerated - Mobilize, PT - recommending SNF  FEN - Reg; IVF per TRH VTE - SCDs, SQH ID - None currently. Foley- per primary, they  are planning TOV today   LOS: 9 days    Jake Bartlett Shaper, [facility] Surgery 06/25/2024, 11:17 AM Please see Amion for pager number during day hours 7:00am-4:30pm

## 2024-06-25 NOTE — TOC Transition Note (Signed)
 Transition of Care [facility]) - Discharge Note   Patient Details  Name: Jake Bartlett MRN: [id number] Date of Birth: [birth year]  Transition of Care [facility]) CM/SW Contact:  Sheri ONEIDA Sharps, LCSW Phone Number: 06/25/2024, 1:41 PM   Clinical Narrative:    Pt medically ready to [facility]. Call report [phone] room 508p given to nurse. DC packet left at nurses station. PTAR called at 1:30pm. No further TOC needs.   Final next level of care: Skilled Nursing Facility Barriers to Discharge: Barriers Resolved   Patient Goals and CMS Choice Patient states their goals for this hospitalization and ongoing recovery are:: [facility].gov Compare Post Acute Care list provided to:: Patient Choice offered to / list presented to : Patient Bee ownership interest in [facility].provided to:: Patient    Discharge Placement PASRR number recieved: 06/18/24            Patient chooses bed at: [facility] Patient to be transferred to facility by: PTAR Name of family member notified: Jaydrien, [place] (Spouse)  [phone] Patient and family notified of of transfer: 06/25/24  Discharge Plan and Services Additional resources added to the After Visit Summary for       Post Acute Care Choice: Skilled Nursing Facility          DME Arranged: N/A DME Agency: NA       HH Arranged: NA HH Agency: NA        Social Drivers of Health (SDOH) Interventions SDOH Screenings   Food Insecurity: No Food Insecurity (06/16/2024)  Housing: Low Risk  (06/16/2024)  Transportation Needs: No Transportation Needs (06/16/2024)  Utilities: Not At Risk (06/16/2024)  Financial Resource Strain: Low Risk  (12/05/2023)   Received from [facility]  Physical Activity: Insufficiently Active (06/24/2023)   Received from [facility]  Social Connections: Moderately Integrated (06/16/2024)  Stress: No Stress Concern Present (06/24/2023)   Received from [facility]  Tobacco Use: Low Risk  (06/22/2024)      Readmission Risk Interventions    06/25/2024    1:38 PM 06/19/2024    4:28 PM  Readmission Risk Prevention Plan  Post Dischage Appt  Complete  Medication Screening  Complete  Transportation Screening Complete Complete  PCP or Specialist Appt within 5-7 Days Complete   Home Care Screening Complete   Medication Review (RN CM) Complete

## 2024-06-27 ENCOUNTER — Inpatient Hospital Stay (HOSPITAL_COMMUNITY)

## 2024-06-27 ENCOUNTER — Emergency Department (HOSPITAL_COMMUNITY)

## 2024-06-27 ENCOUNTER — Inpatient Hospital Stay (HOSPITAL_COMMUNITY)
Admission: EM | Admit: 2024-06-27 | Discharge: 2024-07-05 | DRG: 871 | Disposition: A | Discharge status: Hospice | Attending: Internal Medicine | Admitting: Internal Medicine

## 2024-06-27 DIAGNOSIS — J90 Pleural effusion, not elsewhere classified: Secondary | ICD-10-CM | POA: Diagnosis present

## 2024-06-27 DIAGNOSIS — J9811 Atelectasis: Secondary | ICD-10-CM | POA: Diagnosis present

## 2024-06-27 DIAGNOSIS — L89513 Pressure ulcer of right ankle, stage 3: Secondary | ICD-10-CM | POA: Diagnosis present

## 2024-06-27 DIAGNOSIS — Z66 Do not resuscitate: Secondary | ICD-10-CM | POA: Diagnosis present

## 2024-06-27 DIAGNOSIS — Z789 Other specified health status: Secondary | ICD-10-CM | POA: Diagnosis not present

## 2024-06-27 DIAGNOSIS — Z7189 Other specified counseling: Secondary | ICD-10-CM | POA: Diagnosis not present

## 2024-06-27 DIAGNOSIS — R569 Unspecified convulsions: Secondary | ICD-10-CM | POA: Diagnosis not present

## 2024-06-27 DIAGNOSIS — R531 Weakness: Secondary | ICD-10-CM | POA: Diagnosis not present

## 2024-06-27 DIAGNOSIS — G9341 Metabolic encephalopathy: Secondary | ICD-10-CM | POA: Diagnosis present

## 2024-06-27 DIAGNOSIS — L89610 Pressure ulcer of right heel, unstageable: Secondary | ICD-10-CM | POA: Diagnosis present

## 2024-06-27 DIAGNOSIS — D631 Anemia in chronic kidney disease: Secondary | ICD-10-CM | POA: Diagnosis present

## 2024-06-27 DIAGNOSIS — I959 Hypotension, unspecified: Secondary | ICD-10-CM

## 2024-06-27 DIAGNOSIS — R579 Shock, unspecified: Secondary | ICD-10-CM | POA: Diagnosis not present

## 2024-06-27 DIAGNOSIS — R55 Syncope and collapse: Secondary | ICD-10-CM

## 2024-06-27 DIAGNOSIS — E1165 Type 2 diabetes mellitus with hyperglycemia: Secondary | ICD-10-CM | POA: Diagnosis not present

## 2024-06-27 DIAGNOSIS — G9340 Encephalopathy, unspecified: Secondary | ICD-10-CM

## 2024-06-27 DIAGNOSIS — E162 Hypoglycemia, unspecified: Secondary | ICD-10-CM

## 2024-06-27 DIAGNOSIS — J9600 Acute respiratory failure, unspecified whether with hypoxia or hypercapnia: Secondary | ICD-10-CM | POA: Diagnosis not present

## 2024-06-27 DIAGNOSIS — E43 Unspecified severe protein-calorie malnutrition: Secondary | ICD-10-CM | POA: Diagnosis present

## 2024-06-27 DIAGNOSIS — E66812 Obesity, class 2: Secondary | ICD-10-CM | POA: Diagnosis present

## 2024-06-27 DIAGNOSIS — R6521 Severe sepsis with septic shock: Secondary | ICD-10-CM | POA: Diagnosis present

## 2024-06-27 DIAGNOSIS — E870 Hyperosmolality and hypernatremia: Secondary | ICD-10-CM | POA: Diagnosis present

## 2024-06-27 DIAGNOSIS — Z515 Encounter for palliative care: Secondary | ICD-10-CM | POA: Diagnosis not present

## 2024-06-27 DIAGNOSIS — A419 Sepsis, unspecified organism: Principal | ICD-10-CM | POA: Diagnosis present

## 2024-06-27 DIAGNOSIS — G20A1 Parkinson's disease without dyskinesia, without mention of fluctuations: Secondary | ICD-10-CM | POA: Diagnosis present

## 2024-06-27 DIAGNOSIS — J9601 Acute respiratory failure with hypoxia: Secondary | ICD-10-CM | POA: Diagnosis present

## 2024-06-27 DIAGNOSIS — G928 Other toxic encephalopathy: Secondary | ICD-10-CM | POA: Diagnosis not present

## 2024-06-27 DIAGNOSIS — I129 Hypertensive chronic kidney disease with stage 1 through stage 4 chronic kidney disease, or unspecified chronic kidney disease: Secondary | ICD-10-CM | POA: Diagnosis present

## 2024-06-27 DIAGNOSIS — N179 Acute kidney failure, unspecified: Secondary | ICD-10-CM | POA: Diagnosis present

## 2024-06-27 DIAGNOSIS — E1122 Type 2 diabetes mellitus with diabetic chronic kidney disease: Secondary | ICD-10-CM | POA: Diagnosis present

## 2024-06-27 DIAGNOSIS — R638 Other symptoms and signs concerning food and fluid intake: Secondary | ICD-10-CM | POA: Diagnosis not present

## 2024-06-27 DIAGNOSIS — D110 Benign neoplasm of parotid gland: Secondary | ICD-10-CM | POA: Diagnosis present

## 2024-06-27 DIAGNOSIS — F05 Delirium due to known physiological condition: Secondary | ICD-10-CM | POA: Diagnosis not present

## 2024-06-27 DIAGNOSIS — E876 Hypokalemia: Secondary | ICD-10-CM | POA: Diagnosis present

## 2024-06-27 DIAGNOSIS — E861 Hypovolemia: Secondary | ICD-10-CM | POA: Diagnosis present

## 2024-06-27 DIAGNOSIS — E7800 Pure hypercholesterolemia, unspecified: Secondary | ICD-10-CM | POA: Diagnosis present

## 2024-06-27 DIAGNOSIS — Z888 Allergy status to other drugs, medicaments and biological substances status: Secondary | ICD-10-CM

## 2024-06-27 DIAGNOSIS — D509 Iron deficiency anemia, unspecified: Secondary | ICD-10-CM

## 2024-06-27 DIAGNOSIS — F39 Unspecified mood [affective] disorder: Secondary | ICD-10-CM | POA: Diagnosis present

## 2024-06-27 DIAGNOSIS — N184 Chronic kidney disease, stage 4 (severe): Secondary | ICD-10-CM | POA: Diagnosis present

## 2024-06-27 DIAGNOSIS — I69154 Hemiplegia and hemiparesis following nontraumatic intracerebral hemorrhage affecting left non-dominant side: Secondary | ICD-10-CM

## 2024-06-27 DIAGNOSIS — I69298 Other sequelae of other nontraumatic intracranial hemorrhage: Secondary | ICD-10-CM

## 2024-06-27 DIAGNOSIS — J189 Pneumonia, unspecified organism: Secondary | ICD-10-CM | POA: Diagnosis not present

## 2024-06-27 DIAGNOSIS — R4182 Altered mental status, unspecified: Secondary | ICD-10-CM | POA: Diagnosis not present

## 2024-06-27 DIAGNOSIS — L89626 Pressure-induced deep tissue damage of left heel: Secondary | ICD-10-CM | POA: Diagnosis present

## 2024-06-27 DIAGNOSIS — R609 Edema, unspecified: Secondary | ICD-10-CM | POA: Diagnosis present

## 2024-06-27 DIAGNOSIS — J690 Pneumonitis due to inhalation of food and vomit: Secondary | ICD-10-CM | POA: Diagnosis present

## 2024-06-27 DIAGNOSIS — E11649 Type 2 diabetes mellitus with hypoglycemia without coma: Secondary | ICD-10-CM | POA: Diagnosis not present

## 2024-06-27 DIAGNOSIS — Z87440 Personal history of urinary (tract) infections: Secondary | ICD-10-CM

## 2024-06-27 DIAGNOSIS — Z886 Allergy status to analgesic agent status: Secondary | ICD-10-CM

## 2024-06-27 DIAGNOSIS — J159 Unspecified bacterial pneumonia: Secondary | ICD-10-CM | POA: Diagnosis present

## 2024-06-27 DIAGNOSIS — R404 Transient alteration of awareness: Principal | ICD-10-CM

## 2024-06-27 DIAGNOSIS — R54 Age-related physical debility: Secondary | ICD-10-CM | POA: Diagnosis present

## 2024-06-27 DIAGNOSIS — Z794 Long term (current) use of insulin: Secondary | ICD-10-CM

## 2024-06-27 DIAGNOSIS — Z9049 Acquired absence of other specified parts of digestive tract: Secondary | ICD-10-CM

## 2024-06-27 DIAGNOSIS — C189 Malignant neoplasm of colon, unspecified: Secondary | ICD-10-CM | POA: Diagnosis present

## 2024-06-27 DIAGNOSIS — L89156 Pressure-induced deep tissue damage of sacral region: Secondary | ICD-10-CM | POA: Diagnosis present

## 2024-06-27 DIAGNOSIS — Z960 Presence of urogenital implants: Secondary | ICD-10-CM | POA: Diagnosis present

## 2024-06-27 DIAGNOSIS — Z79899 Other long term (current) drug therapy: Secondary | ICD-10-CM

## 2024-06-27 DIAGNOSIS — Z6836 Body mass index (BMI) 36.0-36.9, adult: Secondary | ICD-10-CM

## 2024-06-27 DIAGNOSIS — R1312 Dysphagia, oropharyngeal phase: Secondary | ICD-10-CM | POA: Diagnosis present

## 2024-06-27 LAB — COMPREHENSIVE METABOLIC PANEL WITH GFR
ALT: <5 U/L (ref 0–44)
AST: 15 U/L (ref 15–41)
Albumin: <1.5 g/dL — ABNORMAL LOW (ref 3.5–5.0)
Alkaline Phosphatase: 115 U/L (ref 38–126)
Anion gap: 7 (ref 5–15)
BUN: 26 mg/dL — ABNORMAL HIGH (ref 8–23)
CO2: 22 mmol/L (ref 22–32)
Calcium: 7.1 mg/dL — ABNORMAL LOW (ref 8.9–10.3)
Chloride: 105 mmol/L (ref 98–111)
Creatinine, Ser: 2.6 mg/dL — ABNORMAL HIGH (ref 0.61–1.24)
GFR, Estimated: 24 mL/min — ABNORMAL LOW (ref >60)
Glucose, Bld: 106 mg/dL — ABNORMAL HIGH (ref 70–99)
Potassium: 3.3 mmol/L — ABNORMAL LOW (ref 3.5–5.1)
Sodium: 134 mmol/L — ABNORMAL LOW (ref 135–145)
Total Bilirubin: 0.4 mg/dL (ref 0.0–1.2)
Total Protein: 4.6 g/dL — ABNORMAL LOW (ref 6.5–8.1)

## 2024-06-27 LAB — AMMONIA: Ammonia: 33 umol/L (ref 9–35)

## 2024-06-27 LAB — CBC WITH DIFFERENTIAL/PLATELET
Abs Immature Granulocytes: 0.25 K/uL — ABNORMAL HIGH (ref 0.00–0.07)
Basophils Absolute: 0.1 K/uL (ref 0.0–0.1)
Basophils Relative: 0 %
Eosinophils Absolute: 0.1 K/uL (ref 0.0–0.5)
Eosinophils Relative: 1 %
HCT: 29.9 % — ABNORMAL LOW (ref 39.0–52.0)
Hemoglobin: 9.5 g/dL — ABNORMAL LOW (ref 13.0–17.0)
Immature Granulocytes: 1 %
Lymphocytes Relative: 6 %
Lymphs Abs: 1.5 K/uL (ref 0.7–4.0)
MCH: 31.4 pg (ref 26.0–34.0)
MCHC: 31.8 g/dL (ref 30.0–36.0)
MCV: 98.7 fL (ref 80.0–100.0)
Monocytes Absolute: 1.1 K/uL — ABNORMAL HIGH (ref 0.1–1.0)
Monocytes Relative: 5 %
Neutro Abs: 21.6 K/uL — ABNORMAL HIGH (ref 1.7–7.7)
Neutrophils Relative %: 87 %
Platelets: 503 K/uL — ABNORMAL HIGH (ref 150–400)
RBC: 3.03 MIL/uL — ABNORMAL LOW (ref 4.22–5.81)
RDW: 17.6 % — ABNORMAL HIGH (ref 11.5–15.5)
WBC: 24.6 K/uL — ABNORMAL HIGH (ref 4.0–10.5)
nRBC: 0 % (ref 0.0–0.2)

## 2024-06-27 LAB — I-STAT ARTERIAL BLOOD GAS, ED
Acid-base deficit: 7 mmol/L — ABNORMAL HIGH (ref 0.0–2.0)
Bicarbonate: 17.7 mmol/L — ABNORMAL LOW (ref 20.0–28.0)
Calcium, Ion: 1.07 mmol/L — ABNORMAL LOW (ref 1.15–1.40)
HCT: 18 % — ABNORMAL LOW (ref 39.0–52.0)
Hemoglobin: 6.1 g/dL — CL (ref 13.0–17.0)
O2 Saturation: 100 %
Patient temperature: 98.69
Potassium: 2.1 mmol/L — CL (ref 3.5–5.1)
Sodium: 141 mmol/L (ref 135–145)
TCO2: 19 mmol/L — ABNORMAL LOW (ref 22–32)
pCO2 arterial: 31.2 mmHg — ABNORMAL LOW (ref 32–48)
pH, Arterial: 7.362 (ref 7.35–7.45)
pO2, Arterial: 439 mmHg — ABNORMAL HIGH (ref 83–108)

## 2024-06-27 LAB — TSH: TSH: 2.261 u[IU]/mL (ref 0.350–4.500)

## 2024-06-27 LAB — HEMOGLOBIN AND HEMATOCRIT, BLOOD
HCT: 33 % — ABNORMAL LOW (ref 39.0–52.0)
Hemoglobin: 10.1 g/dL — ABNORMAL LOW (ref 13.0–17.0)

## 2024-06-27 LAB — I-STAT CHEM 8, ED
BUN: 29 mg/dL — ABNORMAL HIGH (ref 8–23)
Calcium, Ion: 1.06 mmol/L — ABNORMAL LOW (ref 1.15–1.40)
Chloride: 105 mmol/L (ref 98–111)
Creatinine, Ser: 2.7 mg/dL — ABNORMAL HIGH (ref 0.61–1.24)
Glucose, Bld: 104 mg/dL — ABNORMAL HIGH (ref 70–99)
HCT: 28 % — ABNORMAL LOW (ref 39.0–52.0)
Hemoglobin: 9.5 g/dL — ABNORMAL LOW (ref 13.0–17.0)
Potassium: 3.4 mmol/L — ABNORMAL LOW (ref 3.5–5.1)
Sodium: 139 mmol/L (ref 135–145)
TCO2: 21 mmol/L — ABNORMAL LOW (ref 22–32)

## 2024-06-27 LAB — BASIC METABOLIC PANEL WITH GFR
Anion gap: 10 (ref 5–15)
BUN: 27 mg/dL — ABNORMAL HIGH (ref 8–23)
CO2: 17 mmol/L — ABNORMAL LOW (ref 22–32)
Calcium: 7 mg/dL — ABNORMAL LOW (ref 8.9–10.3)
Chloride: 112 mmol/L — ABNORMAL HIGH (ref 98–111)
Creatinine, Ser: 2.36 mg/dL — ABNORMAL HIGH (ref 0.61–1.24)
GFR, Estimated: 26 mL/min — ABNORMAL LOW (ref >60)
Glucose, Bld: 59 mg/dL — ABNORMAL LOW (ref 70–99)
Potassium: 3 mmol/L — ABNORMAL LOW (ref 3.5–5.1)
Sodium: 139 mmol/L (ref 135–145)

## 2024-06-27 LAB — GLUCOSE, CAPILLARY
Glucose-Capillary: 92 mg/dL (ref 70–99)
Glucose-Capillary: 102 mg/dL — ABNORMAL HIGH (ref 70–99)
Glucose-Capillary: 52 mg/dL — ABNORMAL LOW (ref 70–99)
Glucose-Capillary: 91 mg/dL (ref 70–99)

## 2024-06-27 LAB — TYPE AND SCREEN
ABO/RH(D): O POS
Antibody Screen: NEGATIVE

## 2024-06-27 LAB — ETHANOL: Alcohol, Ethyl (B): <15 mg/dL (ref <15)

## 2024-06-27 LAB — MRSA NEXT GEN BY PCR, NASAL: MRSA by PCR Next Gen: NOT DETECTED

## 2024-06-27 LAB — I-STAT CG4 LACTIC ACID, ED: Lactic Acid, Venous: 1.2 mmol/L (ref 0.5–1.9)

## 2024-06-27 LAB — CBG MONITORING, ED
Glucose-Capillary: 114 mg/dL — ABNORMAL HIGH (ref 70–99)
Glucose-Capillary: 80 mg/dL (ref 70–99)

## 2024-06-27 LAB — MAGNESIUM: Magnesium: 1.8 mg/dL (ref 1.7–2.4)

## 2024-06-27 MED ORDER — LACTATED RINGERS IV BOLUS
1000.0000 mL | Freq: Once | INTRAVENOUS | Status: AC
  Administered 2024-06-27: 1000 mL via INTRAVENOUS

## 2024-06-27 MED ORDER — PIPERACILLIN-TAZOBACTAM 3.375 G IVPB
3.3750 g | Freq: Three times a day (TID) | INTRAVENOUS | Status: DC
  Administered 2024-06-27 – 2024-07-05 (×23): 3.375 g via INTRAVENOUS
  Filled 2024-06-27 (×23): qty 50

## 2024-06-27 MED ORDER — LACTATED RINGERS IV BOLUS
500.0000 mL | Freq: Once | INTRAVENOUS | Status: AC
  Administered 2024-06-27: 500 mL via INTRAVENOUS

## 2024-06-27 MED ORDER — VANCOMYCIN HCL 1250 MG/250ML IV SOLN: 1250.0000 mg | INTRAVENOUS | Status: DC

## 2024-06-27 MED ORDER — POTASSIUM CHLORIDE 20 MEQ PO PACK
40.0000 meq | PACK | Freq: Once | ORAL | Status: AC
  Administered 2024-06-27: 40 meq
  Filled 2024-06-27: qty 2

## 2024-06-27 MED ORDER — ROCURONIUM BROMIDE 10 MG/ML (PF) SYRINGE
PREFILLED_SYRINGE | INTRAVENOUS | Status: AC | PRN
  Administered 2024-06-27: 80 mg via INTRAVENOUS

## 2024-06-27 MED ORDER — CHLORHEXIDINE GLUCONATE CLOTH 2 % EX PADS
6.0000 | MEDICATED_PAD | Freq: Every day | CUTANEOUS | Status: DC
  Administered 2024-06-27 – 2024-07-04 (×8): 6 via TOPICAL

## 2024-06-27 MED ORDER — CALCIUM GLUCONATE-NACL 1-0.675 GM/50ML-% IV SOLN
1.0000 g | Freq: Once | INTRAVENOUS | Status: AC
  Administered 2024-06-27: 1000 mg via INTRAVENOUS
  Filled 2024-06-27: qty 50

## 2024-06-27 MED ORDER — SODIUM CHLORIDE 0.9 % IV SOLN: 2.0000 g | Freq: Once | INTRAVENOUS | Status: DC

## 2024-06-27 MED ORDER — DEXTROSE 50 % IV SOLN
25.0000 g | Freq: Once | INTRAVENOUS | Status: AC
  Administered 2024-06-27: 25 g via INTRAVENOUS

## 2024-06-27 MED ORDER — ORAL CARE MOUTH RINSE
15.0000 mL | OROMUCOSAL | Status: DC
  Administered 2024-06-27 – 2024-06-29 (×24): 15 mL via OROMUCOSAL

## 2024-06-27 MED ORDER — FENTANYL BOLUS VIA INFUSION
25.0000 ug | INTRAVENOUS | Status: DC | PRN
  Administered 2024-06-28 (×3): 25 ug via INTRAVENOUS
  Administered 2024-06-28: 100 ug via INTRAVENOUS
  Administered 2024-06-28: 50 ug via INTRAVENOUS
  Administered 2024-06-28 – 2024-06-29 (×2): 25 ug via INTRAVENOUS

## 2024-06-27 MED ORDER — NOREPINEPHRINE 4 MG/250ML-% IV SOLN: 0.0000 ug/min | INTRAVENOUS | Status: DC

## 2024-06-27 MED ORDER — IOHEXOL 350 MG/ML SOLN
100.0000 mL | Freq: Once | INTRAVENOUS | Status: AC | PRN
  Administered 2024-06-27: 100 mL via INTRAVENOUS

## 2024-06-27 MED ORDER — FENTANYL CITRATE PF 50 MCG/ML IJ SOSY: 25.0000 ug | PREFILLED_SYRINGE | Freq: Once | INTRAMUSCULAR | Status: DC

## 2024-06-27 MED ORDER — VANCOMYCIN HCL IN DEXTROSE 1-5 GM/200ML-% IV SOLN: 1000.0000 mg | Freq: Once | INTRAVENOUS | Status: DC

## 2024-06-27 MED ORDER — PANTOPRAZOLE SODIUM 40 MG IV SOLR
40.0000 mg | Freq: Every day | INTRAVENOUS | Status: DC
  Administered 2024-06-27 – 2024-07-01 (×5): 40 mg via INTRAVENOUS
  Filled 2024-06-27 (×5): qty 10

## 2024-06-27 MED ORDER — VANCOMYCIN HCL 1500 MG/300ML IV SOLN
1500.0000 mg | Freq: Once | INTRAVENOUS | Status: AC
  Administered 2024-06-27: 1500 mg via INTRAVENOUS
  Filled 2024-06-27: qty 300

## 2024-06-27 MED ORDER — NOREPINEPHRINE 4 MG/250ML-% IV SOLN
0.0000 ug/min | INTRAVENOUS | Status: DC
  Administered 2024-06-27: 10 ug/min via INTRAVENOUS
  Administered 2024-06-27: 4 ug/min via INTRAVENOUS
  Administered 2024-06-28: 10 ug/min via INTRAVENOUS
  Administered 2024-06-28: 14 ug/min via INTRAVENOUS
  Administered 2024-06-29 (×2): 10 ug/min via INTRAVENOUS
  Administered 2024-06-29: 4 ug/min via INTRAVENOUS
  Administered 2024-06-29: 12 ug/min via INTRAVENOUS
  Administered 2024-06-29: 4 ug/min via INTRAVENOUS
  Filled 2024-06-27 (×7): qty 250

## 2024-06-27 MED ORDER — METRONIDAZOLE 500 MG/100ML IV SOLN: 500.0000 mg | Freq: Once | INTRAVENOUS | Status: DC

## 2024-06-27 MED ORDER — ORAL CARE MOUTH RINSE
15.0000 mL | OROMUCOSAL | Status: DC | PRN
Start: 2024-06-27 — End: 2024-06-29

## 2024-06-27 MED ORDER — HEPARIN SODIUM (PORCINE) 5000 UNIT/ML IJ SOLN: 5000.0000 [IU] | Freq: Three times a day (TID) | INTRAMUSCULAR | Status: DC

## 2024-06-27 MED ORDER — ETOMIDATE 2 MG/ML IV SOLN
INTRAVENOUS | Status: AC | PRN
  Administered 2024-06-27: 10 mg via INTRAVENOUS

## 2024-06-27 MED ORDER — FENTANYL 2500MCG IN NS 250ML (10MCG/ML) PREMIX INFUSION
0.0000 ug/h | INTRAVENOUS | Status: DC
  Administered 2024-06-27: 50 ug/h via INTRAVENOUS
  Administered 2024-06-28: 20 ug/h via INTRAVENOUS
  Filled 2024-06-27: qty 250

## 2024-06-27 MED ORDER — THIAMINE HCL 100 MG/ML IJ SOLN
100.0000 mg | Freq: Every day | INTRAMUSCULAR | Status: DC
  Administered 2024-06-27 – 2024-07-05 (×9): 100 mg via INTRAVENOUS
  Filled 2024-06-27 (×9): qty 2

## 2024-06-27 MED ORDER — DEXTROSE 50 % IV SOLN
INTRAVENOUS | Status: AC
  Filled 2024-06-27: qty 50

## 2024-06-27 MED ORDER — SODIUM CHLORIDE 0.9 % IV SOLN: 250.0000 mL | INTRAVENOUS | Status: AC

## 2024-06-27 NOTE — Progress Notes (Signed)
 Elink following for sepsis protocol.

## 2024-06-27 NOTE — Progress Notes (Signed)
 Pt transported from ED to CT and then back to ED without complications

## 2024-06-27 NOTE — Progress Notes (Signed)
 Transported pt from ED TRA C to 3M04 with bedside RN. Vitals are stable.

## 2024-06-27 NOTE — Progress Notes (Signed)
 Critical care attending attestation note:   I agree with the Advanced Practitioner's note, impression, and recommendations as outlined. I have taken an independent interval history, reviewed the chart and examined the patient. The following reflects my medical decision making and independent critical care time    Summary of Assessment and Plan   83 year old male with past medical history of CKD stage IV, diabetes mellitus type 2, hypertension, right thalamic ICH with left-sided weakness, Parkinson's disease who had a recent admission for a cecal mass which was adenocarcinoma status post right hemicolectomy on 9/2. He presents with altered mental status.  He was found to be hypoglycemic.  Code stroke was called.  CT head was negative.  Neurology is getting an EEG. Reportedly while the code stroke was called patient was hypertensive with SBP in 50s though no such blood pressure documented in the chart.. CT chest abdomen pelvis was done which did not show any acute changes in the abdomen.  Chest showed bilateral pleural effusion with left sided consolidation. He was not protecting his airway and hence he was intubated in the emergency room.  He is currently on Levophed  at 8 mcg. Intubated on PRVC.  Plateau 19.  PEEP 5.  Pertinent Physical Exam:  General: Elderly male who is sedated and intubated. Lungs: clear to auscultation bilaterally.  Heart: regular rate rhythm, no murmur appreciated.  Abdomen: non tender, non distended. Normal BS.  Has scar which appears healing well. Neuro: Sedated and intubated.  Pupils pinpoint. Right foot with chronic looking ulcer does not appear infected.  Labs and Radiology reviewed.  A/P:  Altered mental status: Shock, presumed sepsis: Likely bacterial pneumonia: - POCUS showed dilated IVC.  Status post 2 L IV fluids in the ER. - broad spectrum abx with vanc and zosyn .  - strep legionella and MRSA check.  - blood cultures.  - UA and Ucx when able. Unable to  pass catheter currently as the penile implant is inflated and unable to deflate it. Urology is being called in the ER.  - Foot Xray to r/o osteo  Mechanical ventilation: - Intubated for airway protection. - LT VV.  - Daily SAT SBT.  Low Hb: Hx of chronic normocytic anemia.  Hemoglobin was 9.4 on arrival.  I-STAT showing hemoglobin of 6.1 30 minutes later without bleeding.  Likely an error.  Will recheck.  Rest of the plan per APP note.  CRITICAL CARE Performed by: Sammi JONETTA Fredericks.     Total critical care time: 35 minutes   Critical care time was exclusive of separately billable procedures and treating other patients.   Critical care was necessary to treat or prevent imminent or life-threatening deterioration.   Critical care was time spent personally by me on the following activities: development of treatment plan with patient and/or surrogate as well as nursing, discussions with consultants, evaluation of patient's response to treatment, examination of patient, obtaining history from patient or surrogate, ordering and performing treatments and interventions, ordering and review of laboratory studies, ordering and review of radiographic studies, pulse oximetry, re-evaluation of patient's condition and participation in multidisciplinary rounds.  Sammi JONETTA Fredericks, MD Pulmonary, Critical Care and Sleep Attending.  Pager: [phone]  06/27/2024, 4:45 PM

## 2024-06-27 NOTE — Progress Notes (Signed)
 eLink Physician-Brief Progress Note Patient Name: Jake Bartlett DOB: 05/05/1941 MRN: [id number]   Date of Service  06/27/2024  HPI/Events of Note  BP remains labile.  Patient on max dose peripheral levophed .  Wife reiterated to me she did not want [name] to have any more invasive procedures including CVC.  She understands this means we may not be able to maintain adequate bp which could result in worsening of his serious condition.    eICU Interventions  DNR.  No further escalation of care        CLAUDENE AGENT, P 06/27/2024, 11:15 PM

## 2024-06-27 NOTE — Code Documentation (Addendum)
 Stroke Response Nurse Documentation Code Documentation  Code stroke activated by ED staff after patient arrived unresponsive requiring BVM en route. EMS called to SNF where patient was treated for hypoglycemia. S/p 2 days partial colectomy. Patient intubated upon arrival to ED due to noted left sided deficits. LKW 1000 today.   Stroke team met patient in ED bay upon activation, delay getting to scanner due to hypotension SBP 50s, requiring stabilization. IVF bolus and levo gtt started. NIH 33, see flowsheet for details. NIH Pt taken to CT, CTA head/neck, CTA chest/abd/pelvis and CT chest completed. No TNK due to recent surgery non compressible site. No LVO. Care Plan: q2h NIH and vitals x12h then per unit routine. Bedside handoff with ED RN Chloe.    Addendum: 1640 code stroke cancelled  Tonna Lacks K  Rapid Response RN

## 2024-06-27 NOTE — IPAL (Signed)
  Interdisciplinary Goals of Care Family Meeting   Date carried out: 06/27/2024  Location of the meeting: Conference room  Member's involved: Nurse Practitioner and Family Member or next of kin  Durable Power of Attorney or Environmental health practitioner: wife    Discussion: We discussed goals of care for [name].  Wife and two sons present and updated.  Share that pt has had a significant decline over months, has not ate well in months, hospitalized with hip surgery in June and then recent hospitalization with hemicolectomy dx with colon adenocarcinoma.  Since being at SNF, he has only been OOB once, very weak, choked at times with PO intake and not letting staff treat his wounds. Relayed concern for his ability to overcome this critical illness given his frailty prior to. They want him to be comfortable most importantly.  At this time do not want CPR/ ACLS measures.  Will continue with intubation and supportive measures.  They need to discuss privately about placement of CVL/ Aline.    Code status:   Code Status: Limited: Do not attempt resuscitation (DNR) -DNR-LIMITED -Do Not Intubate/DNI    Disposition: Continue current acute care  Time spent for the meeting: 20 mins    Jake Pesa, NP  06/27/2024, 6:34 PM

## 2024-06-27 NOTE — ED Provider Notes (Addendum)
 [place] [facility] DE[facility]MENT AT [facility] Provider Note   CSN: [id number] Arrival date & time: 06/27/24  1450     Patient presents with: hypoglycemia/bagging and Code Stroke   Jake Bartlett is a 83 y.o. male.   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  The history provided by EMS.  They state that they presented to the patient's facility, reported last known normal at 10 AM, on arrival, the patient was minimally responsive and was found to be hypoglycemic and was administered D50. EMS reported glucose in the 20s and gave 1 amp of D50 with repeat glucose 50 then glucose over 300 prior to arrival.  PT full code per EMS.  On arrival, the patient was GCS 7 (2-1-4), actively being bagged due to minimal responsiveness, pupils 4 mm bilaterally and reactive. CBG 114 on arrival. He was emergently intubated for poor GCS.  LKW at 1000 today.  Prior to Admission medications   Medication Sig Start Date End Date Taking? Authorizing Provider  acetaminophen  (TYLENOL ) 500 MG tablet Take 500-1,000 mg by mouth every 8 (eight) hours as needed for mild pain (pain score 1-3) (or headaches).    [provider]  B Complex Vitamins (VITAMIN B COMPLEX) TABS Take 1 tablet by mouth daily.    [provider]  carbidopa -levodopa  (SINEMET  IR) 25-250 MG tablet Take 1 tablet by mouth in the morning, at noon, and at bedtime. 01/09/22   [provider]  carvedilol (COREG) 3.125 MG tablet Take 3.125 mg by mouth in the morning.    [provider]  Cholecalciferol  (VITAMIN D3 PO) Take 1 capsule by mouth daily.    [provider]  escitalopram  (LEXAPRO ) 10 MG tablet Take 10 mg by mouth daily.     [provider]  feeding supplement (ENSURE PLUS HIGH PROTEIN) LIQD Take 237 mLs by mouth 2 (two) times daily between meals. 06/25/24   Jillian Buttery, MD  folic acid  (FOLVITE ) 1 MG tablet Take 1 mg by mouth daily. 11/23/21   [provider]  Multiple Vitamin (MULTIVITAMIN) tablet Take 1 tablet by mouth daily with breakfast.    [provider]  NOVOLIN 70/30 KWIKPEN (70-30) 100 UNIT/ML KwikPen Inject 10-20 Units into the skin See admin instructions. Inject 20 units into the skin in the morning and 10 units at bedtime    [provider]  Nystatin (GERHARDT'S BUTT CREAM) CREA Apply 1 Application topically 3 (three) times daily. 06/25/24   Jillian Buttery, MD  oxyCODONE  (OXY IR/ROXICODONE ) 5 MG immediate release tablet Take 1 tablet (5 mg total) by mouth every 4 (four) hours as needed for moderate pain (pain score 4-6). 06/25/24   Jillian Buttery, MD  pramipexole  (MIRAPEX ) 0.25 MG tablet Take 0.25 mg by mouth at bedtime.    [provider]  rosuvastatin  (CRESTOR ) 10 MG tablet Take 10 mg by mouth at bedtime. 01/16/22   [provider]  sodium bicarbonate  650 MG tablet Take 1 tablet (650 mg total) by mouth 3 (three) times daily. 06/25/24   Jillian Buttery, MD  tamsulosin  (FLOMAX ) 0.4 MG CAPS capsule Take 1 capsule (0.4 mg total) by mouth daily. Patient not taking: Reported on 06/16/2024 02/24/22   Towana Ozell BROCKS, MD    Allergies: Ropinirole and  Gabapentin    Review of Systems  Unable to perform ROS: Acuity of condition    Updated Vital Signs BP (!) 83/59 (BP Location: Right Arm)   Pulse 65   Temp (!) 96.9 F (36.1 C)   Resp 16   SpO2 99%   Physical Exam Vitals and nursing note reviewed.  Constitutional:      General: He is in acute distress.     Appearance: He is ill-appearing.     Comments: GCS 7 (2-1-4), actively being bagged  HENT:     Head: Normocephalic and atraumatic.     Mouth/Throat:     Mouth: Mucous membranes are dry.  Eyes:      Conjunctiva/sclera: Conjunctivae normal.     Pupils: Pupils are equal, round, and reactive to light.  Cardiovascular:     Rate and Rhythm: Normal rate and regular rhythm.  Pulmonary:     Breath sounds: Rhonchi present.     Comments: Actively being resuscitated by BVM Abdominal:     General: There is no distension.     Tenderness: There is no guarding.     Comments: Post operative site along the abdomen appears c/d/i  Musculoskeletal:        General: No deformity or signs of injury.     Cervical back: Neck supple.     Comments: Bilat LE in moon boots  Skin:    Findings: No lesion or rash.  Neurological:     Comments: GCS 7 (2-1-4), withdraws to painful stimuli bilaterally in the upper extremities, pupils 3-78mm bilat and reactive but sluggish, apparent LUE weakness compared to RUE, minimal withdrawal to LE bilat      (all labs ordered are listed, but only abnormal results are displayed) Labs Reviewed  COMPREHENSIVE METABOLIC PANEL WITH GFR - Abnormal; Notable for the following components:      Result Value   Sodium 134 (*)    Potassium 3.3 (*)    Glucose, Bld 106 (*)    BUN 26 (*)    Creatinine, Ser 2.60 (*)    Calcium  7.1 (*)    Total Protein 4.6 (*)    Albumin  <1.5 (*)    GFR, Estimated 24 (*)    All other components within normal limits  CBC WITH DIFFERENTIAL/PLATELET - Abnormal; Notable for the following components:   WBC 24.6 (*)    RBC 3.03 (*)    Hemoglobin 9.5 (*)    HCT 29.9 (*)    RDW 17.6 (*)    Platelets 503 (*)    Neutro Abs 21.6 (*)    Monocytes Absolute 1.1 (*)    Abs Immature Granulocytes 0.25 (*)    All other components within normal limits  CBG MONITORING, ED - Abnormal; Notable for the following components:   Glucose-Capillary 114 (*)    All other components within normal limits  I-STAT ARTERIAL BLOOD GAS, ED - Abnormal; Notable for the following components:   pCO2 arterial 31.2 (*)    pO2, Arterial 439 (*)    Bicarbonate 17.7 (*)    TCO2 19 (*)     Acid-base deficit 7.0 (*)    Potassium 2.1 (*)    Calcium , Ion 1.07 (*)    HCT 18.0 (*)    Hemoglobin 6.1 (*)    All other components within normal limits  I-STAT CHEM 8, ED - Abnormal; Notable for the following components:   Potassium 3.4 (*)    BUN 29 (*)    Creatinine, Ser 2.70 (*)  Glucose, Bld 104 (*)    Calcium , Ion 1.06 (*)    TCO2 21 (*)    Hemoglobin 9.5 (*)    HCT 28.0 (*)    All other components within normal limits  CULTURE, BLOOD (ROUTINE X 2)  CULTURE, BLOOD (ROUTINE X 2)  MRSA NEXT GEN BY PCR, NASAL  CULTURE, RESPIRATORY W GRAM STAIN  AMMONIA  GLUCOSE, CAPILLARY  RAPID URINE DRUG SCREEN, HOSP PERFORMED  ETHANOL  URINALYSIS, W/ REFLEX TO CULTURE (INFECTION SUSPECTED)  STREP PNEUMONIAE URINARY ANTIGEN  HEMOGLOBIN AND HEMATOCRIT, BLOOD  CBC  COMPREHENSIVE METABOLIC PANEL WITH GFR  MAGNESIUM   TSH  BASIC METABOLIC PANEL WITH GFR  LEGIONELLA PNEUMOPHILA SEROGP 1 UR AG  CBG MONITORING, ED  I-STAT CG4 LACTIC ACID, ED  TYPE AND SCREEN    EKG: EKG Interpretation Date/Time:  Sunday June 27 2024 14:56:51 EDT Ventricular Rate:  90 PR Interval:  185 QRS Duration:  93 QT Interval:  399 QTC Calculation: 489 R Axis:   17  Text Interpretation: Sinus rhythm Atrial premature complex Low voltage, extremity leads Anteroseptal infarct, old Nonspecific T abnormalities, lateral leads Confirmed by Jerrol Agent ([id number]) on 06/27/2024 4:48:54 PM  Radiology: [facility]A Foot 2 Views Right Result Date: 06/27/2024 CLINICAL DATA:  Osteomyelitis EXAM: RIGHT FOOT - 2 VIEW COMPARISON:  06/16/2024 FINDINGS: Frontal and lateral views of the right foot are obtained. There are no acute displaced fractures. No evidence of bony destruction or periosteal reaction to suggest osteomyelitis. Soft tissue defect within the dorsal aspect of the hindfoot is again seen, unchanged since prior exam. Mild diffuse osteoarthritis. Small inferior calcaneal spur. IMPRESSION: 1. Dorsal soft tissue defect  overlying the dorsal margin of the calcaneus. No evidence of underlying bony destruction or periosteal reaction to suggest osteomyelitis. 2. Mild osteoarthritis. Electronically Signed   By: Ozell Daring M.D.   On: 06/27/2024 17:51   DG Abdomen 1 View Result Date: 06/27/2024 CLINICAL DATA:  Enteric catheter placement EXAM: ABDOMEN - 1 VIEW COMPARISON:  06/27/2024 FINDINGS: Single frontal view of the chest and upper abdomen was obtained. Endotracheal tube overlies tracheal air column tip at thoracic inlet. Enteric catheter is identified, with tip and side port likely within the gastric fundus, which is located in a retrocardiac location due to elevation of the left hemidiaphragm. Cardiac silhouette is unremarkable. Small bilateral pleural effusions with patchy bibasilar consolidation again noted unchanged. Distended gas-filled loops of large and small bowel are again seen throughout the abdomen, unchanged since earlier CT. IMPRESSION: 1. Enteric catheter tip and side port likely within the gastric lumen beneath an elevated left hemidiaphragm. 2. Small bilateral pleural effusions and bibasilar lung consolidation, unchanged. 3. Stable diffuse gaseous distension of the large and small bowel. Electronically Signed   By: Ozell Daring M.D.   On: 06/27/2024 17:49   CT Angio Chest/Abd/Pel for Dissection W and/or W/WO Addendum Date: 06/27/2024 ADDENDUM REPORT: 06/27/2024 16:53 ADDENDUM: Critical Value/emergent results were called by telephone at the time of interpretation on 06/27/2024 at 4:52 pm to provider Dr. Jakie, who verbally acknowledged these results. Electronically Signed   By: Leita Birmingham M.D.   On: 06/27/2024 16:53   Result Date: 06/27/2024 CLINICAL DATA:  Dissection. EXAM: CT ANGIOGRAPHY CHEST, ABDOMEN AND PELVIS TECHNIQUE: Non-contrast CT of the chest was initially obtained. Multidetector CT imaging through the chest, abdomen and pelvis was performed using the standard protocol during bolus  administration of intravenous contrast. Multiplanar reconstructed images and MIPs were obtained and reviewed to evaluate the vascular anatomy. RADIATION DOSE REDUCTION:  This exam was performed according to the departmental dose-optimization program which includes automated exposure control, adjustment of the mA and/or kV according to patient size and/or use of iterative reconstruction technique. CONTRAST:  OMNIPAQUE  IOHEXOL  350 MG/ML SOLN COMPARISON:  06/21/2024, 06/16/2024. FINDINGS: CTA CHEST FINDINGS Cardiovascular: The heart is enlarged and there is a trace pericardial effusion. Three-vessel coronary artery calcification is present. There is atherosclerotic calcification the aorta without evidence of aneurysm. No dissection is seen. The pulmonary trunk is normal in caliber. Mediastinum/Nodes: No mediastinal, hilar, or axillary lymphadenopathy is seen. The thyroid gland is within normal limits. An endotracheal tube and enteric tube are noted. The enteric tube terminates at the level of the gastroesophageal junction and should be advanced. Lungs/Pleura: There are small bilateral pleural effusions, slightly increased in size from the prior exam. Bronchial wall thickening is present in the lungs bilaterally with patchy airspace disease and consolidation, most pronounced in the left lower lobe. No pneumothorax is seen. A stable 3 mm nodule is noted in the right upper lobe, axial image 21. Musculoskeletal: Degenerative changes are present in the thoracic spine. No acute osseous abnormality is seen. Review of the MIP images confirms the above findings. CTA ABDOMEN AND PELVIS FINDINGS VASCULAR Aorta: Normal caliber aorta without aneurysm, dissection, vasculitis or significant stenosis. Aortic atherosclerosis. Celiac: Patent without evidence of aneurysm, dissection, or vasculitis. There is atherosclerotic calcification of the origin resulting in severe stenosis. SMA: Patent without evidence of aneurysm, dissection,  or significant stenosis. Atherosclerotic calcification with mural thrombus. There is mild fat stranding about the proximal SMA, axial images 141 through 145. Renals: Both renal arteries are patent without evidence of aneurysm, dissection, vasculitis, fibromuscular dysplasia or significant stenosis. IMA: Patent without evidence of aneurysm, dissection, vasculitis or significant stenosis. Inflow: Patent without evidence of aneurysm, dissection, vasculitis or significant stenosis. Veins: No obvious venous abnormality within the limitations of this arterial phase study. Extensive reflux of contrast is noted in the inferior vena cava. Review of the MIP images confirms the above findings. NON-VASCULAR Hepatobiliary: There is a cyst in the left lobe of the liver. Additional subcentimeter hypodensity is noted which is too small to further characterize. Hypervascular focus is present in the left lobe of the liver measuring 1.6 cm, possible flash filling hemangioma. Stones are present within the gallbladder. No biliary ductal dilatation. Pancreas: Unremarkable. No pancreatic ductal dilatation or surrounding inflammatory changes. Spleen: Normal in size without focal abnormality. Adrenals/Urinary Tract: The adrenal glands are within normal limits. The kidneys enhance symmetrically. A stable exophytic cyst with rim calcification is noted in the posterior aspect of the left kidney. No renal calculus or hydronephrosis bilaterally. The visualized portion of the urinary bladder is within normal limits. Stomach/Bowel: The stomach is within normal limits. Free air is noted in the abdomen and anterior abdominal wall in the midline and on the left. A mildly distended loop of small bowel is present in the pelvis measuring up to 3.6 cm with no definite transition point. There is thickening of the walls of the rectosigmoid colon with surrounding inflammatory changes. Surgical changes with cecal resection and neo terminal ileum anastomotic  site are new from the previous exam. The appendix is not seen. Lymphatic: No abdominal or pelvic lymphadenopathy. Reproductive: Prostate gland is not seen due to streak artifact. A penile prosthesis is noted with reservoir anterior to the bladder. Other: Mild fat stranding is noted in the presacral space and mesentery. A trace amount of ascites is noted in the right pericolic gutter and pelvis.  Musculoskeletal: Degenerative changes are present in the lumbar spine. Total hip arthroplasty changes are present on the right. No acute osseous abnormality. Review of the MIP images confirms the above findings. IMPRESSION: 1. Aortic atherosclerosis without evidence of aneurysm or dissection. 2. Postsurgical changes with resection of the cecum and neo terminal ileum anastomosis, new from the previous exam. Moderate amount of free air in the abdomen, likely postsurgical. 3. Mildly distended loops of small bowel in the abdomen without evidence of transition point, possible ileus versus are earlier possible extraction. 4. Segmental thickening of the colonic and colonic wall thickening involving the sigmoid colon and rectum, which may be infectious or inflammatory. 5. Bronchial wall thickening with areas of mucous plugging at the lung bases and basilar consolidation, concerning for infection. 6. Small bilateral pleural effusions, slightly increased from the prior exam. 7. Enteric tube terminates at the gastroesophageal junction and should be advanced. 8. Cholelithiasis. 9. Coronary artery calcifications. Electronically Signed: By: Leita Birmingham M.D. On: 06/27/2024 16:37   CT ANGIO HEAD NECK W WO CM (CODE STROKE) Result Date: 06/27/2024 CLINICAL DATA:  Neuro deficit, acute, stroke suspected. Hypoglycemia and unresponsiveness. EXAM: CT ANGIOGRAPHY HEAD AND NECK WITH AND WITHOUT CONTRAST TECHNIQUE: Multidetector CT imaging of the head and neck was performed using the standard protocol during bolus administration of intravenous  contrast. Multiplanar CT image reconstructions and MIPs were obtained to evaluate the vascular anatomy. Carotid stenosis measurements (when applicable) are obtained utilizing NASCET criteria, using the distal internal carotid diameter as the denominator. RADIATION DOSE REDUCTION: This exam was performed according to the departmental dose-optimization program which includes automated exposure control, adjustment of the mA and/or kV according to patient size and/or use of iterative reconstruction technique. CONTRAST:  OMNIPAQUE  IOHEXOL  350 MG/ML SOLN COMPARISON:  MRA head and neck 04/12/2022 FINDINGS: CTA NECK FINDINGS Aortic arch: Normal variant aortic arch branching pattern with common origin of the brachiocephalic and left common carotid arteries. Patent brachiocephalic and subclavian arteries with mild atherosclerosis not resulting in significant stenosis. Right carotid system: Patent with a small amount of calcified and soft plaque in the proximal ICA. No evidence of a significant stenosis or dissection. Left carotid system: Patent with a small amount of calcified and soft plaque at the carotid bifurcation and in the proximal ICA. No evidence of a significant stenosis or dissection. Vertebral arteries: Patent. No evidence of a significant stenosis or dissection of the left vertebral artery which is strongly dominant. Diffusely hypoplastic right vertebral artery. Skeleton: Advanced disc and facet degeneration in the cervical and included upper thoracic spine. Vertebral body and facet ankylosis at C4-5 and vertebral body ankylosis at C5-6. Advanced multilevel neural foraminal stenosis. 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 No evidence of cervical lymphadenopathy. Endotracheal and enteric tubes course through the neck. Upper chest: More fully evaluated on the separately reported CTA of the chest, abdomen, and pelvis. Review of the MIP images  confirms the above findings CTA HEAD FINDINGS Evaluation of the small and medium-sized vessels is limited by early contrast timing. Anterior circulation: The internal carotid arteries are patent from skull base to carotid termini are patent from skull base to carotid termini with new mild proximal supraclinoid stenoses bilaterally. The M1 segments and proximal M2 trunks are patent bilaterally without evidence of a significant M1 stenosis. MCA branch vessels are poorly evaluated. The ACAs are patent with new mild diffuse irregular narrowing of the left A1 segment and more  severe diffuse irregular narrowing of the right A1 segment. No aneurysm is identified. Posterior circulation: The intracranial left vertebral artery is patent and supplies the basilar, and there is up to mild left V4 stenosis. The intracranial right vertebral artery is small and poorly visualized but appears grossly patent through the PICA origin. Patent SCA origins are visualized bilaterally. The basilar artery is patent with minimal to mild narrowing in its mid and distal portions. Posterior communicating arteries are diminutive or absent. The PCAs are patent proximally without evidence of a significant proximal stenosis on the right. A severe left PCA stenosis near the P1-P2 junction is new. PCA branch vessels are poorly evaluated. No aneurysm is identified. Venous sinuses: Not evaluated due to early contrast timing. Anatomic variants: Hypoplastic right vertebral artery. Review of the MIP images confirms the above findings The preliminary finding of no emergent large vessel occlusion was communicated to Dr. Nichola at 4:02 pm on 06/27/2024 by text page via the [facility] messaging system. IMPRESSION: 1. No emergent large vessel occlusion. Suboptimal assessment of the intracranial branch vessels. 2. 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 3. Mild cervical carotid artery atherosclerosis without stenosis. 4.  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 5.  Aortic Atherosclerosis (ICD10-I70.0). Electronically Signed   By: Dasie Hamburg M.D.   On: 06/27/2024 16:46   CT HEAD CODE STROKE WO CONTRAST Result Date: 06/27/2024 CLINICAL DATA:  Code stroke. Neuro deficit, acute, stroke suspected. Hypoglycemia and unresponsiveness. EXAM: CT HEAD WITHOUT CONTRAST TECHNIQUE: Contiguous axial images were obtained from the base of the skull through the vertex without intravenous contrast. RADIATION DOSE REDUCTION: This exam was performed according to the departmental dose-optimization program which includes automated exposure control, adjustment of the mA and/or kV according to patient size and/or use of iterative reconstruction technique. COMPARISON:  Head CT and MRI 04/12/2022 FINDINGS: Brain: There is no evidence of an acute infarct, intracranial hemorrhage, mass, midline shift, or extra-axial fluid collection. There is moderate cerebral atrophy. A small focus of encephalomalacia is present in the right thalamus at the site of the prior hemorrhage. Cerebral white matter hypodensities are nonspecific but compatible with mild chronic small vessel ischemic disease. There is moderate cerebral atrophy. Vascular: Calcified atherosclerosis at the skull base. No hyperdense vessel. Skull: No acute fracture or suspicious lesion. Sinuses/Orbits: Visualized paranasal sinuses and mastoid air cells are clear. Bilateral cataract extraction. Other: None. ASPECTS ([place] Stroke Program Early CT Score) - Ganglionic level infarction (caudate, lentiform nuclei, internal capsule, insula, M1-M3 cortex): 7 - Supraganglionic infarction (M4-M6 cortex): 3 Total score (0-10 with 10 being normal): 10 These results were communicated to Dr. Nichola at 3:40 pm on 06/27/2024 by text page via the [facility] messaging system. IMPRESSION: 1. No evidence of acute intracranial abnormality. ASPECTS  of 10. 2. 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 Electronically Signed   By: Dasie Hamburg M.D.   On: 06/27/2024 15:55   DG Chest Port 1 View Result Date: 06/27/2024 CLINICAL DATA:  Altered mental status EXAM: PORTABLE CHEST 1 VIEW COMPARISON:  Chest radiograph dated 06/16/2024 FINDINGS: Lines/tubes: Endotracheal tube tip projects 3.7 cm above the carina. Lungs: Low lung volumes with bronchovascular crowding. Diffuse perihilar interstitial opacities with more focal linear opacities in the left mid and right lower lungs. Dense left retrocardiac opacity. Pleura: Layering bilateral pleural effusions.  No pneumothorax. Heart/mediastinum: Enlarged cardiomediastinal silhouette is likely projectional. Bones:  No acute osseous abnormality. IMPRESSION: 1. Endotracheal tube tip projects 3.7 cm above the carina. 2. Diffuse perihilar interstitial opacities with more focal linear opacities in the left mid and right lower lungs, likely a combination of atelectasis and pulmonary edema. 3. Dense left retrocardiac opacity, likely atelectasis. Aspiration or pneumonia can be considered in the appropriate clinical setting. 4. Layering bilateral pleural effusions. Electronically Signed   By: Limin  Xu M.D.   On: 06/27/2024 15:39     .Critical Care  Performed by: Jerrol Agent, MD Authorized by: Jerrol Agent, MD   Critical care provider statement:    Critical care time (minutes):  60   Critical care was necessary to treat or prevent imminent or life-threatening deterioration of the following conditions:  Shock and respiratory failure   Critical care was time spent personally by me on the following activities:  Development of treatment plan with patient or surrogate, discussions with consultants, evaluation of patient's response to treatment, examination of patient, ordering and review of laboratory studies, ordering and review of radiographic studies, ordering and performing treatments and  interventions, pulse oximetry, re-evaluation of patient's condition and review of old charts   Care discussed with: admitting provider   Procedure Name: Intubation Date/Time: 06/27/2024 2:49 PM  Performed by: Jerrol Agent, MDPre-anesthesia Checklist: Patient identified, Patient being monitored, Emergency Drugs available, Timeout performed and Suction available Oxygen Delivery Method: Non-rebreather mask Preoxygenation: Pre-oxygenation with 100% oxygen Induction Type: Rapid sequence Ventilation: Mask ventilation without difficulty Laryngoscope Size: Glidescope and 3 Grade View: Grade I Tube size: 7.5 mm Number of attempts: 1 Placement Confirmation: ETT inserted through vocal cords under direct vision, CO2 detector and Breath sounds checked- equal and bilateral Secured at: 23 cm Tube secured with: ETT holder       Medications Ordered in the ED  fentaNYL  (SUBLIMAZE ) injection 25-50 mcg (has no administration in time range)  fentaNYL  in NS (72mcg/ml) infusion-PREMIX (50 mcg/hr Intravenous New Bag/Given 06/27/24 1654)  fentaNYL  (SUBLIMAZE ) bolus via infusion 25-100 mcg (has no administration in time range)  vancomycin  (VANCOREADY) IVPB 1500 mg/300 mL (1,500 mg Intravenous New Bag/Given 06/27/24 1822)  Chlorhexidine  Gluconate Cloth 2 % PADS 6 each (6 each Topical Given 06/27/24 1829)  Oral care mouth rinse (15 mLs Mouth Rinse Given 06/27/24 1800)  Oral care mouth rinse (has no administration in time range)  pantoprazole  (PROTONIX ) injection 40 mg (40 mg Intravenous Given 06/27/24 1842)  0.9 %  sodium chloride  infusion (has no administration in time range)  norepinephrine  (LEVOPHED ) 4mg  in (0.016 mg/mL) premix infusion (4 mcg/min Intravenous New Bag/Given 06/27/24 1659)  thiamine  (VITAMIN B1) injection 100 mg (100 mg Intravenous Given 06/27/24 1823)  piperacillin -tazobactam (ZOSYN ) IVPB 3.375 g (3.375 g Intravenous Incomplete 06/27/24 1807)  vancomycin  (VANCOREADY) IVPB 1250 mg/250 mL  (has no administration in time range)  calcium  gluconate 1 g/ 50 mL sodium chloride  IVPB (1,000 mg Intravenous New Bag/Given 06/27/24 1830)  iohexol  (OMNIPAQUE ) 350 MG/ML injection 100 mL (100 mLs Intravenous Contrast Given 06/27/24 1550)  etomidate  (AMIDATE ) injection (10 mg Intravenous Given 06/27/24 1500)  rocuronium  (ZEMURON ) injection (80 mg Intravenous Given 06/27/24 1500)  lactated ringers  bolus 1,000 mL (1,000 mLs Intravenous New Bag/Given 06/27/24 1657)  lactated ringers  bolus 1,000 mL (1,000 mLs Intravenous New Bag/Given 06/27/24 1657)  lactated ringers  bolus 1,000 mL (1,000 mLs Intravenous New Bag/Given 06/27/24 1658)  potassium chloride  (KLOR-CON ) packet 40 mEq (40 mEq Per Tube Given 06/27/24 1847)  Medical Decision Making Amount and/or Complexity of Data Reviewed Labs: ordered. Radiology: ordered.  Risk Prescription drug management. Decision regarding hospitalization.   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  The history provided by EMS.  They state that they presented to the patient's facility, reported last known normal at 10 AM, on arrival, the patient was minimally responsive and was found to be hypoglycemic and was administered D50. EMS reported glucose in the 20s and gave 1 amp of D50 with repeat glucose 50 then glucose over 300 prior to arrival.  PT full code per EMS.  On arrival, the patient was GCS 7 (2-1-4), actively being bagged due to minimal responsiveness, pupils 4 mm bilaterally and reactive. CBG 114 on arrival. He was emergently intubated for poor GCS.  LKW at 1000 today.  On arrival, the patient was not tachycardic heart rate 95, actively being bagged, initial BP 105/74,  saturating 100% while being bagged.  Patient with pupils that were 3 to 4 mm and reactive to light. Emergently intubated for poor GCS. Considered CVA, ICH, electrolyte abnormality, seizure, infectious etiology, other toxic metabolic derangement. With ICH consideration and potentially L sided deficit and unresponsiveness, code stroke activated and neurology arrived bedside post intubation. Postintubation, the patient experienced a period of postintubation hypotension SBP 50s requiring pressure bag 2 L IV fluids and administration of vasopressors.  His blood pressure improved, he remained on Levophed . Regarding post intubation hypotension, considered aortic aneurysm, aortic dissection, perforated bowel, septic shock although patient not tachycardic or hypotension on initial arrival. Considered hypovolemic shock in setting of recent GI surgery. CXR post intubation showed ETT in good position.   CXR: IMPRESSION:  1. Endotracheal tube tip projects 3.7 cm above the carina.  2. Diffuse perihilar interstitial opacities with more focal linear  opacities in the left mid and right lower lungs, likely a  combination of atelectasis and pulmonary edema.  3. Dense left retrocardiac opacity, likely atelectasis. Aspiration  or pneumonia can be considered in the appropriate clinical setting.  4. Layering bilateral pleural effusions.      CT Head: IMPRESSION:  1. No evidence of acute intracranial abnormality. ASPECTS of 10.  2. Mild chronic small vessel ischemic disease and moderate cerebral  atrophy.   CTA Head and Neck: IMPRESSION:  1. No emergent large vessel occlusion. Suboptimal assessment of the  intracranial branch vessels.  2. Intracranial atherosclerosis including severe right A1 and left  P1-P2 junction stenoses.  3. Mild cervical carotid artery atherosclerosis without stenosis.  4. 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  5.  Aortic Atherosclerosis (ICD10-I70.0).    CTA Dissection: IMPRESSION:  1. Aortic atherosclerosis without evidence of aneurysm or  dissection.  2. Postsurgical changes with resection of the cecum and neo terminal  ileum anastomosis, new from the previous exam. Moderate amount of  free air in the abdomen, likely postsurgical.  3. Mildly distended loops of small bowel in the abdomen without  evidence of transition point, possible ileus versus are earlier  possible extraction.  4. Segmental thickening of the colonic and colonic wall thickening  involving the sigmoid colon and rectum, which may be infectious or  inflammatory.  5. Bronchial wall thickening  with areas of mucous plugging at the  lung bases and basilar consolidation, concerning for infection.  6. Small bilateral pleural effusions, slightly increased from the  prior exam.  7. Enteric tube terminates at the gastroesophageal junction and  should be advanced.  8. Cholelithiasis.  9. Coronary artery calcifications.    Pt covered with broad spectrum ABX, laboratory evaluation revealed a leukocytosis 24.6, hemoglobin anemic to 9.5 postoperatively, initial lactic acid 1.2, urine pending, blood cultures ordered and pending, CMP with mild hypokalemia to 3.3, stable CKD with a creatinine of 2.6, BUN 26. Pt with post operative likely free air on CT, no dissection, post surgical changes noted, lung mucous plugging and basilar consolidation.   Given the patient's hypoxic respiratory failure, poor GCS requiring intubation, PCCM was consulted for admission.  Nursing notified of need to advance OG tube, patient stabilized on Levophed  at time of admission. EEG ordered by neuro, per neuro will need MRI Brain to evaluate for mets if able.       Final diagnoses:  Unresponsive episode  Acute respiratory failure with hypoxia (HCC)  Hypoglycemia  Hypotension, unspecified hypotension type  Shock [facility])    ED Discharge Orders      None              Jerrol Agent, MD 06/27/24 1909

## 2024-06-27 NOTE — Progress Notes (Signed)
 STAT EEG complete - results pending. ? ?

## 2024-06-27 NOTE — ED Triage Notes (Signed)
 Pt BIB [facility] from home d/t hypoglycemia and became unresponsive.

## 2024-06-27 NOTE — Consult Note (Addendum)
 NEUROLOGY CONSULT NOTE   Date of service: June 27, 2024 Patient Name: Jake Bartlett MRN:  [id number] DOB:  1940/11/08 Chief Complaint: Code Stroke Requesting Provider: Theodoro Lakes, MD  History of Present Illness  Jake Bartlett is a 83 y.o. male with hx of diabetes, high cholesterol, stroke, cecal mass with recent colon resection, UTI who presented via EMS after being found unresponsive at his skilled nursing facility. EMS reported glucose in the 20s and gave 1 amp of D50 with repeat glucose 50 then glucose 300 on arrival. BVM-> intubated by EDP. Code stroke activated for left sided deficits. Hx of ICH with left sided weakness that reportedly improved with no deficits. He was discharged 9/5 after colon resection for an adenocarcinoma.   LKW: 1000 Modified rankin score: 2-Slight disability-UNABLE to perform all activities but does not need assistance TNK: No outside of window.  No LVO.   ROS   Unable to assess due to altered mental status   Past History   Past Medical History:  Diagnosis Date   Diabetes mellitus without complication (HCC)    High cholesterol    Stroke [facility])     Past Surgical History:  Procedure Laterality Date   BACK SURGERY     BONE BIOPSY  06/20/2024   Procedure: BIOPSY, GI;  Surgeon: Saintclair Jasper, MD;  Location: [facility];  Service: Gastroenterology;;   COLONOSCOPY N/A 06/20/2024   Procedure: COLONOSCOPY;  Surgeon: Saintclair Jasper, MD;  Location: [facility];  Service: Gastroenterology;  Laterality: N/A;   HIP SURGERY     LAPAROSCOPIC PARTIAL COLECTOMY N/A 06/22/2024   Procedure: LAPAROSCOPIC RIGHT COLECTOMY;  Surgeon: Teresa Lonni HERO, MD;  Location: [facility];  Service: General;  Laterality: N/A;   PENILE PROSTHESIS IMPLANT  08/14/2006   [facility] Penile Prosthesis; Performed by MYRTIS Stalling MD    Family History: No family history on file.  Social History  reports that he has never smoked. He has never been exposed to tobacco smoke. He has never used  smokeless tobacco. He reports that he does not drink alcohol and does not use drugs.  Allergies  Allergen Reactions   Ropinirole Anaphylaxis, Swelling and Other (See Comments)    Angioedema   Gabapentin Other (See Comments)    Drowsiness- Made him extremely sleepy - even a low dose    Medications   Current Facility-Administered Medications:    0.9 %  sodium chloride  infusion, 250 mL, Intravenous, Continuous, Simpson, Paula B, NP   ceFEPIme (MAXIPIME) 2 g in sodium chloride  0.9 % 100 mL IVPB, 2 g, Intravenous, Once, Yolande Lamar BROCKS, MD   Chlorhexidine  Gluconate Cloth 2 % PADS 6 each, 6 each, Topical, Daily, Antonetta Moccasin B, NP   etomidate  (AMIDATE ) injection, , Intravenous, Code/Trauma/Sedation Med, Jerrol Agent, MD, 10 mg at 06/27/24 1500   fentaNYL  (SUBLIMAZE ) bolus via infusion 25-100 mcg, 25-100 mcg, Intravenous, Q15 min PRN, Jerrol Agent, MD   fentaNYL  (SUBLIMAZE ) injection 25-50 mcg, 25-50 mcg, Intravenous, Once, Jerrol Agent, MD   fentaNYL  in NS 250mL (80mcg/ml) infusion-PREMIX, 0-400 mcg/hr, Intravenous, Continuous, Jerrol Agent, MD, Last Rate: 5 mL/hr at 06/27/24 1654, 50 mcg/hr at 06/27/24 1654   heparin  injection 5,000 Units, 5,000 Units, Subcutaneous, Q8H, Simpson, Paula B, NP   metroNIDAZOLE  (FLAGYL ) IVPB 500 mg, 500 mg, Intravenous, Once, Yolande Lamar BROCKS, MD   norepinephrine  (LEVOPHED ) 4mg  in (0.016 mg/mL) premix infusion, 0-10 mcg/min, Intravenous, Titrated, Antonetta Moccasin B, NP, Last Rate: 15 mL/hr at 06/27/24 1659, 4 mcg/min at 06/27/24 1659   Oral  care mouth rinse, 15 mL, Mouth Rinse, Q2H, Simpson, Paula B, NP   Oral care mouth rinse, 15 mL, Mouth Rinse, PRN, Antonetta Moccasin B, NP   pantoprazole  (PROTONIX ) injection 40 mg, 40 mg, Intravenous, Daily, Antonetta Moccasin B, NP   potassium chloride  (KLOR-CON ) packet 40 mEq, 40 mEq, Per Tube, Once, Antonetta Moccasin NOVAK, NP   rocuronium  (ZEMURON ) injection, , Intravenous, Code/Trauma/Sedation Med, Jerrol Agent, MD, 80 mg at 06/27/24 1500   thiamine  (VITAMIN B1) injection 100 mg, 100 mg, Intravenous, Daily, Antonetta Moccasin B, NP   vancomycin  (VANCOREADY) IVPB 1500 mg/300 mL, 1,500 mg, Intravenous, Once, Yolande Lamar BROCKS, MD  Current Outpatient Medications:    acetaminophen  (TYLENOL ) 500 MG tablet, Take 500-1,000 mg by mouth every 8 (eight) hours as needed for mild pain (pain score 1-3) (or headaches)., Disp: , Rfl:    B Complex Vitamins (VITAMIN B COMPLEX) TABS, Take 1 tablet by mouth daily., Disp: , Rfl:    carbidopa -levodopa  (SINEMET  IR) 25-250 MG tablet, Take 1 tablet by mouth in the morning, at noon, and at bedtime., Disp: , Rfl:    carvedilol (COREG) 3.125 MG tablet, Take 3.125 mg by mouth in the morning., Disp: , Rfl:    Cholecalciferol  (VITAMIN D3 PO), Take 1 capsule by mouth daily., Disp: , Rfl:    escitalopram  (LEXAPRO ) 10 MG tablet, Take 10 mg by mouth daily., Disp: , Rfl:    feeding supplement (ENSURE PLUS HIGH PROTEIN) LIQD, Take 237 mLs by mouth 2 (two) times daily between meals., Disp: , Rfl:    folic acid  (FOLVITE ) 1 MG tablet, Take 1 mg by mouth daily., Disp: , Rfl:    Multiple Vitamin (MULTIVITAMIN) tablet, Take 1 tablet by mouth daily with breakfast., Disp: , Rfl:    NOVOLIN 70/30 KWIKPEN (70-30) 100 UNIT/ML KwikPen, Inject 10-20 Units into the skin See admin instructions. Inject 20 units into the skin in the morning and 10 units at bedtime, Disp: , Rfl:    Nystatin (GERHARDT'S BUTT CREAM) CREA, Apply 1 Application topically 3 (three) times daily., Disp: , Rfl:    oxyCODONE  (OXY IR/ROXICODONE ) 5 MG immediate release tablet, Take 1 tablet (5 mg total) by mouth every 4 (four) hours as needed for moderate pain (pain score 4-6)., Disp: 15 tablet, Rfl: 0   pramipexole  (MIRAPEX ) 0.25 MG tablet, Take 0.25 mg by mouth at bedtime., Disp: , Rfl:    rosuvastatin  (CRESTOR ) 10 MG tablet, Take 10 mg by mouth at bedtime., Disp: , Rfl:    sodium bicarbonate  650 MG tablet, Take 1 tablet (650 mg  total) by mouth 3 (three) times daily., Disp: , Rfl:    tamsulosin  (FLOMAX ) 0.4 MG CAPS capsule, Take 1 capsule (0.4 mg total) by mouth daily. (Patient not taking: Reported on 06/16/2024), Disp: 30 capsule, Rfl: 0  Vitals   Vitals:   06/27/24 1545 06/27/24 1600 06/27/24 1615 06/27/24 1629  BP: 107/69 98/69 110/66   Pulse: 65     Resp: 14 16 16    Temp:    (!) 96.9 F (36.1 C)  SpO2: 99%       There is no height or weight on file to calculate BMI.   Physical Exam   Constitutional: Ill appearing  Psych: Unresponsive Eyes: No scleral injection.  HENT: No OP obstruction.  Head: Normocephalic.  Cardiovascular: Afib on the monitor  Respiratory: Mechanically ventilated  GI: Soft.  No distension. Midline scar Skin: Midline scar   Neurologic Examination   Neuro: Mental Status: RSI given  Patient is  unresponsive, no verbal output Following does not follow commands Cranial Nerves: II: PERRL III,IV, VI: Eyes midline, does not track examiner VII: Facial movement is obscured by ETT VIII: Hearing is intact to voice X: Cough and gag intact XI: Head is midline XII: Tongue protrudes midline without atrophy or fasciculations.  Motor: Tone is normal. Bulk is poor No movement in extremities. Sensory: Localizes to painful stimuli.  EDP reports WTP on the right more than the left Cerebellar: Unable to perform   Labs/Imaging/Neurodiagnostic studies   CBC:  Recent Labs  Lab 07/10/2024 0950 06/23/24 0459 06/25/24 0430 06/27/24 1453 06/27/24 1500 06/27/24 1549  WBC 8.1   < > 13.5* 24.6*  --   --   NEUTROABS 5.8  --   --  21.6*  --   --   HGB 8.1*   < > 8.9* 9.5* 9.5* 6.1*  HCT 26.4*   < > 29.3* 29.9* 28.0* 18.0*  MCV 97.1   < > 101.4* 98.7  --   --   PLT 415*   < > 367 503*  --   --    < > = values in this interval not displayed.   Basic Metabolic Panel:  Lab Results  Component Value Date   NA 141 06/27/2024   K 2.1 (LL) 06/27/2024   CO2 22 06/27/2024   GLUCOSE 104 (H)  06/27/2024   BUN 29 (H) 06/27/2024   CREATININE 2.70 (H) 06/27/2024   CALCIUM  7.1 (L) 06/27/2024   GFRNONAA 24 (L) 06/27/2024   Lipid Panel:  Lab Results  Component Value Date   LDLCALC 67 04/13/2022   HgbA1c:  Lab Results  Component Value Date   HGBA1C 5.8 (H) 06/16/2024   Urine Drug Screen: No results found for: LABOPIA, COCAINSCRNUR, LABBENZ, AMPHETMU, THCU, LABBARB  Alcohol Level     Component Value Date/Time   ETH <10 04/12/2022 0931   INR  Lab Results  Component Value Date   INR 1.1 04/12/2022   APTT  Lab Results  Component Value Date   APTT 32 04/12/2022   AED levels: No results found for: PHENYTOIN, ZONISAMIDE, LAMOTRIGINE, LEVETIRACETA  CT Head without contrast(Personally reviewed): No evidence of acute intracranial abnormality. ASPECTS of 10. 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  CT angio Head and Neck with contrast(Personally reviewed): 1. No emergent large vessel occlusion. Suboptimal assessment of the intracranial branch vessels. 2. 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 3. Mild cervical carotid artery atherosclerosis without stenosis. 4. 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 5.  Aortic Atherosclerosis (ICD10-I70.0).  CT angio chest/abdomen/pelvis: 1. Aortic atherosclerosis without evidence of aneurysm or dissection. 2. Postsurgical changes with resection of the cecum and neo terminal ileum anastomosis, new from the previous exam. Moderate amount of free air in the abdomen, likely postsurgical. 3. Mildly distended loops of small bowel in the abdomen without evidence of transition point, possible ileus versus are earlier possible extraction. 4. Segmental thickening of the colonic and colonic wall thickening involving the sigmoid colon and rectum, which may be  infectious or inflammatory. 5. Bronchial wall thickening with areas of mucous plugging at the lung bases and basilar consolidation, concerning for infection. 6. Small bilateral pleural effusions, slightly increased from the prior exam. 7. Enteric tube terminates at the gastroesophageal junction and should be advanced. 8. Cholelithiasis. 9. Coronary artery calcifications.  MRI Brain(Personally reviewed): Pending  Neurodiagnostics rEEG:  Pending  ASSESSMENT   Jake Bartlett is a 83 y.o. male hx of diabetes, high cholesterol, stroke, cecal mass with recent colon resection, UTI who presented via EMS after being found unresponsive at his skilled nursing facility. EMS reported glucose in the 20s and gave 1 amp of D50 with repeat glucose 50 then glucose 300 on arrival. BVM on arrival and then intubated by EDP. Code stroke activated for left sided deficits. Hx of ICH with left sided weakness that reportedly improved with no deficits. He was discharged 9/5 after colon resection for an adenocarcinoma. Wbc 24.6, blood cultures pending. Lactic 1.2  RECOMMENDATIONS  - MRI brain w wo if able given hx of adenocarcinoma; MRI wo if unable to tolerate contrast due to kidney function - Routine EEG   Signed, Jorene Last, NP [facility]  ATTENDING ATTESTATION:  Found unresponsive at skilled nursing facility.  Brought to the ER where physician noticed decreased left-sided movements to pain but actively decompensating.  He had to be resuscitated and intubated.  History of intracranial hemorrhage with baseline left-sided deficits.  Discussed with his wife apparently the left side was improving.  Stat head CT completed after he was stabilized in the trauma bay which is negative for bleed.  CTA is negative for LVO.  Not eligible for TNK or thrombectomy.  With history of colon cancer there is a possibility he may have mets.  Elevated renal function unclear if he be able to get a brain MRI with and  without contrast.  Further workup depending on MRI results  Dr. Nichola evaluated pt independently, reviewed imaging, chart, labs. Discussed and formulated plan with the Resident/APP. Changes were made to the note where appropriate. Please see APP/resident note above for details.   rt.  FIF:Yphy. Pertinent labs, imaging results reviewed by me and considered in my decision making. Independently reviewed imaging. Medical records reviewed. Discussed the patient with another medical provider/personnel. Obtained history from someone other than the patient.      Aviana Shevlin,MD

## 2024-06-27 NOTE — Progress Notes (Addendum)
 Pharmacy Antibiotic Note  Jake Bartlett is a 83 y.o. male admitted on 06/27/2024 with sepsis.  Pharmacy has been consulted for Vancomycin  and Zosyn  dosing.  Plan: Vancomycin  1500 mg IV x 1 dose then 1250 mg IV every 48 hours.  Goal trough 15-20 mcg/mL. Zosyn  3.375g IV q8h (4 hour infusion).     Temp (24hrs), Avg:96.9 F (36.1 C), Min:96.9 F (36.1 C), Max:96.9 F (36.1 C)  Recent Labs  Lab 06/21/24 0950 06/23/24 0459 06/25/24 0430 06/27/24 1453 06/27/24 1500 06/27/24 1501  WBC 8.1 9.8 13.5* 24.6*  --   --   CREATININE  --  2.24* 2.35* 2.60* 2.70*  --   LATICACIDVEN  --   --   --   --   --  1.2    Estimated Creatinine Clearance: 19.7 mL/min (A) (by C-G formula based on SCr of 2.7 mg/dL (H)).    Allergies  Allergen Reactions   Ropinirole Anaphylaxis, Swelling and Other (See Comments)    Angioedema   Gabapentin Other (See Comments)    Drowsiness- Made him extremely sleepy - even a low dose    Antimicrobials this admission: Vancomycin  9/7 >>  Zosyn  9/7 >>   Dose adjustments this admission: None, zosyn  on borderline of dose reduction, can consider based on Scr tomorrow  Microbiology results: 9/7 BCx: pending   Thank you for allowing pharmacy to be a part of this patient's care.  Jake Bartlett 06/27/2024 5:53 PM

## 2024-06-27 NOTE — H&P (Cosign Needed Addendum)
 NAME:  Aleksey Newbern, MRN:  [id number], DOB:  Feb 10, 1941, LOS: 0 ADMISSION DATE:  06/27/2024, CONSULTATION DATE:  06/27/24 REFERRING MD:  Dr. Jerrol, CHIEF COMPLAINT:  AMS   History of Present Illness:  Pt encephalopathic, therefore HPI obtained from EMR.   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  Outpatient oncology follow up arranged.    Presented with altered mental status, reported hypoglycemic with EMS with GCS 7 treated but mental status did not improve.  Some concern for worsening LUE weakness, stroke team activated on arrival.  Intubated for airway protection.  Hypotensive post intubation, treated with 3L and started on peripheral levophed . Taken for [facility] and CTA head/ neck neg, CTA chest/ abd/ pelvis neg for aneurysm or dissection, noted to have moderate amount of free air in abd, thought post-surgical related and bibasilar mucous plugging and consolidation with small bilateral pleural effusions.  Labs noted for WBC 24.6, H/H 9.5/29, K 3.3, glucose 106, BUN/ sCr 26/ 2.6 (previously 24/ 2.35), low albumin / protein, ammonia 33, lactic 1.2.  Pending UA/ UC.  Blood cultures sent, cefepime, flagyl  and vancomycin  ordered.  PCCM called for admit.   Pertinent  Medical History   Past Medical History:  Diagnosis Date   Diabetes mellitus without complication (HCC)    High cholesterol    Stroke (HCC)    Significant Hospital Events: Including procedures, antibiotic start and stop dates in addition to other pertinent events     Interim History / Subjective:   Objective    Blood pressure 110/66, pulse 65, temperature (!) 96.9 F (36.1 C), resp. rate 16, SpO2 99%.    Vent Mode: PRVC FiO2 (%):  [60 %] 60 % Set Rate:  [15 bmp] 15 bmp Vt  Set:  [540 mL] 540 mL PEEP:  [5 cmH20] 5 cmH20  No intake or output data in the 24 hours ending 06/27/24 1642 There were no vitals filed for this visit.  Examination: Fentanyl  50 mcg General:  AoC ill appearing elderly male unresponsive on MV HEENT: MM pink/moist, ETT/ OGT, pupils 3/r, EEG leads in place Neuro: unresponsive CV: rr, SR, distant HS, no obvious murmur, +1 pulses PULM:  MV supported, ronchi L> R, no wheeze  GI: soft, hypoBS, small midline incision/ lap incisions approximated/ no erythema or drainage Extremities: cool /dry, no tibial edema  Skin: pale, right heel wound with CDI, base noted without purulent drainage or erthyma  CTA chest/ abd/ pelvis>  1. Aortic atherosclerosis without evidence of aneurysm or dissection. 2. Postsurgical changes with resection of the cecum and neo terminal ileum anastomosis, new from the previous exam. Moderate amount of free air in the abdomen, likely postsurgical. 3. Mildly distended loops of small bowel in the abdomen without evidence of transition point, possible ileus versus are earlier possible extraction. 4. Segmental thickening of the colonic and colonic wall thickening involving the sigmoid colon and rectum, which may be infectious or inflammatory. 5. Bronchial wall thickening with areas of mucous plugging at the lung bases and basilar consolidation, concerning for infection. 6. Small bilateral pleural effusions, slightly increased from the prior exam. 7. Enteric tube terminates at the gastroesophageal junction and should be advanced. 8. Cholelithiasis. 9. Coronary artery calcifications   Resolved problem list   Assessment and Plan   Shock- presumed  septic/ hypovolemic P:  - admit to ICU - cont to wean peripheral NE for MAP goal > 65.  Good MAP will hold on CVL for now - free air noted on CT imaging, felt post surgical.  Abd soft, normal lactic in ER - consider adding stress dose steroids if increasing pressor  amounts - hold further fluids, bedside pocus with dilated IVC.  Consider albumin  if needed  - recheck H/H, no obvious source of bleeding - check PCT, send trach asp, UA/ UC when able - cont with zosyn / vanc - follow cultures, trend WBC/ fever curve   Acute respiratory insufficiency related to encephalopathy  Presumed BLL aspiration pneumonia Bilateral pleural effusions, small P:  - ABG noted 7.362/ 31/ 439/ 17 - cont full LTVV support, 4-8cc/kg IBW with goal Pplat <30 and DP<15  - VAP prevention protocol/ PPI - PAD protocol for sedation> prn fentanyl  with scheduled bowel regimen - intermittent CXR/ ABG - wean FiO2 as able for SpO2 >92%  - daily SAT & SBT when appropriate  -  prn bronchodilators  - check MRSA PCR, trach asp, urine legionella/ strep   Acute encephalopathy- presumed metabolic  Hx of right thalamic ICH, ? Residual left deficits (per EDP, denied by wife) - CTH/ CTA head/ neck neg - appreciate Neurology input and further recommendations - pending EEG - ammonia neg, check TSH - minimize sedation  - serial neuro exams - neuro protective measures   Mild AKI on CKD stage IV Hypokalemia  Hypocalcemia  Hx penile implant P:  - s/p 3L LR total in ER - KCL 40 meq x 1, calcium  gluconate 1gm, check mag - pending UA - trend renal indices  - strict I/Os, daily wts - avoid nephrotoxins, renal dose meds, hemodynamic support as above  Chronic normocytic anemia/ IDA - recheck H/H and type/ screen now, istat Hgb drop but no obvious source, could be dilutional - hold heparin  SQ VTE ppx for now - CBC daily, no obvious source of bleeding - transfuse per protocol - no FE with septic shock  DMT2 with reported hypoglycemia - CBG q4, if stable, consider adding prn SSI, goal 140-180  HTN - hold coreg with hypotension  Protein calorie Malnutrition - thiamine  - RD consult for EN  R heel wound - previous workup 8/27 not concerning for osteomyelitis  - WOC  Colon  adenocarcinoma  - recent cecal mass s/p R hemicolectomy 9/2  - output oncology f/u as planned with Dr. Cloretta   Chronic debility/ deconditioning  - PT/ OT when able    Labs   CBC: Recent Labs  Lab 06/21/24 0950 06/23/24 0459 06/25/24 0430 06/27/24 1453 06/27/24 1500 06/27/24 1549  WBC 8.1 9.8 13.5* 24.6*  --   --   NEUTROABS 5.8  --   --  21.6*  --   --   HGB 8.1* 8.7* 8.9* 9.5* 9.5* 6.1*  HCT 26.4* 28.6* 29.3* 29.9* 28.0* 18.0*  MCV 97.1 98.6 101.4* 98.7  --   --   PLT 415* 332 367 503*  --   --     Basic Metabolic Panel: Recent Labs  Lab 06/23/24 0459 06/25/24 0430 06/27/24 1453 06/27/24 1500 06/27/24 1549  NA 140 142 134* 139 141  K 3.9 4.4 3.3* 3.4* 2.1*  CL 107 109 105 105  --   CO2 21* 22 22  --   --   GLUCOSE 212* 123* 106* 104*  --   BUN 18 24* 26* 29*  --   CREATININE  2.24* 2.35* 2.60* 2.70*  --   CALCIUM  7.5* 7.9* 7.1*  --   --    GFR: Estimated Creatinine Clearance: 19.7 mL/min (A) (by C-G formula based on SCr of 2.7 mg/dL (H)). Recent Labs  Lab 06/21/24 0950 06/23/24 0459 06/25/24 0430 06/27/24 1453 06/27/24 1501  WBC 8.1 9.8 13.5* 24.6*  --   LATICACIDVEN  --   --   --   --  1.2    Liver Function Tests: Recent Labs  Lab 06/27/24 1453  AST 15  ALT <5  ALKPHOS 115  BILITOT 0.4  PROT 4.6*  ALBUMIN  <1.5*   No results for input(s): LIPASE, AMYLASE in the last 168 hours. Recent Labs  Lab 06/27/24 1457  AMMONIA 33    ABG    Component Value Date/Time   PHART 7.362 06/27/2024 1549   PCO2ART 31.2 (L) 06/27/2024 1549   PO2ART 439 (H) 06/27/2024 1549   HCO3 17.7 (L) 06/27/2024 1549   TCO2 19 (L) 06/27/2024 1549   ACIDBASEDEF 7.0 (H) 06/27/2024 1549   O2SAT 100 06/27/2024 1549     Coagulation Profile: No results for input(s): INR, PROTIME in the last 168 hours.  Cardiac Enzymes: No results for input(s): CKTOTAL, CKMB, CKMBINDEX, TROPONINI in the last 168 hours.  HbA1C: Hgb A1c MFr Bld  Date/Time Value Ref  Range Status  06/16/2024 07:26 PM 5.8 (H) 4.8 - 5.6 % Final    Comment:    (NOTE)         Prediabetes: 5.7 - 6.4         Diabetes: >6.4         Glycemic control for adults with diabetes: <7.0   04/12/2022 09:28 AM 7.0 (H) 4.8 - 5.6 % Final    Comment:    (NOTE) Pre diabetes:          5.7%-6.4%  Diabetes:              >6.4%  Glycemic control for   <7.0% adults with diabetes     CBG: Recent Labs  Lab 06/24/24 2100 06/25/24 0731 06/25/24 1203 06/27/24 1453 06/27/24 1536  GLUCAP 134* 141* 287* 114* 80    Review of Systems:   Unable   Past Medical History:  He,  has a past medical history of Diabetes mellitus without complication (HCC), High cholesterol, and Stroke (HCC).   Surgical History:   Past Surgical History:  Procedure Laterality Date   BACK SURGERY     BONE BIOPSY  06/20/2024   Procedure: BIOPSY, GI;  Surgeon: Saintclair Jasper, MD;  Location: [facility];  Service: Gastroenterology;;   COLONOSCOPY N/A 06/20/2024   Procedure: COLONOSCOPY;  Surgeon: Saintclair Jasper, MD;  Location: [facility];  Service: Gastroenterology;  Laterality: N/A;   HIP SURGERY     LAPAROSCOPIC PARTIAL COLECTOMY N/A 06/22/2024   Procedure: LAPAROSCOPIC RIGHT COLECTOMY;  Surgeon: Teresa Lonni HERO, MD;  Location: [facility] ORS;  Service: General;  Laterality: N/A;   PENILE PROSTHESIS IMPLANT  08/14/2006   AMS Penile Prosthesis; Performed by MYRTIS Stalling MD     Social History:   reports that he has never smoked. He has never been exposed to tobacco smoke. He has never used smokeless tobacco. He reports that he does not drink alcohol and does not use drugs.   Family History:  His family history is not on file.   Allergies Allergies  Allergen Reactions   Ropinirole Anaphylaxis, Swelling and Other (See Comments)    Angioedema   Gabapentin Other (See Comments)  Drowsiness- Made him extremely sleepy - even a low dose     Home Medications  Prior to Admission medications   Medication Sig Start  Date End Date Taking? Authorizing Provider  acetaminophen  (TYLENOL ) 500 MG tablet Take 500-1,000 mg by mouth every 8 (eight) hours as needed for mild pain (pain score 1-3) (or headaches).    [provider]  B Complex Vitamins (VITAMIN B COMPLEX) TABS Take 1 tablet by mouth daily.    [provider]  carbidopa -levodopa  (SINEMET  IR) 25-250 MG tablet Take 1 tablet by mouth in the morning, at noon, and at bedtime. 01/09/22   [provider]  carvedilol (COREG) 3.125 MG tablet Take 3.125 mg by mouth in the morning.    [provider]  Cholecalciferol  (VITAMIN D3 PO) Take 1 capsule by mouth daily.    [provider]  escitalopram  (LEXAPRO ) 10 MG tablet Take 10 mg by mouth daily.    [provider]  feeding supplement (ENSURE PLUS HIGH PROTEIN) LIQD Take 237 mLs by mouth 2 (two) times daily between meals. 06/25/24   Jillian Buttery, MD  folic acid  (FOLVITE ) 1 MG tablet Take 1 mg by mouth daily. 11/23/21   [provider]  Multiple Vitamin (MULTIVITAMIN) tablet Take 1 tablet by mouth daily with breakfast.    [provider]  NOVOLIN 70/30 KWIKPEN (70-30) 100 UNIT/ML KwikPen Inject 10-20 Units into the skin See admin instructions. Inject 20 units into the skin in the morning and 10 units at bedtime    [provider]  Nystatin (GERHARDT'S BUTT CREAM) CREA Apply 1 Application topically 3 (three) times daily. 06/25/24   Jillian Buttery, MD  oxyCODONE  (OXY IR/ROXICODONE ) 5 MG immediate release tablet Take 1 tablet (5 mg total) by mouth every 4 (four) hours as needed for moderate pain (pain score 4-6). 06/25/24   Jillian Buttery, MD  pramipexole  (MIRAPEX ) 0.25 MG tablet Take 0.25 mg by mouth at bedtime.    [provider]  rosuvastatin  (CRESTOR ) 10 MG tablet Take 10 mg by mouth at bedtime. 01/16/22   [provider]  sodium bicarbonate  650 MG tablet Take 1 tablet (650 mg total) by mouth 3 (three) times daily. 06/25/24    Jillian Buttery, MD  tamsulosin  (FLOMAX ) 0.4 MG CAPS capsule Take 1 capsule (0.4 mg total) by mouth daily. Patient not taking: Reported on 06/16/2024 02/24/22   Towana Ozell BROCKS, MD     Critical care time: 60 mins       Lyle Pesa, NP [facility] 06/27/2024, 4:42 PM  See Amion for pager If no response to pager , please call [phone] until 7pm After 7:00 pm call Elink  [phone]

## 2024-06-28 ENCOUNTER — Inpatient Hospital Stay (HOSPITAL_COMMUNITY)

## 2024-06-28 ENCOUNTER — Encounter (HOSPITAL_COMMUNITY): Payer: Self-pay

## 2024-06-28 DIAGNOSIS — R4182 Altered mental status, unspecified: Secondary | ICD-10-CM

## 2024-06-28 DIAGNOSIS — R569 Unspecified convulsions: Secondary | ICD-10-CM | POA: Diagnosis not present

## 2024-06-28 DIAGNOSIS — J189 Pneumonia, unspecified organism: Secondary | ICD-10-CM | POA: Diagnosis not present

## 2024-06-28 DIAGNOSIS — R531 Weakness: Secondary | ICD-10-CM

## 2024-06-28 DIAGNOSIS — J9601 Acute respiratory failure with hypoxia: Secondary | ICD-10-CM

## 2024-06-28 DIAGNOSIS — D631 Anemia in chronic kidney disease: Secondary | ICD-10-CM

## 2024-06-28 DIAGNOSIS — E11649 Type 2 diabetes mellitus with hypoglycemia without coma: Secondary | ICD-10-CM

## 2024-06-28 DIAGNOSIS — R6521 Severe sepsis with septic shock: Secondary | ICD-10-CM | POA: Diagnosis not present

## 2024-06-28 DIAGNOSIS — A419 Sepsis, unspecified organism: Secondary | ICD-10-CM | POA: Diagnosis not present

## 2024-06-28 DIAGNOSIS — J690 Pneumonitis due to inhalation of food and vomit: Secondary | ICD-10-CM

## 2024-06-28 LAB — FOLATE: Folate: 16.9 ng/mL (ref >5.9)

## 2024-06-28 LAB — CBC
HCT: 33.3 % — ABNORMAL LOW (ref 39.0–52.0)
Hemoglobin: 10.2 g/dL — ABNORMAL LOW (ref 13.0–17.0)
MCH: 30.7 pg (ref 26.0–34.0)
MCHC: 30.6 g/dL (ref 30.0–36.0)
MCV: 100.3 fL — ABNORMAL HIGH (ref 80.0–100.0)
Platelets: 459 K/uL — ABNORMAL HIGH (ref 150–400)
RBC: 3.32 MIL/uL — ABNORMAL LOW (ref 4.22–5.81)
RDW: 17.6 % — ABNORMAL HIGH (ref 11.5–15.5)
WBC: 21.2 K/uL — ABNORMAL HIGH (ref 4.0–10.5)
nRBC: 0 % (ref 0.0–0.2)

## 2024-06-28 LAB — GLUCOSE, CAPILLARY
Glucose-Capillary: 102 mg/dL — ABNORMAL HIGH (ref 70–99)
Glucose-Capillary: 135 mg/dL — ABNORMAL HIGH (ref 70–99)
Glucose-Capillary: 185 mg/dL — ABNORMAL HIGH (ref 70–99)
Glucose-Capillary: 192 mg/dL — ABNORMAL HIGH (ref 70–99)
Glucose-Capillary: 232 mg/dL — ABNORMAL HIGH (ref 70–99)
Glucose-Capillary: 92 mg/dL (ref 70–99)
Glucose-Capillary: 99 mg/dL (ref 70–99)

## 2024-06-28 LAB — POCT I-STAT 7, (LYTES, BLD GAS, ICA,H+H)
Acid-Base Excess: 11 mmol/L — ABNORMAL HIGH (ref 0.0–2.0)
Bicarbonate: 33.5 mmol/L — ABNORMAL HIGH (ref 20.0–28.0)
Calcium, Ion: 0.99 mmol/L — ABNORMAL LOW (ref 1.15–1.40)
HCT: 27 % — ABNORMAL LOW (ref 39.0–52.0)
Hemoglobin: 9.2 g/dL — ABNORMAL LOW (ref 13.0–17.0)
O2 Saturation: 99 %
Patient temperature: 37.2
Potassium: 4.5 mmol/L (ref 3.5–5.1)
Sodium: 146 mmol/L — ABNORMAL HIGH (ref 135–145)
TCO2: 35 mmol/L — ABNORMAL HIGH (ref 22–32)
pCO2 arterial: 35.3 mmHg (ref 32–48)
pH, Arterial: 7.587 — ABNORMAL HIGH (ref 7.35–7.45)
pO2, Arterial: 112 mmHg — ABNORMAL HIGH (ref 83–108)

## 2024-06-28 LAB — COMPREHENSIVE METABOLIC PANEL WITH GFR
ALT: <5 U/L (ref 0–44)
AST: 17 U/L (ref 15–41)
Albumin: <1.5 g/dL — ABNORMAL LOW (ref 3.5–5.0)
Alkaline Phosphatase: 112 U/L (ref 38–126)
Anion gap: 14 (ref 5–15)
BUN: 25 mg/dL — ABNORMAL HIGH (ref 8–23)
CO2: 14 mmol/L — ABNORMAL LOW (ref 22–32)
Calcium: 7.2 mg/dL — ABNORMAL LOW (ref 8.9–10.3)
Chloride: 109 mmol/L (ref 98–111)
Creatinine, Ser: 2.31 mg/dL — ABNORMAL HIGH (ref 0.61–1.24)
GFR, Estimated: 27 mL/min — ABNORMAL LOW (ref >60)
Glucose, Bld: 90 mg/dL (ref 70–99)
Potassium: 3.4 mmol/L — ABNORMAL LOW (ref 3.5–5.1)
Sodium: 137 mmol/L (ref 135–145)
Total Bilirubin: 0.6 mg/dL (ref 0.0–1.2)
Total Protein: 4.4 g/dL — ABNORMAL LOW (ref 6.5–8.1)

## 2024-06-28 LAB — VITAMIN B12: Vitamin B-12: 1049 pg/mL — ABNORMAL HIGH (ref 180–914)

## 2024-06-28 MED ORDER — SODIUM BICARBONATE 650 MG PO TABS
650.0000 mg | ORAL_TABLET | Freq: Three times a day (TID) | ORAL | Status: DC
  Administered 2024-06-28 – 2024-06-29 (×3): 650 mg
  Filled 2024-06-28 (×5): qty 1

## 2024-06-28 MED ORDER — PRAMIPEXOLE DIHYDROCHLORIDE 0.25 MG PO TABS
0.2500 mg | ORAL_TABLET | Freq: Every day | ORAL | Status: DC
  Administered 2024-06-28 – 2024-07-04 (×6): 0.25 mg
  Filled 2024-06-28 (×8): qty 1

## 2024-06-28 MED ORDER — VITAL HP 1.0 CAL PO LIQD: 1000.0000 mL | ORAL | Status: DC

## 2024-06-28 MED ORDER — VITAL 1.5 CAL PO LIQD
1000.0000 mL | ORAL | Status: DC
  Administered 2024-06-28: 1000 mL
  Filled 2024-06-28: qty 1000

## 2024-06-28 MED ORDER — MEDIHONEY WOUND/BURN DRESSING EX PSTE
1.0000 | PASTE | Freq: Every day | CUTANEOUS | Status: DC
  Administered 2024-06-28 – 2024-07-04 (×7): 1 via TOPICAL
  Filled 2024-06-28: qty 44

## 2024-06-28 MED ORDER — SODIUM BICARBONATE 8.4 % IV SOLN
100.0000 meq | Freq: Once | INTRAVENOUS | Status: AC
  Administered 2024-06-28: 100 meq via INTRAVENOUS
  Filled 2024-06-28: qty 100

## 2024-06-28 MED ORDER — VASOPRESSIN 20 UNITS/100 ML INFUSION FOR SHOCK
0.0000 [IU]/min | INTRAVENOUS | Status: DC
  Filled 2024-06-28: qty 100

## 2024-06-28 MED ORDER — HYDROCORTISONE SOD SUC (PF) 100 MG IJ SOLR
100.0000 mg | Freq: Two times a day (BID) | INTRAMUSCULAR | Status: DC
  Administered 2024-06-28 – 2024-06-29 (×4): 100 mg via INTRAVENOUS
  Filled 2024-06-28 (×4): qty 2

## 2024-06-28 MED ORDER — INSULIN ASPART 100 UNIT/ML IJ SOLN
2.0000 [IU] | INTRAMUSCULAR | Status: DC
  Administered 2024-06-29 (×3): 6 [IU] via SUBCUTANEOUS

## 2024-06-28 MED ORDER — ADULT MULTIVITAMIN W/MINERALS CH
1.0000 | ORAL_TABLET | Freq: Every day | ORAL | Status: DC
  Administered 2024-06-28 – 2024-06-29 (×2): 1
  Filled 2024-06-28 (×3): qty 1

## 2024-06-28 MED ORDER — POTASSIUM CHLORIDE 20 MEQ PO PACK
20.0000 meq | PACK | Freq: Once | ORAL | Status: AC
  Administered 2024-06-28: 20 meq
  Filled 2024-06-28: qty 1

## 2024-06-28 MED ORDER — HEPARIN SODIUM (PORCINE) 5000 UNIT/ML IJ SOLN
5000.0000 [IU] | Freq: Three times a day (TID) | INTRAMUSCULAR | Status: DC
  Administered 2024-06-28 – 2024-07-05 (×22): 5000 [IU] via SUBCUTANEOUS
  Filled 2024-06-28 (×22): qty 1

## 2024-06-28 MED ORDER — CARBIDOPA-LEVODOPA 25-250 MG PO TABS
1.0000 | ORAL_TABLET | Freq: Three times a day (TID) | ORAL | Status: DC
  Administered 2024-06-28 – 2024-06-29 (×3): 1
  Filled 2024-06-28 (×7): qty 1

## 2024-06-28 MED ORDER — PROSOURCE TF20 ENFIT COMPATIBL EN LIQD
60.0000 mL | Freq: Every day | ENTERAL | Status: DC
  Administered 2024-06-28 – 2024-07-05 (×7): 60 mL
  Filled 2024-06-28 (×7): qty 60

## 2024-06-28 MED ORDER — POTASSIUM CHLORIDE 20 MEQ PO PACK
40.0000 meq | PACK | Freq: Once | ORAL | Status: AC
  Administered 2024-06-28: 40 meq
  Filled 2024-06-28: qty 2

## 2024-06-28 MED ORDER — ESCITALOPRAM OXALATE 10 MG PO TABS
10.0000 mg | ORAL_TABLET | Freq: Every day | ORAL | Status: DC
  Administered 2024-06-28 – 2024-07-05 (×7): 10 mg
  Filled 2024-06-28 (×7): qty 1

## 2024-06-28 MED ORDER — LACTATED RINGERS IV SOLN: INTRAVENOUS | Status: DC

## 2024-06-28 NOTE — Procedures (Signed)
 Patient Name: Jake Bartlett  MRN: [id number]  Epilepsy Attending: Arlin MALVA Krebs  Referring Physician/Provider: Remi Pippin, NP  Date: 06/27/2024 Duration: 24.05 mins  Patient history: 83yo M with ams. EEG to evaluate for seizure  Level of alertness: comatose/ lethargic   AEDs during EEG study: None  Technical aspects: This EEG study was done with scalp electrodes positioned according to the 10-20 International system of electrode placement. Electrical activity was reviewed with band pass filter of 1-70Hz , sensitivity of 7 uV/mm, display speed of 42mm/sec with a 60Hz  notched filter applied as appropriate. EEG data were recorded continuously and digitally stored.  Video monitoring was available and reviewed as appropriate.  Description: EEG showed continuous generalized 3 to 6 Hz theta-delta slowing, at times with triphasic morphology. Hyperventilation and photic stimulation were not performed.     ABNORMALITY - Continuous slow, generalized  IMPRESSION: This study is suggestive of severe diffuse encephalopathy, likely secondary to toxic-metabolic causes. No seizures or epileptiform discharges were seen throughout the recording.   Jake Bartlett

## 2024-06-28 NOTE — Progress Notes (Signed)
 eLink Physician-Brief Progress Note Patient Name: Jake Bartlett DOB: 1941/07/20 MRN: [id number]   Date of Service  06/28/2024  HPI/Events of Note  Glucose uptrending, now 232 following initiation of tube feeds, steroids.   eICU Interventions  Started on moderate dose SSI q4h.  Will continue to follow glucose trends, adjust insulin  as warranted.         Rolondo Pierre M DELA CRUZ 06/28/2024, 11:51 PM

## 2024-06-28 NOTE — Progress Notes (Addendum)
 Initial Nutrition Assessment  DOCUMENTATION CODES:  Severe malnutrition in context of acute illness/injury - while patient meets criteria for malnutrition in the context of acute illness, it is likely that a number of his comorbidities that are chronic in nature are actively contributing   INTERVENTION:  Initiate tube feeding via OGT: Vital 1.5 at 50 ml/h (1200 ml per day) Initiate at 20ml/hr and increase by 10ml/hr q8h until goal rate achieved Prosource TF20 60 ml daily  Provides 1880 kcal, 101 gm protein, 917 ml free water  daily   Monitor magnesium , potassium, and phosphorus daily for at least 3 days, MD to replete as needed  Add Thiamine  100 mg daily for 5 days  Add MVI w/ minerals  If within GOC, consider replacing OGT w/ Cortrak prior to extubation and continued enteral nutrition support expected  Collect new weight to assess trend  NUTRITION DIAGNOSIS:  Severe Malnutrition related to acute illness as evidenced by percent weight loss, moderate muscle depletion.  GOAL:  Patient will meet greater than or equal to 90% of their needs  MONITOR:  TF tolerance, Diet advancement, I & O's, Labs  REASON FOR ASSESSMENT:  Consult Assessment of nutrition requirement/status, Enteral/tube feeding initiation and management  ASSESSMENT:   Pt with PMH significant for: Parkinson's disease, HTN, rith thalamic ICH w/ left-sided weakness,T2DM and stroke. Of note, recent admission for cecal mass which was found to be adenocarcinoma. S/p hemicolectomy on 9/02. Presented with AMS and hypoglycemia. Code stroke activated and patient subsequently intubated. Admitted for septic shock 2/2 aspiration PNA.  Previous Admissions 6/06 - R hip repair 8/27 - 9/05: admitted to [facility] found to have adenocarcinoma of colon 9/02 - hemicolectomy Current Admission 9/7 admitted; intubated 9/8 central line placed, TF initiated  Patient with admission in June for hip repair then again last month and found to  have adenocarcinoma of the colon. GOC discussions ongoing. Wife does not want any invasive procedures.   Patient is currently intubated on ventilator support MV: 11 L/min Temp (24hrs), Avg:97.8 F (36.6 C), Min:95.2 F (35.1 C), Max:100.2 F (37.9 C) MAPs (cuff): >65 since 0800 Diastolics: >50 since 0800  No family at bedside to obtain nutrition-related history. Per chart review, family reports significant decline over last few months and poor PO intake. Was hospitalized in June for hip surgery and then again recently with hemicolectomy. Spoke with RN who reports family have been very hesitant to provide consent for patient to undergo any type of invasive procedure. Wife originally declining central line, however did finally consent.   Discussed tube feed regimen with attending who is amicable to initiating today and slowly titrating up. Did discuss that OGT can be replaced with Cotrak as continued nutrition support likely indicated d/t reports of suspected aspiration prior to admission. Will monitor.   Admit Weight: 78.9 kg - collected 9/02 when he presented for hemicolectomy + mild, pitting edema to BLEs  Unable to obtain patient's UBW. No new weight this admission. Will order. Per chart review, most recent weight collected was one week ago when he presented for hemicolectomy. He has lost 12.4% of his body weight in the last four months. This is considered clinically significant for the time frame.   Drains/Lines: OGT (Gastric) placed 9/07 Foley catheter UOP: 525 ml x12 hours  Meds: pantoprazole , KCl, thiamine  Drips: Levo 10 - down trending Vaso IV ABX  NUTRITION - FOCUSED PHYSICAL EXAM: Patient does have some edema to BLEs, which could mask additional or more prominent muscle and fat depletions. As  evidenced by his weight trend and nutrition-focused physical exam, he does meet criteria for malnutrition in the context of acute illness. While he best fits this criteria, it is also  likely that underlying comorbidities that are chronic in nature are also active contributors.   Flowsheet Row Most Recent Value  Orbital Region Mild depletion  Upper Arm Region Mild depletion  Thoracic and Lumbar Region Mild depletion  Buccal Region Unable to assess  Temple Region Moderate depletion  Clavicle Bone Region Moderate depletion  Clavicle and Acromion Bone Region Moderate depletion  Scapular Bone Region Severe depletion  Dorsal Hand Unable to assess  Patellar Region Mild depletion  Anterior Thigh Region Mild depletion  Posterior Calf Region Unable to assess  Edema (RD Assessment) Mild  Hair Reviewed  Eyes Unable to assess  Mouth Reviewed  Skin Reviewed  Nails Reviewed    Diet Order:   Diet Order             Diet NPO time specified  Diet effective now             EDUCATION NEEDS:   Not appropriate for education at this time  Skin:  Skin Assessment: Skin Integrity Issues: Skin Integrity Issues:: Unstageable, DTI, Stage III DTI: L heel, medial sacrum Stage III: R ankle; lower anterior leg Unstageable: R heel  Per WOC note 9/08 Wound type:  1.  R heel Unstageable Pressure Injury 80% black eschar 20% pink  2.  L heel Deep Tissue Pressure Injury purple maroon discoloration  3.  Full thickness R posterior leg pink  4. Partial thickness mid back pink  5.  Deep Tissue Pressure Injury sacrum purple maroon discoloration  6.  Partial thickness skin loss B shins pink moist, some scabbed 7. Stage 3 Pressure Injury R ankle/lower anterior leg 50% pink 50% yellow (from old traction)  8.  Midline abdomen healing surgical incision  Pressure Injury POA: Yes B heels and sacrum/ankle  Measurement:see nursing flowsheet  Wound bed: as above  Drainage (amount, consistency, odor) see nursing flowsheet  Periwound: erythema to buttocks   Last BM:  PTA  Height:  Ht Readings from Last 1 Encounters:  06/22/24 5' 8 (1.727 m)   Weight:  Wt Readings from Last 1 Encounters:   06/22/24 78.9 kg   Ideal Body Weight:  70 kg  BMI:  There is no height or weight on file to calculate BMI.  Estimated Nutritional Needs:   Kcal:  1700-1900 kcals  Protein:  90-105g  Fluid:  1.7-1.9L/day  Blair Deaner MS, RD, LDN Registered Dietitian Clinical Nutrition RD Inpatient Contact Info in Amion

## 2024-06-28 NOTE — Progress Notes (Addendum)
   06/28/24 0400  Vitals  BP (!) 80/55  MAP (mmHg) (!) 64   Pt BP remain of the softer side and not meeting parameters (SBP >90, MAP >65) while maxed out on levophed . Fentanyl  at rate of 50mcg/hr for pt comfort. This RN had conversation with wife at bedside. Wife is aware and would like to keep patient comfortable at this time. This RN educated wife that if BP continues to drop, then sedation will have to be stopped temporarily to maintain adequate perfusion. Wife verbalizes understanding. Message sent to Ezzie FORBES Higashi RN.  8379: fentanyl  cut in half 3mcg/hr. Wife aware.  9479: levophed  parameters changed per JAYSON Sharps, NP.

## 2024-06-28 NOTE — Progress Notes (Signed)
 NAME:  Jake Bartlett, MRN:  [id number], DOB:  September 23, 1941, LOS: 1 ADMISSION DATE:  06/27/2024, CONSULTATION DATE:  06/27/24 REFERRING MD:  Dr. Jerrol, CHIEF COMPLAINT:  AMS   History of Present Illness:  Pt encephalopathic, therefore HPI obtained from EMR.   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  Outpatient oncology follow up arranged.    Presented with altered mental status, reported hypoglycemic with EMS with GCS 7 treated but mental status did not improve.  Some concern for worsening LUE weakness, stroke team activated on arrival.  Intubated for airway protection.  Hypotensive post intubation, treated with 3L and started on peripheral levophed . Taken for [facility] and CTA head/ neck neg, CTA chest/ abd/ pelvis neg for aneurysm or dissection, noted to have moderate amount of free air in abd, thought post-surgical related and bibasilar mucous plugging and consolidation with small bilateral pleural effusions.  Labs noted for WBC 24.6, H/H 9.5/29, K 3.3, glucose 106, BUN/ sCr 26/ 2.6 (previously 24/ 2.35), low albumin / protein, ammonia 33, lactic 1.2.  Pending UA/ UC.  Blood cultures sent, cefepime, flagyl  and vancomycin  ordered.  PCCM called for admit.   Pertinent  Medical History   Past Medical History:  Diagnosis Date   Diabetes mellitus without complication (HCC)    High cholesterol    Stroke (HCC)    Significant Hospital Events: Including procedures, antibiotic start and stop dates in addition to other pertinent events   9/7 admitted with septic shock likely due to aspiration pneumonia on vasopressor support  Interim History / Subjective:  Patient continued to require vasopressor support, currently on 14 mics of Levophed  Yesterday  patient's wife refused central line, just spoke with her, we will proceed with central line placement Remained afebrile, white count started coming down  Objective    Blood pressure 94/66, pulse (!) 103, temperature 98.6 F (37 C), resp. rate 16, SpO2 100%.    Vent Mode: PRVC FiO2 (%):  [40 %-60 %] 40 % Set Rate:  [15 bmp-16 bmp] 16 bmp Vt Set:  [540 mL] 540 mL PEEP:  [5 cmH20] 5 cmH20 Plateau Pressure:  [22 cmH20] 22 cmH20   Intake/Output Summary (Last 24 hours) at 06/28/2024 0734 Last data filed at 06/28/2024 0600 Gross per 24 hour  Intake 4510.54 ml  Output 525 ml  Net 3985.54 ml   There were no vitals filed for this visit.  Examination: General: Crtitically ill-appearing elderly male, orally intubated HEENT: Friendly/AT, eyes anicteric.  ETT and OGT in place Neuro: Opens eyes with vocal stimuli, following simple commands, generalized weak Chest: Bilateral basal crackles, no wheezes or rhonchi Heart: Regular rate and rhythm, no murmurs or gallops Abdomen: Soft, nondistended, bowel sounds present   Labs and images reviewed  Patient Lines/Drains/Airways Status     Active Line/Drains/Airways     Name Placement date Placement time Site Days   Peripheral IV 06/27/24 18 G Right Antecubital 06/27/24  1600  Antecubital  1   Peripheral IV 06/27/24 20 G Left Antecubital 06/27/24  1500  Antecubital  1   Urethral Catheter jamie RN Double-lumen 14 Fr. 06/27/24  1652  Double-lumen  1   Airway 7.5 mm 06/27/24  1505  -- 1   Wound 06/27/24 1931 Pressure Injury Sacrum Medial Deep Tissue Pressure Injury -  Purple or maroon localized area of discolored intact skin or blood-filled blister due to damage of underlying soft tissue from pressure and/or shear. 06/27/24  1931  Sacrum  1   Wound 06/16/24 1931 Pressure Injury Ischial tuberosity Right Unstageable - Full thickness tissue loss in which the base of the injury is covered by slough (yellow, tan, gray, green or brown) and/or eschar (tan, brown or  black) in the wound bed. 06/16/24  1931  Ischial tuberosity  12   Wound 06/16/24 1931 Pressure Injury Heel Right Unstageable - Full thickness tissue loss in which the base of the injury is covered by slough (yellow, tan, gray, green or brown) and/or eschar (tan, brown or black) in the wound bed. 06/16/24  1931  Heel  12   Wound 06/16/24 1931 Pressure Injury Ankle Distal;Right Stage 3 -  Full thickness tissue loss. Subcutaneous fat may be visible but bone, tendon or muscle are NOT exposed. 06/16/24  1931  Ankle  12   Wound 06/16/24 1931 Other (Comment) Pretibial Right 06/16/24  1931  Pretibial  12   Wound 06/22/24 1644 Surgical Closed Surgical Incision Abdomen Other (Comment) 06/22/24  1644  Abdomen  6   Wound 06/22/24 1648 Surgical Laparoscopic Abdomen 1: Left;Lower 2: Left;Upper 3: Right 06/22/24  1648  Abdomen  6   Wound 06/27/24 Pressure Injury Heel Left Deep Tissue Pressure Injury - Purple or maroon localized area of discolored intact skin or blood-filled blister due to damage of underlying soft tissue from pressure and/or shear. 06/27/24  --  Heel  1         Resolved problem list   Assessment and Plan  Septic shock due to bilateral multifocal pneumonia, likely aspiration Patient continues to require vasopressor support, titrate with MAP of 65 Will proceed with central line placement Continue IV fluid Continue broad-spectrum antibiotic, with Zosyn  Will discontinue vancomycin  considering MRSA PCR is negative Follow-up culture and adjust antibiotic accordingly  Acute respiratory failure with hypoxia due to bilateral multifocal pneumonia Continue lung protective ventilation VAP prevention bundle in place Stop sedation He is tolerating spontaneous breathing trials but generalized weak, high risk of recurrent aspiration MRSA PCR is negative  Acute septic encephalopathy Patient mental status is improving Avoid deep sedation  Prior right thalamic ICH, with residual left deficit Repeat  CT head is negative for acute findings Continue supportive care  Mild AKI on CKD stage IV Hypokalemia  Hypocalcemia  Serum creatinine is trending down Continue IV fluid Avoid nephrotoxic agent Continue aggressive electrolyte replacement  Anemia of chronic disease Monitor H&H and transfuse if less than 7 H&H is stable around 10 currently  Well-controlled diabetes type 2 with recurrent hypoglycemia Patient hemoglobin A1c is 5.8 Required D50 boluses Now fingerstick is stable Continue sliding scale insulin  with CBG goal 140-180  HTN Hold antihypertensive meds considering shock  Multiple pressure injuries, POA 1.  R heel Unstageable Pressure Injury 80% black eschar 20% pink  2.  L heel Deep Tissue Pressure Injury purple maroon discoloration  3.  Full thickness R posterior leg pink  4. Partial thickness mid back pink  5.  Deep Tissue Pressure Injury sacrum purple maroon discoloration  6.  Partial thickness skin loss B shins pink moist, some scabbed 7. Stage 3 Pressure Injury R ankle/lower anterior leg 50% pink 50% yellow (from old traction)  8.  Midline abdomen healing surgical incision  Wound care is following  Colon adenocarcinoma  Recent cecal mass s/p R hemicolectomy 9/2  Output oncology f/u as planned  with Dr. Cloretta    Labs   CBC: Recent Labs  Lab 06/21/24 0950 06/23/24 0459 06/25/24 0430 06/27/24 1453 06/27/24 1500 06/27/24 1549 06/27/24 1901 06/28/24 0247  WBC 8.1 9.8 13.5* 24.6*  --   --   --  21.2*  NEUTROABS 5.8  --   --  21.6*  --   --   --   --   HGB 8.1* 8.7* 8.9* 9.5* 9.5* 6.1* 10.1* 10.2*  HCT 26.4* 28.6* 29.3* 29.9* 28.0* 18.0* 33.0* 33.3*  MCV 97.1 98.6 101.4* 98.7  --   --   --  100.3*  PLT 415* 332 367 503*  --   --   --  459*    Basic Metabolic Panel: Recent Labs  Lab 06/23/24 0459 06/25/24 0430 06/27/24 1453 06/27/24 1500 06/27/24 1549 06/27/24 1901 06/28/24 0247  NA 140 142 134* 139 141 139 137  K 3.9 4.4 3.3* 3.4* 2.1* 3.0*  3.4*  CL 107 109 105 105  --  112* 109  CO2 21* 22 22  --   --  17* 14*  GLUCOSE 212* 123* 106* 104*  --  59* 90  BUN 18 24* 26* 29*  --  27* 25*  CREATININE 2.24* 2.35* 2.60* 2.70*  --  2.36* 2.31*  CALCIUM  7.5* 7.9* 7.1*  --   --  7.0* 7.2*  MG  --   --   --   --   --  1.8  --    GFR: Estimated Creatinine Clearance: 23 mL/min (A) (by C-G formula based on SCr of 2.31 mg/dL (H)). Recent Labs  Lab 06/23/24 0459 06/25/24 0430 06/27/24 1453 06/27/24 1501 06/28/24 0247  WBC 9.8 13.5* 24.6*  --  21.2*  LATICACIDVEN  --   --   --  1.2  --     Liver Function Tests: Recent Labs  Lab 06/27/24 1453 06/28/24 0247  AST 15 17  ALT <5 <5  ALKPHOS 115 112  BILITOT 0.4 0.6  PROT 4.6* 4.4*  ALBUMIN  <1.5* <1.5*   No results for input(s): LIPASE, AMYLASE in the last 168 hours. Recent Labs  Lab 06/27/24 1457  AMMONIA 33    ABG    Component Value Date/Time   PHART 7.362 06/27/2024 1549   PCO2ART 31.2 (L) 06/27/2024 1549   PO2ART 439 (H) 06/27/2024 1549   HCO3 17.7 (L) 06/27/2024 1549   TCO2 19 (L) 06/27/2024 1549   ACIDBASEDEF 7.0 (H) 06/27/2024 1549   O2SAT 100 06/27/2024 1549     Coagulation Profile: No results for input(s): INR, PROTIME in the last 168 hours.  Cardiac Enzymes: No results for input(s): CKTOTAL, CKMB, CKMBINDEX, TROPONINI in the last 168 hours.  HbA1C: Hgb A1c MFr Bld  Date/Time Value Ref Range Status  06/16/2024 07:26 PM 5.8 (H) 4.8 - 5.6 % Final    Comment:    (NOTE)         Prediabetes: 5.7 - 6.4         Diabetes: >6.4         Glycemic control for adults with diabetes: <7.0   04/12/2022 09:28 AM 7.0 (H) 4.8 - 5.6 % Final    Comment:    (NOTE) Pre diabetes:          5.7%-6.4%  Diabetes:              >6.4%  Glycemic control for   <7.0% adults with diabetes     CBG: Recent Labs  Lab 06/27/24 2003 06/27/24 2148  06/27/24 2345 06/28/24 0340 06/28/24 0722  GLUCAP 102* 91 92 99 102*    The patient is critically ill  due to septic shock due to bilateral multifocal pneumonia requiring titration of vasopressors.  Critical care was necessary to treat or prevent imminent or life-threatening deterioration.  Critical care was time spent personally by me on the following activities: development of treatment plan with patient and/or surrogate as well as nursing, discussions with consultants, evaluation of patient's response to treatment, examination of patient, obtaining history from patient or surrogate, ordering and performing treatments and interventions, ordering and review of laboratory studies, ordering and review of radiographic studies, pulse oximetry, re-evaluation of patient's condition and participation in multidisciplinary rounds.   During this encounter critical care time was devoted to patient care services described in this note for 44 minutes.     Valinda Novas, MD [facility] See Amion for pager If no response to pager, please call [phone] until 7pm After 7pm, Please call E-link [phone]

## 2024-06-28 NOTE — Consult Note (Addendum)
 WOC Nurse Consult Note: this patient is known to WOC team from previous admission  Reason for Consult: multiple wounds  Wound type: 1.  R heel Unstageable Pressure Injury 80% black eschar 20% pink  2.  L heel Deep Tissue Pressure Injury purple maroon discoloration  3.  Full thickness R posterior leg pink  4. Partial thickness mid back pink  5.  Deep Tissue Pressure Injury sacrum purple maroon discoloration  6.  Partial thickness skin loss B shins pink moist, some scabbed 7. Stage 3 Pressure Injury R ankle/lower anterior leg 50% pink 50% yellow (from old traction)  8.  Midline abdomen healing surgical incision  Pressure Injury POA: Yes B heels and sacrum/ankle  Measurement:see nursing flowsheet  Wound bed: as above  Drainage (amount, consistency, odor) see nursing flowsheet  Periwound: erythema to buttocks  Dressing procedure/placement/frequency:  Cleanse R heel with Vashe wound cleanser Soila [phone]) do not rinse and allow to air dry. Apply Medihoney to wound bed daily, cover with dry gauze and secure with Kerlix roll gauze.  Cleanse L heel with soap and water , dry and apply Xeroform gauze Soila [phone]) to purple discoloration daily.  Secure with silicone foam. Place B feet in Prevalon boots to offload pressure.  Cleanse sacrum/buttocks with soap and water , dry and apply Xeroform gauze Soila [phone]) to purple discoloration daily. Secure with silicone foam.  Cleanse R posterior leg, mid back and B shin wounds with Vashe, cover with Xeroform gauze and secure with silicone foam.  Cleanse R ankle/lower leg wound with Vashe, using a Q tip applicator apply silver hydrofiber ([place]son [phone] Aquacel [employer]) to wound bed daily and secure with silicone foam.  POC discussed with bedside nurse. WOC team will not follow. Re-consult if further needs arise.   Thank you,    Powell Bar MSN, RN-BC, [employer]

## 2024-06-28 NOTE — Progress Notes (Signed)
 NEUROLOGY CONSULT FOLLOW UP NOTE   Date of service: June 28, 2024 Patient Name: Jake Bartlett MRN:  [id number] DOB:  02/04/1941  Interval Hx/subjective  Patient continues to require vasopressors to maintain blood pressure within goals.  Per RN, he has been able to intermittently follow commands.  MRI is pending. Vitals   Vitals:   06/28/24 0915 06/28/24 0930 06/28/24 0945 06/28/24 1000  BP: (!) 79/53 (!) 107/48 103/64 (!) 91/59  Pulse: (!) 103 (!) 104  (!) 118  Resp: 19 (!) 23 (!) 23 (!) 26  Temp: 99.5 F (37.5 C) 99.7 F (37.6 C) 99.9 F (37.7 C) 99.7 F (37.6 C)  TempSrc:      SpO2: 100% 100%  100%     There is no height or weight on file to calculate BMI.  Physical Exam   Constitutional: Ill-appearing, intubated elderly patient in no acute distress Eyes: No scleral injection.  HENT: Endotracheal tube in place Head: Normocephalic.  Cardiovascular: Normal rate and regular rhythm.  Respiratory: Respirations synchronous with ventilator Skin: Bandages on multiple wounds  Neurologic Examination   (Fentanyl  stopped earlier today) patient does not respond to name or follow commands, resists eye opening but pupils equal round reactive to light, does not track examiner.  Cough and gag present, flickers left upper extremity and bilateral lower extremities to noxious.  Medications  Current Facility-Administered Medications:    0.9 %  sodium chloride  infusion, 250 mL, Intravenous, Continuous, Simpson, Vina B, NP   Chlorhexidine  Gluconate Cloth 2 % PADS 6 each, 6 each, Topical, Daily, Antonetta Vina B, NP, 6 each at 06/27/24 1829   fentaNYL  (SUBLIMAZE ) bolus via infusion 25-100 mcg, 25-100 mcg, Intravenous, Q15 min PRN, Jerrol Agent, MD, 25 mcg at 06/28/24 0105   fentaNYL  in NS (15mcg/ml) infusion-PREMIX, 0-400 mcg/hr, Intravenous, Continuous, Jerrol Agent, MD, Stopped at 06/28/24 0805   heparin  injection 5,000 Units, 5,000 Units, Subcutaneous, Q8H, Chand,  Sudham, MD, 5,000 Units at 06/28/24 0853   hydrocortisone  sodium succinate  (SOLU-CORTEF ) 100 MG injection 100 mg, 100 mg, Intravenous, Q12H, Chand, Sudham, MD, 100 mg at 06/28/24 0900   lactated ringers  infusion, , Intravenous, Continuous, Chand, Valinda, MD, Last Rate: 75 mL/hr at 06/28/24 0849, New Bag at 06/28/24 0849   leptospermum manuka honey (MEDIHONEY) paste 1 Application, 1 Application, Topical, Daily, Pawar, Rahul, MD, 1 Application at 06/28/24 1015   norepinephrine  (LEVOPHED ) 4mg  in (0.016 mg/mL) premix infusion, 0-15 mcg/min, Intravenous, Titrated, Claudene Fonda BROCKS, NP, Last Rate: 45 mL/hr at 06/28/24 0850, 12 mcg/min at 06/28/24 0850   Oral care mouth rinse, 15 mL, Mouth Rinse, Q2H, Simpson, Paula B, NP, 15 mL at 06/28/24 1000   Oral care mouth rinse, 15 mL, Mouth Rinse, PRN, Antonetta Vina B, NP   pantoprazole  (PROTONIX ) injection 40 mg, 40 mg, Intravenous, Daily, Antonetta Vina B, NP, 40 mg at 06/28/24 0900   piperacillin -tazobactam (ZOSYN ) IVPB 3.375 g, 3.375 g, Intravenous, Q8H, Uhlorn, Garett M, RPH, Last Rate: 12.5 mL/hr at 06/28/24 0600, Infusion Verify at 06/28/24 0600   thiamine  (VITAMIN B1) injection 100 mg, 100 mg, Intravenous, Daily, Antonetta Vina B, NP, 100 mg at 06/28/24 0900   vasopressin  (PITRESSIN) 20 Units in 100 mL (0.2 unit/mL) infusion-*FOR SHOCK*, 0-0.03 Units/min, Intravenous, Continuous, Harold Valinda, MD  Labs and Diagnostic Imaging   CBC:  Recent Labs  Lab 06/27/24 1453 06/27/24 1500 06/28/24 0247 06/28/24 0819  WBC 24.6*  --  21.2*  --   NEUTROABS 21.6*  --   --   --  HGB 9.5*   < > 10.2* 9.2*  HCT 29.9*   < > 33.3* 27.0*  MCV 98.7  --  100.3*  --   PLT 503*  --  459*  --    < > = values in this interval not displayed.    Basic Metabolic Panel:  Lab Results  Component Value Date   NA 146 (H) 06/28/2024   K 4.5 06/28/2024   CO2 14 (L) 06/28/2024   GLUCOSE 90 06/28/2024   BUN 25 (H) 06/28/2024   CREATININE 2.31 (H) 06/28/2024    CALCIUM  7.2 (L) 06/28/2024   GFRNONAA 27 (L) 06/28/2024   Lipid Panel:  Lab Results  Component Value Date   LDLCALC 67 04/13/2022   HgbA1c:  Lab Results  Component Value Date   HGBA1C 5.8 (H) 06/16/2024   Urine Drug Screen: No results found for: LABOPIA, COCAINSCRNUR, LABBENZ, AMPHETMU, THCU, LABBARB  Alcohol Level     Component Value Date/Time   [facility] <15 06/27/2024 1901   INR  Lab Results  Component Value Date   INR 1.1 04/12/2022   APTT  Lab Results  Component Value Date   APTT 32 04/12/2022    CT Head without contrast(Personally reviewed): No acute abnormality  CT angio Head and Neck with contrast(Personally reviewed): No LVO, severe right A1 stenosis, severe left P1-P2 junction stenosis, 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 No seizures or epileptiform discharges were seen throughout the recording.  Assessment   Jake Bartlett is a 83 y.o. male with history of diabetes, hyperlipidemia, ICH and recent colon resection for adenocarcinoma who presented after being found unresponsive at his skilled nursing facility.  Glucose was in the 20s for EMS, and D50 was administered.  He was intubated in the emergency department and was noted to have left-sided weakness.  CT head revealed no acute abnormality, and CTA revealed no LVO.  He was found to have aspiration pneumonia with sepsis and has been treated with appropriate antibiotics.  Patient continues to remain on the ventilator in the ICU and requires pressors to maintain blood pressure within control.  Patient will require MRI, will need to perform without contrast due to renal function.  Will proceed with stroke workup if positive.  Recommendations  -MRI brain without contrast, commence stroke workup if positive -Given weakness and residence in  skilled nursing facility, will send B12, folate and thiamine , continue thiamine  supplementation - Neurology will continue to follow ______________________________________________________________________ Patient seen by NP and then by MD, MD to edit note as needed.  Signed, Corlis Angelica E Everitt Clint Kill, NP [facility]   NEUROHOSPITALIST ADDENDUM Performed a face to face diagnostic evaluation.   I have reviewed the contents of history and physical exam as documented by PA/ARNP/Resident and agree with above documentation.  I have discussed and formulated the above plan as documented. Edits to the note have been made as needed.  Impression/Key exam findings/Plan: intubated but non focal. Brings both arms up to his face with nares stimulation and withdraws BL lowers. If MRI brain is negative, no further workup.  Salman Khaliqdina, MD [facility] [phone]   If 7pm to 7am, please call on call as listed on AMION.

## 2024-06-28 NOTE — Procedures (Signed)
 Central Venous Catheter Insertion Procedure Note  Jake Bartlett  [id number]  12/26/1940  Date:06/28/24  Time:9:54 AM   Provider Performing:Stiven Kaspar   Procedure: Insertion of Non-tunneled Central Venous [phone]) with US  guidance (23062)   Indication(s) Medication administration  Consent Risks of the procedure as well as the alternatives and risks of each were explained to the patient and/or caregiver.  Consent for the procedure was obtained and is signed in the bedside chart  Anesthesia Topical only with 1% lidocaine    Timeout Verified patient identification, verified procedure, site/side was marked, verified correct patient position, special equipment/implants available, medications/allergies/relevant history reviewed, required imaging and test results available.  Sterile Technique Maximal sterile technique including full sterile barrier drape, hand hygiene, sterile gown, sterile gloves, mask, hair covering, sterile ultrasound probe cover (if used).  Procedure Description Area of catheter insertion was cleaned with chlorhexidine  and draped in sterile fashion.  With real-time ultrasound guidance a central venous catheter was placed into the left subclavian vein. Nonpulsatile blood flow and easy flushing noted in all ports.  The catheter was sutured in place and sterile dressing applied.  Complications/Tolerance None; patient tolerated the procedure well. Chest X-ray is ordered to verify placement for internal jugular or subclavian cannulation.   Chest x-ray is not ordered for femoral cannulation.  EBL Minimal  Specimen(s) None

## 2024-06-28 NOTE — Progress Notes (Signed)
 eLink Physician-Brief Progress Note Patient Name: Jake Bartlett DOB: 16-Jun-1941 MRN: [id number]   Date of Service  06/28/2024  HPI/Events of Note  K 3.4  eICU Interventions  replaced     Intervention Category Intermediate Interventions: Electrolyte abnormality - evaluation and management  CLAUDENE AGENT, P 06/28/2024, 5:29 AM

## 2024-06-28 NOTE — TOC CM/SW Note (Signed)
 Transition of Care [facility]) - Inpatient Brief Assessment   Patient Details  Name: Sagar Tengan MRN: [id number] Date of Birth: [birth year]  Transition of Care [facility]) CM/SW Contact:    Tom-Johnson, Harvest Muskrat, RN Phone Number: 06/28/2024, 10:11 AM   Clinical Narrative:  Patient presented to the ED with Unresponsiveness 2/2 Hypoglycemia. Patient's CBG was in the 20s on EMS arrival. Patient was intubated for Airway protection. Patient is from [facility] and the plan  is to return at discharge.   Patient not Medically ready for discharge.  CM will continue to follow as patient progresses with care towards discharge.          Transition of Care Asessment:

## 2024-06-29 DIAGNOSIS — G928 Other toxic encephalopathy: Secondary | ICD-10-CM

## 2024-06-29 DIAGNOSIS — E43 Unspecified severe protein-calorie malnutrition: Secondary | ICD-10-CM | POA: Insufficient documentation

## 2024-06-29 DIAGNOSIS — R6521 Severe sepsis with septic shock: Secondary | ICD-10-CM | POA: Diagnosis not present

## 2024-06-29 DIAGNOSIS — A419 Sepsis, unspecified organism: Secondary | ICD-10-CM | POA: Diagnosis not present

## 2024-06-29 DIAGNOSIS — J9601 Acute respiratory failure with hypoxia: Secondary | ICD-10-CM | POA: Diagnosis not present

## 2024-06-29 DIAGNOSIS — J189 Pneumonia, unspecified organism: Secondary | ICD-10-CM | POA: Diagnosis not present

## 2024-06-29 LAB — URINALYSIS, W/ REFLEX TO CULTURE (INFECTION SUSPECTED)
Bilirubin Urine: NEGATIVE
Glucose, UA: 50 mg/dL — AB
Ketones, ur: NEGATIVE mg/dL
Nitrite: NEGATIVE
Protein, ur: 100 mg/dL — AB
Specific Gravity, Urine: 1.028 (ref 1.005–1.030)
pH: 5 (ref 5.0–8.0)

## 2024-06-29 LAB — GLUCOSE, CAPILLARY
Glucose-Capillary: 219 mg/dL — ABNORMAL HIGH (ref 70–99)
Glucose-Capillary: 268 mg/dL — ABNORMAL HIGH (ref 70–99)
Glucose-Capillary: 295 mg/dL — ABNORMAL HIGH (ref 70–99)
Glucose-Capillary: 275 mg/dL — ABNORMAL HIGH (ref 70–99)
Glucose-Capillary: 172 mg/dL — ABNORMAL HIGH (ref 70–99)
Glucose-Capillary: 92 mg/dL (ref 70–99)

## 2024-06-29 LAB — BASIC METABOLIC PANEL WITH GFR
Anion gap: 11 (ref 5–15)
BUN: 29 mg/dL — ABNORMAL HIGH (ref 8–23)
CO2: 17 mmol/L — ABNORMAL LOW (ref 22–32)
Calcium: 7 mg/dL — ABNORMAL LOW (ref 8.9–10.3)
Chloride: 108 mmol/L (ref 98–111)
Creatinine, Ser: 2.82 mg/dL — ABNORMAL HIGH (ref 0.61–1.24)
GFR, Estimated: 21 mL/min — ABNORMAL LOW (ref >60)
Glucose, Bld: 246 mg/dL — ABNORMAL HIGH (ref 70–99)
Potassium: 4.1 mmol/L (ref 3.5–5.1)
Sodium: 136 mmol/L (ref 135–145)

## 2024-06-29 LAB — CBC
HCT: 27.2 % — ABNORMAL LOW (ref 39.0–52.0)
Hemoglobin: 8.4 g/dL — ABNORMAL LOW (ref 13.0–17.0)
MCH: 30.4 pg (ref 26.0–34.0)
MCHC: 30.9 g/dL (ref 30.0–36.0)
MCV: 98.6 fL (ref 80.0–100.0)
Platelets: 526 K/uL — ABNORMAL HIGH (ref 150–400)
RBC: 2.76 MIL/uL — ABNORMAL LOW (ref 4.22–5.81)
RDW: 18 % — ABNORMAL HIGH (ref 11.5–15.5)
WBC: 16.2 K/uL — ABNORMAL HIGH (ref 4.0–10.5)
nRBC: 0 % (ref 0.0–0.2)

## 2024-06-29 LAB — MAGNESIUM: Magnesium: 1.6 mg/dL — ABNORMAL LOW (ref 1.7–2.4)

## 2024-06-29 LAB — PHOSPHORUS: Phosphorus: 3.5 mg/dL (ref 2.5–4.6)

## 2024-06-29 MED ORDER — INSULIN ASPART 100 UNIT/ML IJ SOLN
0.0000 [IU] | INTRAMUSCULAR | Status: DC
  Administered 2024-06-29: 11 [IU] via SUBCUTANEOUS
  Administered 2024-06-29: 4 [IU] via SUBCUTANEOUS
  Administered 2024-06-29: 11 [IU] via SUBCUTANEOUS
  Administered 2024-06-30: 4 [IU] via SUBCUTANEOUS
  Administered 2024-07-01: 7 [IU] via SUBCUTANEOUS
  Administered 2024-07-01 (×3): 4 [IU] via SUBCUTANEOUS
  Administered 2024-07-01 – 2024-07-02 (×3): 7 [IU] via SUBCUTANEOUS
  Administered 2024-07-02: 4 [IU] via SUBCUTANEOUS
  Administered 2024-07-02 – 2024-07-03 (×5): 7 [IU] via SUBCUTANEOUS
  Administered 2024-07-03: 4 [IU] via SUBCUTANEOUS
  Administered 2024-07-03: 11 [IU] via SUBCUTANEOUS
  Administered 2024-07-03: 3 [IU] via SUBCUTANEOUS
  Administered 2024-07-04: 4 [IU] via SUBCUTANEOUS
  Administered 2024-07-04 (×2): 7 [IU] via SUBCUTANEOUS
  Administered 2024-07-04 (×2): 4 [IU] via SUBCUTANEOUS
  Administered 2024-07-04 – 2024-07-05 (×4): 7 [IU] via SUBCUTANEOUS

## 2024-06-29 MED ORDER — ORAL CARE MOUTH RINSE: 15.0000 mL | OROMUCOSAL | Status: DC | PRN

## 2024-06-29 MED ORDER — SODIUM BICARBONATE 8.4 % IV SOLN
100.0000 meq | Freq: Once | INTRAVENOUS | Status: AC
  Administered 2024-06-29: 100 meq via INTRAVENOUS
  Filled 2024-06-29: qty 100

## 2024-06-29 MED ORDER — INSULIN GLARGINE 100 UNIT/ML ~~LOC~~ SOLN
10.0000 [IU] | Freq: Every day | SUBCUTANEOUS | Status: DC
  Administered 2024-06-29: 10 [IU] via SUBCUTANEOUS
  Filled 2024-06-29 (×2): qty 0.1

## 2024-06-29 MED ORDER — CALCIUM GLUCONATE-NACL 1-0.675 GM/50ML-% IV SOLN
1.0000 g | Freq: Once | INTRAVENOUS | Status: AC
  Administered 2024-06-29: 1000 mg via INTRAVENOUS
  Filled 2024-06-29: qty 50

## 2024-06-29 MED ORDER — ORAL CARE MOUTH RINSE
15.0000 mL | OROMUCOSAL | Status: DC
  Administered 2024-06-29: 15 mL via OROMUCOSAL

## 2024-06-29 MED ORDER — MAGNESIUM SULFATE 2 GM/50ML IV SOLN: 2.0000 g | Freq: Once | INTRAVENOUS | Status: DC

## 2024-06-29 MED ORDER — MAGNESIUM SULFATE 2 GM/50ML IV SOLN
2.0000 g | Freq: Once | INTRAVENOUS | Status: AC
  Administered 2024-06-29: 2 g via INTRAVENOUS
  Filled 2024-06-29: qty 50

## 2024-06-29 NOTE — Procedures (Signed)
 Extubation Procedure Note  Patient Details:   Name: Jake Bartlett DOB: 04/19/1941 MRN: [id number]   Airway Documentation:    Vent end date: 06/29/24 Vent end time: 1122   Evaluation  O2 sats: stable throughout Complications: No apparent complications Patient did tolerate procedure well. Bilateral Breath Sounds: Diminished   Yes,  Per CCM order, RT extubated pt to [place]. Prior to extubation, pt did have positive cuff leak. Pt tolerated well with SVS and no stridor noted. Pt was able to state his name.  Catherene Kaleta R 06/29/2024, 11:25 AM

## 2024-06-29 NOTE — Progress Notes (Signed)
 NEUROLOGY CONSULT FOLLOW UP NOTE   Date of service: June 29, 2024 Patient Name: Jake Bartlett MRN:  [id number] DOB:  02/27/1941  Interval Hx/subjective  Patient continues to require vasopressors to maintain blood pressure within goals.  MRI performed overnight was negative.  He is able to follow commands with all 4 extremities today. Vitals   Vitals:   06/29/24 0530 06/29/24 0545 06/29/24 0740 06/29/24 0747  BP: (!) 101/54 (!) 101/56    Pulse: 69 67  69  Resp: 16 16    Temp:      TempSrc:   Bladder   SpO2: 100% 100%    Weight:         Body mass index is 27.82 kg/m.  Physical Exam   Constitutional: Ill-appearing, intubated elderly patient in no acute distress Eyes: No scleral injection.  HENT: Endotracheal tube in place Head: Normocephalic.  Cardiovascular: Normal rate and regular rhythm.  Respiratory: Respirations synchronous with ventilator Skin: Bandages on multiple wounds  Neurologic Examination   (On no sedation) patient regards examiner when his name is spoken and is able to follow commands with all 4 extremities.  He has antigravity strength of bilateral upper extremities but does not move lower extremities off the bed.  Pupils are equal round and reactive to light and extraocular movements are intact.  He is able to nod appropriately to questions.  Medications  Current Facility-Administered Medications:    carbidopa -levodopa  (SINEMET  IR) 25-250 MG per tablet immediate release 1 tablet, 1 tablet, Per Tube, Q8H, Chand, Valinda, MD, 1 tablet at 06/29/24 0646   Chlorhexidine  Gluconate Cloth 2 % PADS 6 each, 6 each, Topical, Daily, Antonetta Moccasin B, NP, 6 each at 06/28/24 1200   escitalopram  (LEXAPRO ) tablet 10 mg, 10 mg, Per Tube, Daily, Chand, Sudham, MD, 10 mg at 06/29/24 0954   feeding supplement (PROSource TF20) liquid 60 mL, 60 mL, Per Tube, Daily, Chand, Sudham, MD, 60 mL at 06/29/24 0954   feeding supplement (VITAL 1.5 CAL) liquid 1,000 mL, 1,000 mL, Per  Tube, Continuous, Harold Valinda, MD, Stopped at 06/29/24 0747   fentaNYL  (SUBLIMAZE ) bolus via infusion 25-100 mcg, 25-100 mcg, Intravenous, Q15 min PRN, Jerrol Agent, MD, 25 mcg at 06/29/24 0205   fentaNYL  in NS (80mcg/ml) infusion-PREMIX, 0-400 mcg/hr, Intravenous, Continuous, Jerrol Agent, MD, Stopped at 06/29/24 0745   heparin  injection 5,000 Units, 5,000 Units, Subcutaneous, Q8H, Chand, Sudham, MD, 5,000 Units at 06/29/24 0645   hydrocortisone  sodium succinate  (SOLU-CORTEF ) 100 MG injection 100 mg, 100 mg, Intravenous, Q12H, Chand, Sudham, MD, 100 mg at 06/29/24 0954   insulin  aspart (novoLOG ) injection 2-6 Units, 2-6 Units, Subcutaneous, Q4H, Dela Cruz, Anton M, MD, 6 Units at 06/29/24 0747   leptospermum manuka honey (MEDIHONEY) paste 1 Application, 1 Application, Topical, Daily, Pawar, Rahul, MD, 1 Application at 06/28/24 1015   multivitamin with minerals tablet 1 tablet, 1 tablet, Per Tube, Daily, Chand, Valinda, MD, 1 tablet at 06/29/24 0954   norepinephrine  (LEVOPHED ) 4mg  in (0.016 mg/mL) premix infusion, 0-15 mcg/min, Intravenous, Titrated, Claudene Chew C, NP, Last Rate: 45 mL/hr at 06/29/24 0745, 12 mcg/min at 06/29/24 0745   Oral care mouth rinse, 15 mL, Mouth Rinse, Q2H, Simpson, Paula B, NP, 15 mL at 06/29/24 0646   Oral care mouth rinse, 15 mL, Mouth Rinse, PRN, Antonetta Moccasin B, NP   pantoprazole  (PROTONIX ) injection 40 mg, 40 mg, Intravenous, Daily, Antonetta Moccasin B, NP, 40 mg at 06/29/24 9046   piperacillin -tazobactam (ZOSYN ) IVPB 3.375 g, 3.375 g, Intravenous,  Q8H, Marten Peat M, RPH, Last Rate: 12.5 mL/hr at 06/29/24 0645, 3.375 g at 06/29/24 9354   pramipexole  (MIRAPEX ) tablet 0.25 mg, 0.25 mg, Per Tube, QHS, Chand, Valinda, MD, 0.25 mg at 06/28/24 2254   sodium bicarbonate  tablet 650 mg, 650 mg, Per Tube, TID, Harold Valinda, MD, 650 mg at 06/29/24 0953   thiamine  (VITAMIN B1) injection 100 mg, 100 mg, Intravenous, Daily, Antonetta Moccasin B, NP, 100 mg at  06/29/24 0954  Labs and Diagnostic Imaging   CBC:  Recent Labs  Lab 06/27/24 1453 06/27/24 1500 06/28/24 0247 06/28/24 0819 06/29/24 0240  WBC 24.6*  --  21.2*  --  16.2*  NEUTROABS 21.6*  --   --   --   --   HGB 9.5*   < > 10.2* 9.2* 8.4*  HCT 29.9*   < > 33.3* 27.0* 27.2*  MCV 98.7  --  100.3*  --  98.6  PLT 503*  --  459*  --  526*   < > = values in this interval not displayed.    Basic Metabolic Panel:  Lab Results  Component Value Date   NA 136 06/29/2024   K 4.1 06/29/2024   CO2 17 (L) 06/29/2024   GLUCOSE 246 (H) 06/29/2024   BUN 29 (H) 06/29/2024   CREATININE 2.82 (H) 06/29/2024   CALCIUM  7.0 (L) 06/29/2024   GFRNONAA 21 (L) 06/29/2024   Lipid Panel:  Lab Results  Component Value Date   LDLCALC 67 04/13/2022   HgbA1c:  Lab Results  Component Value Date   HGBA1C 5.8 (H) 06/16/2024   Urine Drug Screen: No results found for: LABOPIA, COCAINSCRNUR, LABBENZ, AMPHETMU, THCU, LABBARB  Alcohol Level     Component Value Date/Time   [facility] <15 06/27/2024 1901   INR  Lab Results  Component Value Date   INR 1.1 04/12/2022   APTT  Lab Results  Component Value Date   APTT 32 04/12/2022  Thiamine  pending B12 1049 Folate 16.9  CT Head without contrast(Personally reviewed): No acute abnormality  CT angio Head and Neck with contrast(Personally reviewed): No LVO, severe right A1 stenosis, severe left P1-P2 junction stenosis, 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 No seizures or epileptiform discharges were seen throughout the recording.  Assessment   Jake Bartlett is a 83 y.o. male with history of diabetes, hyperlipidemia, ICH and recent colon resection for adenocarcinoma who presented after being found  unresponsive at his skilled nursing facility.  Glucose was in the 20s for EMS, and D50 was administered.  He was intubated in the emergency department and was noted to have left-sided weakness.  CT head revealed no acute abnormality, and CTA revealed no LVO.  He was found to have aspiration pneumonia with sepsis and has been treated with appropriate antibiotics.  Patient continues to remain on the ventilator in the ICU and requires pressors to maintain blood pressure within control.  MRI brain reveals no acute abnormality, and patient's mental status is much improved today with him being able to follow commands with all 4 extremities and nod appropriately to questions.  Hopeful plan is for extubation today.  Given negative MRI and EEG and unrevealing lab work, suspect that altered mental status was due to toxic metabolic encephalopathy in the setting of sepsis.  Recommendations  -Continue thiamine  supplementation and await  result of lab - Neurology will sign off, please call with questions or concerns ______________________________________________________________________ Patient seen by NP and then by MD, MD to edit note as needed.  Signed, Cortney E Everitt Clint Kill, NP [facility]   NEUROHOSPITALIST ADDENDUM Performed a face to face diagnostic evaluation.   I have reviewed the contents of history and physical exam as documented by PA/ARNP/Resident and agree with above documentation.  I have discussed and formulated the above plan as documented. Edits to the note have been made as needed.  Lakisha Peyser, MD [facility] [phone]   If 7pm to 7am, please call on call as listed on AMION.

## 2024-06-29 NOTE — Progress Notes (Signed)
 NAME:  Jake Bartlett, MRN:  [id number], DOB:  19-Mar-1941, LOS: 2 ADMISSION DATE:  06/27/2024, CONSULTATION DATE:  9/9 REFERRING MD:  EDP, CHIEF COMPLAINT:  AMS   History of Present Illness:  Pt encephalopathic, therefore HPI obtained from EMR.    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  Outpatient oncology follow up arranged.     Presented with altered mental status, reported hypoglycemic with EMS with GCS 7 treated but mental status did not improve.  Some concern for worsening LUE weakness, stroke team activated on arrival.  Intubated for airway protection.  Hypotensive post intubation, treated with 3L and started on peripheral levophed . Taken for [facility] and CTA head/ neck neg, CTA chest/ abd/ pelvis neg for aneurysm or dissection, noted to have moderate amount of free air in abd, thought post-surgical related and bibasilar mucous plugging and consolidation with small bilateral pleural effusions.  Labs noted for WBC 24.6, H/H 9.5/29, K 3.3, glucose 106, BUN/ sCr 26/ 2.6 (previously 24/ 2.35), low albumin / protein, ammonia 33, lactic 1.2.  Pending UA/ UC.  Blood cultures sent, cefepime, flagyl  and vancomycin  ordered.  PCCM called for admit.   Pertinent  Medical History  T2DM, High cholesterol, prior stroke  Significant Hospital Events: Including procedures, antibiotic start and stop dates in addition to other pertinent events   9/7 admitted with septic shock likely due to aspiration pneumonia on vasopressor support  9/8 central line placed for ongoing pressor support, tube feeds initiated  Interim History / Subjective:  BG elevated with initiation of tube feeds, started moderate SSI. White cell count continues to improved 21 > 16 BCID ngtd,  Resp culture with GPRs and yeast on gram stain, culture pending  Objective    Blood pressure (!) 101/56, pulse 67, temperature 97.9 F (36.6 C), temperature source Axillary, resp. rate 16, weight 83 kg, SpO2 100%.    Vent Mode: PRVC FiO2 (%):  [40 %] 40 % Set Rate:  [16 bmp] 16 bmp Vt Set:  [540 mL] 540 mL PEEP:  [5 cmH20] 5 cmH20 Pressure Support:  [8 cmH20-10 cmH20] 8 cmH20 Plateau Pressure:  [11 cmH20-21 cmH20] 17 cmH20   Intake/Output Summary (Last 24 hours) at 06/29/2024 0707 Last data filed at 06/29/2024 0500 Gross per 24 hour  Intake 2894.95 ml  Output 350 ml  Net 2544.95 ml   Filed Weights   06/29/24 0445  Weight: 83 kg    Examination: General: intubated, ill appearing, no distress HENT: EOMI, moist mucous membranes Lungs: good air movement bilaterally, CTAB Cardiovascular: RRR, no m/r/g Abdomen: soft, nontender Extremities: warm, well perfused Neuro: Awake to voice, following commands   Resolved problem list   Assessment and Plan  Septic shock due to bilateral multifocal pneumonia, likely aspiration Patient continues to require vasopressor support, titrate with MAP of 65 Continue IV fluid Continue broad-spectrum antibiotic, with Zosyn  Follow-up culture and adjust antibiotic accordingly   Acute respiratory failure with hypoxia due to bilateral multifocal pneumonia, resolving Patient tolerated spontaneous breathing trial this AM, mentating appropriately and able to follow commands.  -extubated    Acute septic encephalopathy Patient mental status is improving Continue supportive care   Prior right thalamic ICH, with residual left deficit Repeat CT head is negative for acute findings Continue supportive care   CKD  stage IV Hypokalemia  Hypocalcemia  Creatinine at baseline Continue IV fluid Avoid nephrotoxic agent Continue aggressive electrolyte replacement   Anemia of chronic disease Monitor H&H and transfuse if less than 7 H&H trending down slowly,  8.4 likely dilutional    Well-controlled diabetes type 2 with recurrent hypoglycemia Patient hemoglobin A1c is 5.8 Required D50 boluses Fingersticks consistently >200, increased to resistant SSI   HTN Hold antihypertensive meds considering shock Add back when off pressors and pressures elevated   Multiple pressure injuries, POA 1.  R heel Unstageable Pressure Injury 80% black eschar 20% pink  2.  L heel Deep Tissue Pressure Injury purple maroon discoloration  3.  Full thickness R posterior leg pink  4. Partial thickness mid back pink  5.  Deep Tissue Pressure Injury sacrum purple maroon discoloration  6.  Partial thickness skin loss B shins pink moist, some scabbed 7. Stage 3 Pressure Injury R ankle/lower anterior leg 50% pink 50% yellow (from old traction)  8.  Midline abdomen healing surgical incision  Wound care is following   Colon adenocarcinoma  Recent cecal mass s/p R hemicolectomy 9/2  Output oncology f/u as planned with Dr. Cloretta    Labs   CBC: Recent Labs  Lab 06/23/24 0459 06/25/24 0430 06/27/24 1453 06/27/24 1500 06/27/24 1549 06/27/24 1901 06/28/24 0247 06/28/24 0819 06/29/24 0240  WBC 9.8 13.5* 24.6*  --   --   --  21.2*  --  16.2*  NEUTROABS  --   --  21.6*  --   --   --   --   --   --   HGB 8.7* 8.9* 9.5*   < > 6.1* 10.1* 10.2* 9.2* 8.4*  HCT 28.6* 29.3* 29.9*   < > 18.0* 33.0* 33.3* 27.0* 27.2*  MCV 98.6 101.4* 98.7  --   --   --  100.3*  --  98.6  PLT 332 367 503*  --   --   --  459*  --  526*   < > = values in this interval not displayed.    Basic Metabolic Panel: Recent Labs  Lab 06/25/24 0430 06/27/24 1453 06/27/24 1500 06/27/24 1549 06/27/24 1901 06/28/24 0247 06/28/24 0819 06/29/24 0240  NA 142 134* 139 141 139 137 146* 136  K 4.4 3.3* 3.4* 2.1* 3.0* 3.4* 4.5 4.1  CL 109 105 105  --  112* 109  --  108  CO2 22 22  --   --  17* 14*  --  17*  GLUCOSE 123* 106* 104*  --  59* 90  --  246*  BUN 24* 26* 29*  --  27* 25*  --  29*   CREATININE 2.35* 2.60* 2.70*  --  2.36* 2.31*  --  2.82*  CALCIUM  7.9* 7.1*  --   --  7.0* 7.2*  --  7.0*  MG  --   --   --   --  1.8  --   --  1.6*  PHOS  --   --   --   --   --   --   --  3.5   GFR: Estimated Creatinine Clearance: 20.5 mL/min (A) (by C-G formula based on SCr of 2.82 mg/dL (H)). Recent Labs  Lab 06/25/24 0430 06/27/24 1453 06/27/24 1501 06/28/24 0247 06/29/24 0240  WBC 13.5* 24.6*  --  21.2* 16.2*  LATICACIDVEN  --   --  1.2  --   --     Liver Function Tests: Recent Labs  Lab 06/27/24 1453 06/28/24 0247  AST 15 17  ALT <5 <5  ALKPHOS 115 112  BILITOT 0.4 0.6  PROT 4.6* 4.4*  ALBUMIN  <1.5* <1.5*   No results for input(s): LIPASE, AMYLASE in the last 168 hours. Recent Labs  Lab 06/27/24 1457  AMMONIA 33    ABG    Component Value Date/Time   PHART 7.587 (H) 06/28/2024 0819   PCO2ART 35.3 06/28/2024 0819   PO2ART 112 (H) 06/28/2024 0819   HCO3 33.5 (H) 06/28/2024 0819   TCO2 35 (H) 06/28/2024 0819   ACIDBASEDEF 7.0 (H) 06/27/2024 1549   O2SAT 99 06/28/2024 0819     Coagulation Profile: No results for input(s): INR, PROTIME in the last 168 hours.  Cardiac Enzymes: No results for input(s): CKTOTAL, CKMB, CKMBINDEX, TROPONINI in the last 168 hours.  HbA1C: Hgb A1c MFr Bld  Date/Time Value Ref Range Status  06/16/2024 07:26 PM 5.8 (H) 4.8 - 5.6 % Final    Comment:    (NOTE)         Prediabetes: 5.7 - 6.4         Diabetes: >6.4         Glycemic control for adults with diabetes: <7.0   04/12/2022 09:28 AM 7.0 (H) 4.8 - 5.6 % Final    Comment:    (NOTE) Pre diabetes:          5.7%-6.4%  Diabetes:              >6.4%  Glycemic control for   <7.0% adults with diabetes     CBG: Recent Labs  Lab 06/28/24 1101 06/28/24 1600 06/28/24 1919 06/28/24 2328 06/29/24 0309  GLUCAP 135* 185* 192* 232* 219*    Review of Systems:   Limited due to intubation  Past Medical History:  He,  has a past medical history of  Diabetes mellitus without complication (HCC), High cholesterol, and Stroke (HCC).   Surgical History:   Past Surgical History:  Procedure Laterality Date   BACK SURGERY     BONE BIOPSY  06/20/2024   Procedure: BIOPSY, GI;  Surgeon: Saintclair Jasper, MD;  Location: [facility];  Service: Gastroenterology;;   COLONOSCOPY N/A 06/20/2024   Procedure: COLONOSCOPY;  Surgeon: Saintclair Jasper, MD;  Location: [facility];  Service: Gastroenterology;  Laterality: N/A;   HIP SURGERY     LAPAROSCOPIC PARTIAL COLECTOMY N/A 06/22/2024   Procedure: LAPAROSCOPIC RIGHT COLECTOMY;  Surgeon: Teresa Lonni HERO, MD;  Location: [facility];  Service: General;  Laterality: N/A;   PENILE PROSTHESIS IMPLANT  08/14/2006   AMS Penile Prosthesis; Performed by MYRTIS Stalling MD     Social History:   reports that he has never smoked. He has never been exposed to tobacco smoke. He has never used smokeless tobacco. He reports that he does not drink alcohol and does not use drugs.   Family History:  His family history is not on file.   Allergies Allergies  Allergen Reactions   Ropinirole Anaphylaxis and Swelling    Angioedema   Neurontin [Gabapentin] Other (See Comments)    Drowsiness     Home Medications  Prior to Admission medications   Medication Sig Start Date End Date Taking? Authorizing Provider  acetaminophen  (TYLENOL ) 500 MG tablet Take 1,000 mg by mouth every 8 (eight) hours as needed for mild pain (pain score 1-3).   Yes [provider]  B Complex Vitamins (VITAMIN B COMPLEX) TABS Take 1 tablet by mouth daily.   Yes [provider]  carbidopa -levodopa  (SINEMET  IR) 25-250 MG tablet Take 1 tablet by mouth in the morning, at noon, and at bedtime. 01/09/22  Yes [provider]  carvedilol (COREG) 3.125 MG tablet Take 3.125 mg by mouth in the morning.   Yes [provider]  Cholecalciferol  (VITAMIN D3) 10 MCG (400 UNIT) tablet Take 400 Units by mouth daily.   Yes [provider]  escitalopram  (LEXAPRO ) 10 MG tablet Take 10 mg by mouth daily.   Yes [provider]  folic acid  (FOLVITE ) 1 MG tablet Take 1 mg by mouth daily. 11/23/21  Yes [provider]  Multiple Vitamin (MULTIVITAMIN) tablet Take 1 tablet by mouth daily.   Yes [provider]  NOVOLIN 70/30 KWIKPEN (70-30) 100 UNIT/ML KwikPen Inject 10-20 Units into the skin See admin instructions. Inject 20 units into the skin every morning and inject 10 units at bedtime.   Yes [provider]  oxyCODONE  (OXY IR/ROXICODONE ) 5 MG immediate release tablet Take 1 tablet (5 mg total) by mouth every 4 (four) hours as needed for moderate pain (pain score 4-6). 06/25/24  Yes Adhikari, Ivonne, MD  pramipexole  (MIRAPEX ) 0.25 MG tablet Take 0.25 mg by mouth at bedtime.   Yes [provider]  rosuvastatin  (CRESTOR ) 10 MG tablet Take 10 mg by mouth at bedtime. 01/16/22  Yes [provider]  sodium bicarbonate  650 MG tablet Take 1 tablet (650 mg total) by mouth 3 (three) times daily. 06/25/24  Yes Jillian Ivonne, MD  tamsulosin  (FLOMAX ) 0.4 MG CAPS capsule Take 1 capsule (0.4 mg total) by mouth daily. 02/24/22  Yes Towana Ozell BROCKS, MD  feeding supplement (ENSURE PLUS HIGH PROTEIN) LIQD Take 237 mLs by mouth 2 (two) times daily between meals. Patient not taking: Reported on 06/28/2024 06/25/24   Adhikari, Amrit, MD  Nystatin (GERHARDT'S BUTT CREAM) CREA Apply 1 Application topically 3 (three) times daily. Patient not taking: Reported on 06/28/2024 06/25/24   Jillian Ivonne, MD     Critical care time:

## 2024-06-29 NOTE — Progress Notes (Signed)
 eLink Physician-Brief Progress Note Patient Name: Jake Bartlett DOB: [birth year] MRN: [id number]   Date of Service  06/29/2024  HPI/Events of Note  Notified of tachycardia 140s appears junctional, regular. SBP dropped to the 60s then 80s. Fentanyl  push given as patient signified he was uncomfortable. HR eventually came down after > 30 minutes with improvement in BP  eICU Interventions  Labs drawn Calcium  gluconate to be given for iCa of 0.99 yesterday Elink to be informed of lab results Discussed with [facility]     Intervention Category Intermediate Interventions: Arrhythmia - evaluation and management  [name] [name] 06/29/2024, 2:46 AM

## 2024-06-29 NOTE — Progress Notes (Signed)
 eLink Physician-Brief Progress Note Patient Name: Jake Bartlett DOB: December 01, 1940 MRN: [id number]   Date of Service  06/29/2024  HPI/Events of Note  Magnesium  1.6 EGFR 21 Rhythm sinus at this time  eICU Interventions  Ordered Magnesium  sulfate 1 g IVPB     Intervention Category Intermediate Interventions: Electrolyte abnormality - evaluation and management  Damien ONEIDA Grout 06/29/2024, 3:53 AM

## 2024-06-30 ENCOUNTER — Inpatient Hospital Stay (HOSPITAL_COMMUNITY)

## 2024-06-30 ENCOUNTER — Other Ambulatory Visit: Payer: Self-pay

## 2024-06-30 DIAGNOSIS — E876 Hypokalemia: Secondary | ICD-10-CM

## 2024-06-30 DIAGNOSIS — J9601 Acute respiratory failure with hypoxia: Secondary | ICD-10-CM

## 2024-06-30 DIAGNOSIS — J9600 Acute respiratory failure, unspecified whether with hypoxia or hypercapnia: Secondary | ICD-10-CM

## 2024-06-30 LAB — CBC
HCT: 23.4 % — ABNORMAL LOW (ref 39.0–52.0)
HCT: 24.6 % — ABNORMAL LOW (ref 39.0–52.0)
Hemoglobin: 7.4 g/dL — ABNORMAL LOW (ref 13.0–17.0)
Hemoglobin: 7.8 g/dL — ABNORMAL LOW (ref 13.0–17.0)
MCH: 30.6 pg (ref 26.0–34.0)
MCH: 30.8 pg (ref 26.0–34.0)
MCHC: 31.6 g/dL (ref 30.0–36.0)
MCHC: 31.7 g/dL (ref 30.0–36.0)
MCV: 96.7 fL (ref 80.0–100.0)
MCV: 97.2 fL (ref 80.0–100.0)
Platelets: 460 K/uL — ABNORMAL HIGH (ref 150–400)
Platelets: 389 K/uL (ref 150–400)
RBC: 2.42 MIL/uL — ABNORMAL LOW (ref 4.22–5.81)
RBC: 2.53 MIL/uL — ABNORMAL LOW (ref 4.22–5.81)
RDW: 17.7 % — ABNORMAL HIGH (ref 11.5–15.5)
RDW: 18 % — ABNORMAL HIGH (ref 11.5–15.5)
WBC: 13.5 K/uL — ABNORMAL HIGH (ref 4.0–10.5)
WBC: 11.3 K/uL — ABNORMAL HIGH (ref 4.0–10.5)
nRBC: 0 % (ref 0.0–0.2)
nRBC: 0 % (ref 0.0–0.2)

## 2024-06-30 LAB — RAPID URINE DRUG SCREEN, HOSP PERFORMED
Amphetamines: NOT DETECTED
Barbiturates: NOT DETECTED
Benzodiazepines: NOT DETECTED
Cocaine: NOT DETECTED
Opiates: POSITIVE — AB
Tetrahydrocannabinol: NOT DETECTED

## 2024-06-30 LAB — BASIC METABOLIC PANEL WITH GFR
Anion gap: 9 (ref 5–15)
BUN: 33 mg/dL — ABNORMAL HIGH (ref 8–23)
CO2: 23 mmol/L (ref 22–32)
Calcium: 7.4 mg/dL — ABNORMAL LOW (ref 8.9–10.3)
Chloride: 106 mmol/L (ref 98–111)
Creatinine, Ser: 2.68 mg/dL — ABNORMAL HIGH (ref 0.61–1.24)
GFR, Estimated: 23 mL/min — ABNORMAL LOW (ref >60)
Glucose, Bld: 111 mg/dL — ABNORMAL HIGH (ref 70–99)
Potassium: 3.8 mmol/L (ref 3.5–5.1)
Sodium: 138 mmol/L (ref 135–145)

## 2024-06-30 LAB — GLUCOSE, CAPILLARY
Glucose-Capillary: 88 mg/dL (ref 70–99)
Glucose-Capillary: 89 mg/dL (ref 70–99)
Glucose-Capillary: 99 mg/dL (ref 70–99)
Glucose-Capillary: 99 mg/dL (ref 70–99)
Glucose-Capillary: 111 mg/dL — ABNORMAL HIGH (ref 70–99)
Glucose-Capillary: 165 mg/dL — ABNORMAL HIGH (ref 70–99)

## 2024-06-30 LAB — URINE CULTURE: Culture: NO GROWTH

## 2024-06-30 LAB — POTASSIUM: Potassium: 3.4 mmol/L — ABNORMAL LOW (ref 3.5–5.1)

## 2024-06-30 LAB — PHOSPHORUS: Phosphorus: 3 mg/dL (ref 2.5–4.6)

## 2024-06-30 LAB — MAGNESIUM: Magnesium: 2 mg/dL (ref 1.7–2.4)

## 2024-06-30 LAB — STREP PNEUMONIAE URINARY ANTIGEN: Strep Pneumo Urinary Antigen: NEGATIVE

## 2024-06-30 MED ORDER — HYDROCORTISONE SOD SUC (PF) 100 MG IJ SOLR
100.0000 mg | Freq: Every day | INTRAMUSCULAR | Status: DC
  Administered 2024-06-30 – 2024-07-01 (×2): 100 mg via INTRAVENOUS
  Filled 2024-06-30 (×2): qty 2

## 2024-06-30 MED ORDER — MIDODRINE HCL 5 MG PO TABS
10.0000 mg | ORAL_TABLET | Freq: Three times a day (TID) | ORAL | Status: DC
Start: 2024-06-30 — End: 2024-06-30
  Administered 2024-06-30 (×2): 10 mg via ORAL
  Filled 2024-06-30 (×2): qty 2

## 2024-06-30 MED ORDER — ADULT MULTIVITAMIN W/MINERALS CH
1.0000 | ORAL_TABLET | Freq: Every day | ORAL | Status: DC
  Administered 2024-06-30: 1 via ORAL

## 2024-06-30 MED ORDER — ALBUMIN HUMAN 25 % IV SOLN
25.0000 g | Freq: Four times a day (QID) | INTRAVENOUS | Status: AC
  Administered 2024-06-30 – 2024-07-01 (×3): 25 g via INTRAVENOUS
  Filled 2024-06-30 (×3): qty 100

## 2024-06-30 MED ORDER — VITAL HP 1.0 CAL PO LIQD
1000.0000 mL | ORAL | Status: DC
Start: 2024-06-30 — End: 2024-06-30

## 2024-06-30 MED ORDER — SODIUM BICARBONATE 650 MG PO TABS
650.0000 mg | ORAL_TABLET | Freq: Three times a day (TID) | ORAL | Status: DC
  Administered 2024-06-30 (×3): 650 mg via ORAL
  Filled 2024-06-30 (×2): qty 1

## 2024-06-30 MED ORDER — SODIUM BICARBONATE 650 MG PO TABS
650.0000 mg | ORAL_TABLET | Freq: Three times a day (TID) | ORAL | Status: DC
  Administered 2024-07-01 – 2024-07-05 (×13): 650 mg
  Filled 2024-06-30 (×13): qty 1

## 2024-06-30 MED ORDER — FUROSEMIDE 10 MG/ML IJ SOLN
80.0000 mg | Freq: Once | INTRAMUSCULAR | Status: AC
Start: 2024-06-30 — End: 2024-06-30
  Administered 2024-06-30: 80 mg via INTRAVENOUS
  Filled 2024-06-30: qty 8

## 2024-06-30 MED ORDER — CARBIDOPA-LEVODOPA 25-250 MG PO TABS
1.0000 | ORAL_TABLET | Freq: Three times a day (TID) | ORAL | Status: DC
  Administered 2024-06-30 (×2): 1 via ORAL
  Filled 2024-06-30 (×3): qty 1

## 2024-06-30 MED ORDER — POTASSIUM CHLORIDE 10 MEQ/100ML IV SOLN
10.0000 meq | INTRAVENOUS | Status: AC
Start: 2024-06-30 — End: 2024-06-30
  Administered 2024-06-30 (×4): 10 meq via INTRAVENOUS
  Filled 2024-06-30 (×4): qty 100

## 2024-06-30 MED ORDER — VITAL 1.5 CAL PO LIQD
1000.0000 mL | ORAL | Status: DC
  Administered 2024-06-30 – 2024-07-04 (×4): 1000 mL
  Filled 2024-06-30 (×7): qty 1000

## 2024-06-30 MED ORDER — MIDODRINE HCL 5 MG PO TABS
10.0000 mg | ORAL_TABLET | Freq: Three times a day (TID) | ORAL | Status: AC
  Administered 2024-07-01: 10 mg
  Filled 2024-06-30: qty 2

## 2024-06-30 MED ORDER — ADULT MULTIVITAMIN W/MINERALS CH
1.0000 | ORAL_TABLET | Freq: Every day | ORAL | Status: DC
  Administered 2024-07-01 – 2024-07-05 (×5): 1
  Filled 2024-06-30 (×5): qty 1

## 2024-06-30 MED ORDER — CARBIDOPA-LEVODOPA 25-250 MG PO TABS
1.0000 | ORAL_TABLET | Freq: Three times a day (TID) | ORAL | Status: DC
  Administered 2024-07-01 – 2024-07-05 (×13): 1
  Filled 2024-06-30 (×15): qty 1

## 2024-06-30 NOTE — Progress Notes (Signed)
 Nutrition Follow Up  DOCUMENTATION CODES:  Severe malnutrition in context of acute illness/injury - while patient meets criteria for malnutrition in the context of acute illness, it is likely that a number of his comorbidities that are chronic in nature are actively contributing   INTERVENTION:  Initiate tube feeding via Cortrak after xray confirms placement: Vital 1.5 at 50 ml/h (1200 ml per day) Initiate at 20ml/hr and increase by 10ml/hr q8h until goal rate achieved Prosource TF20 60 ml daily  Provides 1880 kcal, 101 gm protein, 917 ml free water  daily   Monitor magnesium , potassium, and phosphorus daily for at least 3 days, MD to replete as needed  Add Thiamine  100 mg daily for 5 days  Add MVI w/ minerals  NUTRITION DIAGNOSIS:  Severe Malnutrition related to acute illness as evidenced by percent weight loss, moderate muscle depletion. Remains applicable  GOAL:  Patient will meet greater than or equal to 90% of their needs Progressing w/ Cortrak placement and TF initiation  MONITOR:  TF tolerance, Diet advancement, I & O's, Labs  REASON FOR ASSESSMENT:  Consult Assessment of nutrition requirement/status, Enteral/tube feeding initiation and management  ASSESSMENT:   Pt with PMH significant for: Parkinson's disease, HTN, rith thalamic ICH w/ left-sided weakness,T2DM and stroke. Of note, recent admission for cecal mass which was found to be adenocarcinoma. S/p hemicolectomy on 9/02. Presented with AMS and hypoglycemia. Code stroke activated and patient subsequently intubated. Admitted for septic shock 2/2 aspiration PNA.  Previous Admissions 6/06 - R hip repair 8/27 - 9/05: admitted to [facility] found to have adenocarcinoma of colon 9/02 - hemicolectomy Current Admission 9/7 admitted; intubated 9/8 central line placed, TF initiated 9/9 pt extubated, OG removed 9/10 failed bedside SLP eval due to lethargy, continued NPO, Cortrak placed  Spoke with son on phone about pt's  bedside swallow eval results. Son and wife worried about pt's nutrition status and want to pursue Cortrak placement to supplement nutrition while pt remains lethargic. SLP will continue to follow up as lethargy improves to reassess swallow safety. Discussed Cortrak placement and initiating tube feedings with care team and family. Will titrate tube feeds to goal slowly as pt previously never made it to goal and electrolyte labs shifted for refeeding. Will monitor for tolerance and any change in status.  Weight Trends:  9/2: 78.9 kg  9/9: 83 kg 9/10: 86 kg  *currently on lasix  with mild to moderate pitting edema*  Drains/Lines: Cortrak  Foley catheter UOP: 415 ml x24 hours  Meds: furosemide , SSI Novolog  q 4 hr, MVI w/ minerals, midodrine , pantoprazole , sodium bicarbonate  TID, thiamine  IV 100 mg  Drips: Levo 11.25 ml/h (low dose) IV ABX Potassium chloride  10 mEq  Labs: CBG x 24 hr: 88-275 mg/dL Potassium 6.5<--5.8<--5.4<--6.5<--7.8 Phosphorus 3.0<--3.5 Magnesium  2.0<--1.6<--1.8 BUN 29/creatinine 2.82 A1c 5.8  Diet Order:   Diet Order             Diet NPO time specified  Diet effective now             EDUCATION NEEDS:   Not appropriate for education at this time  Skin:  Skin Assessment: Skin Integrity Issues: Skin Integrity Issues:: Unstageable, DTI, Stage III DTI: L heel, medial sacrum Stage III: R ankle; lower anterior leg Unstageable: R heel  Per WOC note 9/08 Wound type:  1.  R heel Unstageable Pressure Injury 80% black eschar 20% pink  2.  L heel Deep Tissue Pressure Injury purple maroon discoloration  3.  Full thickness R posterior leg pink  4. Partial thickness mid back pink  5.  Deep Tissue Pressure Injury sacrum purple maroon discoloration  6.  Partial thickness skin loss B shins pink moist, some scabbed 7. Stage 3 Pressure Injury R ankle/lower anterior leg 50% pink 50% yellow (from old traction)  8.  Midline abdomen healing surgical incision  Pressure  Injury POA: Yes B heels and sacrum/ankle  Measurement:see nursing flowsheet  Wound bed: as above  Drainage (amount, consistency, odor) see nursing flowsheet  Periwound: erythema to buttocks   Last BM:  9/9 type 7  Height:  Ht Readings from Last 1 Encounters:  06/22/24 5' 8 (1.727 m)   Weight:  Wt Readings from Last 1 Encounters:  06/30/24 86 kg   Ideal Body Weight:  70 kg  BMI:  Body mass index is 28.83 kg/m.  Estimated Nutritional Needs:   Kcal:  1700-1900 kcals  Protein:  90-105g  Fluid:  1.7-1.9L/day    Josette Glance, MS, RDN, LDN Clinical Dietitian I Please reach out via secure chat

## 2024-06-30 NOTE — Progress Notes (Signed)
 The proposed treatment discussed in conference is for discussion purpose only and is not a binding recommendation.  The patients have not been physically examined, or presented with their treatment options.  Therefore, final treatment plans cannot be decided.

## 2024-06-30 NOTE — Progress Notes (Signed)
 NAME:  Jake Bartlett, MRN:  [id number], DOB:  08-01-1941, LOS: 3 ADMISSION DATE:  06/27/2024, CONSULTATION DATE:  9/10 REFERRING MD:  EDP, CHIEF COMPLAINT:  AMS   History of Present Illness:  Pt encephalopathic, therefore HPI obtained from EMR.    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  Outpatient oncology follow up arranged.     Presented with altered mental status, reported hypoglycemic with EMS with GCS 7 treated but mental status did not improve.  Some concern for worsening LUE weakness, stroke team activated on arrival.  Intubated for airway protection.  Hypotensive post intubation, treated with 3L and started on peripheral levophed . Taken for [facility] and CTA head/ neck neg, CTA chest/ abd/ pelvis neg for aneurysm or dissection, noted to have moderate amount of free air in abd, thought post-surgical related and bibasilar mucous plugging and consolidation with small bilateral pleural effusions.  Labs noted for WBC 24.6, H/H 9.5/29, K 3.3, glucose 106, BUN/ sCr 26/ 2.6 (previously 24/ 2.35), low albumin / protein, ammonia 33, lactic 1.2.  Pending UA/ UC.  Blood cultures sent, cefepime, flagyl  and vancomycin  ordered.  PCCM called for admit.   Pertinent  Medical History  T2DM, High cholesterol, prior stroke   Significant Hospital Events: Including procedures, antibiotic start and stop dates in addition to other pertinent events   9/7 admitted with septic shock likely due to aspiration pneumonia on vasopressor support  9/8 central line placed for ongoing pressor support, tube feeds initiated 9/9 patient extubated  Interim History / Subjective:  Stable post extubation No acute ovn events  Objective    Blood pressure 110/60, pulse (!) 56,  temperature (!) 97.5 F (36.4 C), temperature source Axillary, resp. rate 11, weight 86 kg, SpO2 100%.    FiO2 (%):  [40 %] 40 %   Intake/Output Summary (Last 24 hours) at 06/30/2024 0750 Last data filed at 06/30/2024 0600 Gross per 24 hour  Intake 1421.41 ml  Output 415 ml  Net 1006.41 ml   Filed Weights   06/29/24 0445 06/30/24 0458  Weight: 83 kg 86 kg    Examination: General: Elderly, ill-appearing gentleman no distress HENT: Moist mucous membranes, PERRLA, EOMI Lungs: Minimal coarse crackles heard in bilateral lung bases, no increased work of breathing Cardiovascular: Bradycardic, no M/R/G.  Heart rate increases appropriately with stimulation Abdomen: Flat, soft, nontender Extremities: Grossly edematous in the bilateral upper extremity.  Warm and well-perfused in all 4 extremities Neuro: Awakens to voice, oriented to self, time, place and situation.  Follows commands appropriately  Resolved problem list  Acute respiratory failure with hypoxia due to bilateral multifocal pneumonia Acute septic encephalopathy Assessment and Plan  Septic shock due to bilateral multifocal pneumonia, likely aspiration Maintaining appropriate pressures without Levo this AM Trial off pressors today, would start midodrine  if passes swallow study Continue Zosyn , total 10 days   Prior right thalamic ICH, with residual left deficit Repeat CT head is negative for acute findings Continue supportive care PT/SLP eval for mobility and swallow assessments start coretrack if fails swallow   CKD stage IV Hypokalemia  Hypocalcemia  Decreased urine output Creatinine at baseline Continue IV fluid Avoid nephrotoxic agent Continue aggressive electrolyte replacement Trial Lasix  80 mg IV today for edema Repeat bladder scan today to assess for retention  Anemia of chronic disease Monitor H&H and transfuse if less than 7 H&H trending down slowly, 7.4 repeat CBC today   Well-controlled diabetes type 2  with recurrent hypoglycemia Patient hemoglobin A1c is 5.8 Required D50 boluses Fingersticks consistently >200, increased to resistant SSI   HTN Hold antihypertensive meds, BP still soft despite appropriate MAP Add back as appropriate   Multiple pressure injuries, POA 1.  R heel Unstageable Pressure Injury 80% black eschar 20% pink  2.  L heel Deep Tissue Pressure Injury purple maroon discoloration  3.  Full thickness R posterior leg pink  4. Partial thickness mid back pink  5.  Deep Tissue Pressure Injury sacrum purple maroon discoloration  6.  Partial thickness skin loss B shins pink moist, some scabbed 7. Stage 3 Pressure Injury R ankle/lower anterior leg 50% pink 50% yellow (from old traction)  8.  Midline abdomen healing surgical incision  Wound care is following   Colon adenocarcinoma  Recent cecal mass s/p R hemicolectomy 9/2  Output oncology f/u as planned with Dr. Cloretta    Labs   CBC: Recent Labs  Lab 06/25/24 0430 06/27/24 1453 06/27/24 1500 06/27/24 1901 06/28/24 0247 06/28/24 0819 06/29/24 0240 06/30/24 0450  WBC 13.5* 24.6*  --   --  21.2*  --  16.2* 13.5*  NEUTROABS  --  21.6*  --   --   --   --   --   --   HGB 8.9* 9.5*   < > 10.1* 10.2* 9.2* 8.4* 7.4*  HCT 29.3* 29.9*   < > 33.0* 33.3* 27.0* 27.2* 23.4*  MCV 101.4* 98.7  --   --  100.3*  --  98.6 96.7  PLT 367 503*  --   --  459*  --  526* 460*   < > = values in this interval not displayed.    Basic Metabolic Panel: Recent Labs  Lab 06/25/24 0430 06/27/24 1453 06/27/24 1500 06/27/24 1549 06/27/24 1901 06/28/24 0247 06/28/24 0819 06/29/24 0240 06/30/24 0450  NA 142 134* 139 141 139 137 146* 136  --   K 4.4 3.3* 3.4* 2.1* 3.0* 3.4* 4.5 4.1 3.4*  CL 109 105 105  --  112* 109  --  108  --   CO2 22 22  --   --  17* 14*  --  17*  --   GLUCOSE 123* 106* 104*  --  59* 90  --  246*  --   BUN 24* 26* 29*  --  27* 25*  --  29*  --   CREATININE 2.35* 2.60* 2.70*  --  2.36* 2.31*  --  2.82*  --    CALCIUM  7.9* 7.1*  --   --  7.0* 7.2*  --  7.0*  --   MG  --   --   --   --  1.8  --   --  1.6* 2.0  PHOS  --   --   --   --   --   --   --  3.5 3.0   GFR: Estimated Creatinine Clearance: 20.8 mL/min (A) (by C-G formula based on SCr of 2.82 mg/dL (H)). Recent Labs  Lab 06/27/24 1453 06/27/24 1501 06/28/24 0247 06/29/24 0240 06/30/24 0450  WBC 24.6*  --  21.2* 16.2* 13.5*  LATICACIDVEN  --  1.2  --   --   --     Liver Function Tests: Recent Labs  Lab 06/27/24 1453 06/28/24 0247  AST 15 17  ALT <5 <5  ALKPHOS 115 112  BILITOT 0.4 0.6  PROT 4.6* 4.4*  ALBUMIN  <1.5* <1.5*   No results for input(s): LIPASE, AMYLASE in the last 168 hours. Recent Labs  Lab 06/27/24 1457  AMMONIA 33    ABG    Component Value Date/Time   PHART 7.587 (H) 06/28/2024 0819   PCO2ART 35.3 06/28/2024 0819   PO2ART 112 (H) 06/28/2024 0819   HCO3 33.5 (H) 06/28/2024 0819   TCO2 35 (H) 06/28/2024 0819   ACIDBASEDEF 7.0 (H) 06/27/2024 1549   O2SAT 99 06/28/2024 0819     Coagulation Profile: No results for input(s): INR, PROTIME in the last 168 hours.  Cardiac Enzymes: No results for input(s): CKTOTAL, CKMB, CKMBINDEX, TROPONINI in the last 168 hours.  HbA1C: Hgb A1c MFr Bld  Date/Time Value Ref Range Status  06/16/2024 07:26 PM 5.8 (H) 4.8 - 5.6 % Final    Comment:    (NOTE)         Prediabetes: 5.7 - 6.4         Diabetes: >6.4         Glycemic control for adults with diabetes: <7.0   04/12/2022 09:28 AM 7.0 (H) 4.8 - 5.6 % Final    Comment:    (NOTE) Pre diabetes:          5.7%-6.4%  Diabetes:              >6.4%  Glycemic control for   <7.0% adults with diabetes     CBG: Recent Labs  Lab 06/29/24 1125 06/29/24 1518 06/29/24 1927 06/29/24 2314 06/30/24 0313  GLUCAP 295* 275* 172* 92 88    Review of Systems:   Denies pain or difficulty breathing  Past Medical History:  He,  has a past medical history of Diabetes mellitus without complication  (HCC), High cholesterol, and Stroke (HCC).   Surgical History:   Past Surgical History:  Procedure Laterality Date   BACK SURGERY     BONE BIOPSY  06/20/2024   Procedure: BIOPSY, GI;  Surgeon: Saintclair Jasper, MD;  Location: [facility];  Service: Gastroenterology;;   COLONOSCOPY N/A 06/20/2024   Procedure: COLONOSCOPY;  Surgeon: Saintclair Jasper, MD;  Location: [facility];  Service: Gastroenterology;  Laterality: N/A;   HIP SURGERY     LAPAROSCOPIC PARTIAL COLECTOMY N/A 06/22/2024   Procedure: LAPAROSCOPIC RIGHT COLECTOMY;  Surgeon: Teresa Lonni HERO, MD;  Location: [facility];  Service: General;  Laterality: N/A;   PENILE PROSTHESIS IMPLANT  08/14/2006   AMS Penile Prosthesis; Performed by MYRTIS Stalling MD     Social History:   reports that he has never smoked. He has never been exposed to tobacco smoke. He has never used smokeless tobacco. He reports that he does not drink alcohol and does not use drugs.   Family History:  His family history is not on file.   Allergies Allergies  Allergen Reactions   Ropinirole Anaphylaxis and Swelling    Angioedema   Neurontin [Gabapentin] Other (See Comments)    Drowsiness     Home Medications  Prior to Admission medications   Medication Sig Start Date End Date Taking? Authorizing Provider  acetaminophen  (TYLENOL ) 500 MG tablet Take 1,000 mg by mouth every 8 (eight) hours as needed for mild pain (pain score 1-3).   Yes [provider]  B Complex Vitamins (VITAMIN B COMPLEX) TABS Take 1 tablet by mouth daily.   Yes [provider]  carbidopa -levodopa  (SINEMET  IR) 25-250 MG tablet Take 1 tablet  by mouth in the morning, at noon, and at bedtime. 01/09/22  Yes [provider]  carvedilol (COREG) 3.125 MG tablet Take 3.125 mg by mouth in the morning.   Yes [provider]  Cholecalciferol  (VITAMIN D3) 10 MCG (400 UNIT) tablet Take 400 Units by mouth daily.   Yes [provider]  escitalopram  (LEXAPRO ) 10 MG  tablet Take 10 mg by mouth daily.   Yes [provider]  folic acid  (FOLVITE ) 1 MG tablet Take 1 mg by mouth daily. 11/23/21  Yes [provider]  Multiple Vitamin (MULTIVITAMIN) tablet Take 1 tablet by mouth daily.   Yes [provider]  NOVOLIN 70/30 KWIKPEN (70-30) 100 UNIT/ML KwikPen Inject 10-20 Units into the skin See admin instructions. Inject 20 units into the skin every morning and inject 10 units at bedtime.   Yes [provider]  oxyCODONE  (OXY IR/ROXICODONE ) 5 MG immediate release tablet Take 1 tablet (5 mg total) by mouth every 4 (four) hours as needed for moderate pain (pain score 4-6). 06/25/24  Yes Adhikari, Ivonne, MD  pramipexole  (MIRAPEX ) 0.25 MG tablet Take 0.25 mg by mouth at bedtime.   Yes [provider]  rosuvastatin  (CRESTOR ) 10 MG tablet Take 10 mg by mouth at bedtime. 01/16/22  Yes [provider]  sodium bicarbonate  650 MG tablet Take 1 tablet (650 mg total) by mouth 3 (three) times daily. 06/25/24  Yes Jillian Ivonne, MD  tamsulosin  (FLOMAX ) 0.4 MG CAPS capsule Take 1 capsule (0.4 mg total) by mouth daily. 02/24/22  Yes Towana Ozell BROCKS, MD  feeding supplement (ENSURE PLUS HIGH PROTEIN) LIQD Take 237 mLs by mouth 2 (two) times daily between meals. Patient not taking: Reported on 06/28/2024 06/25/24   Adhikari, Amrit, MD  Nystatin (GERHARDT'S BUTT CREAM) CREA Apply 1 Application topically 3 (three) times daily. Patient not taking: Reported on 06/28/2024 06/25/24   Jillian Ivonne, MD     Critical care time:

## 2024-06-30 NOTE — Procedures (Signed)
 Cortrak  Person Inserting Tube:  Mady Dolly, RD Tube Type:  Cortrak - 43 inches Tube Size:  10 Tube Location:  Right nare Secured by: Bridle Technique Used to Measure Tube Placement:  Marking at nare/corner of mouth Cortrak Secured At:  82 cm Initial Placement Verification:  Xray  Cortrak Tube Team Note:  Consult received to place a Cortrak feeding tube.   X-ray is required. RN may begin using tube post confirmation of placement.   If the tube becomes dislodged please keep the tube and contact the Cortrak team at [email or web address] for replacement.  If after hours and replacement cannot be delayed, place a NG tube and confirm placement with an abdominal x-ray.    Dolly Mady MS, RD, LDN Registered Dietitian Clinical Nutrition RD Inpatient Contact Info in Amion

## 2024-06-30 NOTE — Evaluation (Signed)
 Physical Therapy Evaluation Patient Details Name: Jake Bartlett MRN: [id number] DOB: Jul 21, 1941 Today's Date: 06/30/2024  History of Present Illness  83 yo M adm 06/27/24 from SNF unresponsive with hypoglycemia and sepsis. MRI (-) VDRF 9/7-9/9. PMHx: [facility] admission 8/27-06/25/24 with UTI and cecal mass. 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 Pt is currently limited in safe mobility by low BP (see General Comments), in presence of decreased strength and AROM, and increased fatigue. Pt is total Ax2 for bed mobility, and for coming to EoB and back to supine. PT recommends return to SNF level rehab at discharge. PT will continue to follow patient acutely.        If plan is discharge home, recommend the following: Assistance with cooking/housework;Help with stairs or ramp for entrance;Assist for transportation;Two people to help with walking and/or transfers;Two people to help with bathing/dressing/bathroom;Assistance with feeding;Direct supervision/assist for medications management;Direct supervision/assist for financial management   Can travel by private vehicle   No    Equipment Recommendations None recommended by PT     Functional Status Assessment Patient has had a recent decline in their functional status and demonstrates the ability to make significant improvements in function in a reasonable and predictable amount of time.     Precautions / Restrictions Precautions Precautions: Fall Restrictions Weight Bearing Restrictions Per Provider Order: No      Mobility  Bed Mobility Overal bed mobility: Needs Assistance Bed Mobility: Supine to Sit, Sit to  Supine     Supine to sit: Total assist, +2 for physical assistance, HOB elevated Sit to supine: Total assist, +2 for physical assistance, HOB elevated   General bed mobility comments: provided total Ax2 to get to EoB, pt able to follow command to reach with R UE towards bedrail, pt BP 74/46 (56) in seated, pt with closed eyes and limited response to question, returned to supine with totalAx2    Transfers                   General transfer comment: deferred due to low BP              Balance Overall balance assessment: Needs assistance Sitting-balance support: Bilateral upper extremity supported, Feet unsupported Sitting balance-Leahy Scale: Zero                                       Pertinent Vitals/Pain Pain Assessment Pain Assessment: Faces Faces Pain Scale: Hurts little more Pain Location: neck and shoulders Pain Descriptors / Indicators: Grimacing, Aching, Sore Pain Intervention(s): Limited activity within patient's tolerance, Monitored during session, Repositioned    Home Living Family/patient expects to be discharged to:: Skilled nursing facility                        Prior Function Prior Level of Function : Needs assist             Mobility Comments: 3 weeks ago was walking 40 feet with RW, hospitalized since then and was not able to work with therapy on return to SNF  ADLs Comments: help with all ADLs     Extremity/Trunk Assessment   Upper Extremity Assessment Upper Extremity Assessment: Defer to OT evaluation    Lower Extremity Assessment Lower Extremity Assessment: LLE deficits/detail;RLE deficits/detail RLE Deficits / Details: able to dorsi/plantar flex independently, unable to actively move knee or hip, attempte PROM and knee and hip very tight LLE Deficits / Details: Active movement at hip, knee and ankle, ROM limited due to stiffness,    Cervical / Trunk Assessment Cervical / Trunk Assessment:  Kyphotic Cervical / Trunk Exceptions: increased forward head posture, making sitting in bed uncomfortable  Communication   Communication Communication: Impaired Factors Affecting Communication: Hearing impaired    Cognition Arousal: Lethargic Behavior During Therapy: WFL for tasks assessed/performed   PT - Cognitive impairments: Difficult to assess Difficult to assess due to: Level of arousal                     PT - Cognition Comments: able to state name and date of birth, responds intermittently to yes/no questions with head nod Following commands: Intact       Cueing Cueing Techniques: Verbal cues, Gestural cues, Tactile cues, Visual cues     General Comments General comments (skin integrity, edema, etc.): BP in supine 98/48 (63), with sitting on EoB 74/46 (56) symptomatic, with return to supine 89/43 (58), wife Landon in room at end of session    Exercises General Exercises - Lower Extremity Ankle Circles/Pumps: AROM, Both, 5 reps, Supine   Assessment/Plan    PT Assessment Patient needs continued PT services  PT Problem List Decreased strength;Decreased range of motion;Decreased activity tolerance;Decreased balance;Decreased mobility;Decreased coordination;Cardiopulmonary status limiting activity       PT Treatment Interventions Functional mobility training;Therapeutic activities;Balance training;Therapeutic exercise;Patient/family education    PT Goals (Current goals can be found in the Care Plan section)  Acute Rehab PT Goals PT Goal Formulation: With family Time For Goal Achievement: 07/14/24 Potential to Achieve Goals: Fair    Frequency Min 1X/week        AM-PAC PT 6 Clicks Mobility  Outcome Measure Help needed turning from your back to your side while in a flat bed without using bedrails?: Total Help needed moving from lying on your back to sitting on the side of a flat bed without using bedrails?: Total Help needed moving to and from a bed to  a chair (including a wheelchair)?: Total Help needed standing up from a chair using your arms (e.g., wheelchair or bedside chair)?: Total Help needed to walk in hospital room?: Total Help needed climbing 3-5 steps with a railing? : Total 6 Click Score: 6    End of Session   Activity Tolerance: Treatment limited secondary to medical complications (Comment) (decreased BP) Patient left: in bed;with call bell/phone within reach;with bed alarm set Nurse Communication: Mobility status;Other (comment) (decreased BP) PT Visit Diagnosis: Muscle weakness (generalized) (M62.81);Difficulty in walking, not elsewhere classified (R26.2);Unsteadiness on feet (R26.81)    Time: 9074-9044 PT Time Calculation (min) (ACUTE ONLY): 30 min   Charges:   PT Evaluation $PT Eval Moderate Complexity: 1 Mod PT Treatments $Therapeutic Activity: 8-22 mins PT General Charges $$ ACUTE PT VISIT: 1 Visit         Hser Belanger B. Fleeta Lapidus PT, DPT Acute Rehabilitation Services Please use secure chat or  Call Office [phone]   Almarie KATHEE Fleeta [facility] 06/30/2024, 10:19 AM

## 2024-06-30 NOTE — Evaluation (Signed)
 Clinical/Bedside Swallow Evaluation Patient Details  Name: Jake Bartlett MRN: [id number] Date of Birth: [birth year]  Today's Date: 06/30/2024 Time: SLP Start Time (ACUTE ONLY): 0856 SLP Stop Time (ACUTE ONLY): 0922 SLP Time Calculation (min) (ACUTE ONLY): 26 min  Past Medical History:  Past Medical History:  Diagnosis Date   Diabetes mellitus without complication (HCC)    High cholesterol    Stroke [facility])    Past Surgical History:  Past Surgical History:  Procedure Laterality Date   BACK SURGERY     BONE BIOPSY  06/20/2024   Procedure: BIOPSY, GI;  Surgeon: Saintclair Jasper, MD;  Location: [facility];  Service: Gastroenterology;;   COLONOSCOPY N/A 06/20/2024   Procedure: COLONOSCOPY;  Surgeon: Saintclair Jasper, MD;  Location: [facility];  Service: Gastroenterology;  Laterality: N/A;   HIP SURGERY     LAPAROSCOPIC PARTIAL COLECTOMY N/A 06/22/2024   Procedure: LAPAROSCOPIC RIGHT COLECTOMY;  Surgeon: Teresa Lonni HERO, MD;  Location: [facility];  Service: General;  Laterality: N/A;   PENILE PROSTHESIS IMPLANT  08/14/2006   AMS Penile Prosthesis; Performed by MYRTIS Stalling MD   HPI:  Patient is an 83 y. o. 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 Recent hospitalization 8/27-9/5 discharged to SNF for UTI also found to have cecal mass s/p laparoscopic right hemicolectomy 9/2 with pathology consistent with adenocarcinoma. Presented with altered mental status on 9/7, reported hypoglycemic with EMS with GCS 7 treated but mental status did not improve. Intubated for airway protection on 9/7. Patient was extubated on 9/9. CTH and CTA head/neck neg, CTA chest/abd/pelvis neg for aneurysm or dissection. Patient is currently NPO. Bedside swallow evaluation ordered to assess patient's swallow function.    Assessment / Plan / Recommendation  Clinical Impression  Patient presented with lethargy/drowsiness. He was initially not verbally  responsive, only nodding his head yes or no. But as the session went on, patient was verbally responsive toward the end of the session. Patient was intubated from 9/7-9/9 and required cues to maintain a level of alterness appropriate for PO trials. His voice was hoarse and strained. Patient presented with a weak volitional cough. Oral care was provided. No overt s/s of penetration/aspiration observed across all PO trials. SLP trialed ice chips, sips of water  via spoon, and apple sauce. Patient presented with oral holding with the ice chips. Patient would swallow when cued by SLP. Patient had difficulty creating a bilabial seal around the spoon for water . Patient initiated a swallow with the applesauce. Patient continued to ask for applesauce. Recommend to stay on NPO diet with meds crushed in puree. Ice chips and water  with full supervision and delivered via spoon. SLP will continue to follow. SLP Visit Diagnosis: Dysphagia, unspecified (R13.10)    Aspiration Risk       Diet Recommendation NPO except meds;NPO (meds delivered via puree. Water  and ice chips fine with full supervision delivered via spoon.)    Liquid Administration via: Spoon Medication Administration: Crushed with puree Supervision: Full supervision/cueing for compensatory strategies Compensations: Minimize environmental distractions;Slow rate;Small sips/bites Postural Changes: Seated upright at 90 degrees;Remain upright for at least 30 minutes after po intake    Other  Recommendations Oral Care Recommendations: Oral care BID     Assistance Recommended at Discharge    Functional Status Assessment Patient has had a recent decline in their functional status and demonstrates the ability to make significant improvements in function in a reasonable and predictable amount of time.  Frequency and Duration min 2x/week  1 week       Prognosis Prognosis for improved oropharyngeal function: Good      Swallow Study   General Date of  Onset: 06/30/24 HPI: Patient is an 34 y. o. 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 Recent hospitalization 8/27-9/5 discharged to SNF for UTI also found to have cecal mass s/p laparoscopic right hemicolectomy 9/2 with pathology consistent with adenocarcinoma. Presented with altered mental status on 9/7, reported hypoglycemic with EMS with GCS 7 treated but mental status did not improve. Intubated for airway protection on 9/7. Patient was extubated on 9/9. CTH and CTA head/neck neg, CTA chest/abd/pelvis neg for aneurysm or dissection. Patient is currently NPO. Bedside swallow evaluation ordered to assess patient's swallow function. Type of Study: Bedside Swallow Evaluation Diet Prior to this Study: NPO Temperature Spikes Noted: No Respiratory Status: Nasal cannula History of Recent Intubation: Yes Total duration of intubation (days): 3 days Date extubated: 06/29/24 Behavior/Cognition: Alert;Lethargic/Drowsy;Requires cueing Oral Cavity Assessment: Within Functional Limits Oral Care Completed by SLP: Yes Oral Cavity - Dentition: Poor condition;Adequate natural dentition Patient Positioning: Upright in bed Baseline Vocal Quality: Low vocal intensity;Hoarse;Other (comment) (Strained vocal quality) Volitional Cough: Weak Volitional Swallow: Able to elicit    Oral/Motor/Sensory Function Overall Oral Motor/Sensory Function: Within functional limits   Ice Chips Ice chips: Within functional limits Presentation: Spoon   Thin Liquid Thin Liquid: Within functional limits Presentation: Spoon    Nectar Thick     Honey Thick     Puree Puree: Within functional limits Presentation: Spoon   Solid           Damien Hy  Graduate SLP Clinican

## 2024-06-30 NOTE — Plan of Care (Signed)
   Problem: Education: Goal: Ability to describe self-care measures that may prevent or decrease complications (Diabetes Survival Skills Education) will improve Outcome: Progressing Goal: Individualized Educational Video(s) Outcome: Progressing   Problem: Coping: Goal: Ability to adjust to condition or change in health will improve Outcome: Progressing   Problem: Fluid Volume: Goal: Ability to maintain a balanced intake and output will improve Outcome: Progressing   Problem: Health Behavior/Discharge Planning: Goal: Ability to identify and utilize available resources and services will improve Outcome: Progressing Goal: Ability to manage health-related needs will improve Outcome: Progressing   Problem: Metabolic: Goal: Ability to maintain appropriate glucose levels will improve Outcome: Progressing   Problem: Nutritional: Goal: Maintenance of adequate nutrition will improve Outcome: Progressing Goal: Progress toward achieving an optimal weight will improve Outcome: Progressing   Problem: Skin Integrity: Goal: Risk for impaired skin integrity will decrease Outcome: Progressing   Problem: Tissue Perfusion: Goal: Adequacy of tissue perfusion will improve Outcome: Progressing   Problem: Activity: Goal: Ability to tolerate increased activity will improve Outcome: Progressing   Problem: Respiratory: Goal: Ability to maintain a clear airway and adequate ventilation will improve Outcome: Progressing   Problem: Role Relationship: Goal: Method of communication will improve Outcome: Progressing   Problem: Education: Goal: Knowledge of General Education information will improve Description: Including pain rating scale, medication(s)/side effects and non-pharmacologic comfort measures Outcome: Progressing   Problem: Health Behavior/Discharge Planning: Goal: Ability to manage health-related needs will improve Outcome: Progressing   Problem: Clinical Measurements: Goal:  Ability to maintain clinical measurements within normal limits will improve Outcome: Progressing Goal: Will remain free from infection Outcome: Progressing Goal: Diagnostic test results will improve Outcome: Progressing Goal: Respiratory complications will improve Outcome: Progressing Goal: Cardiovascular complication will be avoided Outcome: Progressing   Problem: Activity: Goal: Risk for activity intolerance will decrease Outcome: Progressing   Problem: Nutrition: Goal: Adequate nutrition will be maintained Outcome: Progressing   Problem: Coping: Goal: Level of anxiety will decrease Outcome: Progressing   Problem: Elimination: Goal: Will not experience complications related to bowel motility Outcome: Progressing Goal: Will not experience complications related to urinary retention Outcome: Progressing   Problem: Pain Managment: Goal: General experience of comfort will improve and/or be controlled Outcome: Progressing   Problem: Safety: Goal: Ability to remain free from injury will improve Outcome: Progressing   Problem: Skin Integrity: Goal: Risk for impaired skin integrity will decrease Outcome: Progressing

## 2024-07-01 LAB — GLUCOSE, CAPILLARY
Glucose-Capillary: 155 mg/dL — ABNORMAL HIGH (ref 70–99)
Glucose-Capillary: 161 mg/dL — ABNORMAL HIGH (ref 70–99)
Glucose-Capillary: 175 mg/dL — ABNORMAL HIGH (ref 70–99)
Glucose-Capillary: 196 mg/dL — ABNORMAL HIGH (ref 70–99)
Glucose-Capillary: 214 mg/dL — ABNORMAL HIGH (ref 70–99)
Glucose-Capillary: 235 mg/dL — ABNORMAL HIGH (ref 70–99)

## 2024-07-01 LAB — BASIC METABOLIC PANEL WITH GFR
Anion gap: 14 (ref 5–15)
Anion gap: 12 (ref 5–15)
BUN: 33 mg/dL — ABNORMAL HIGH (ref 8–23)
BUN: 33 mg/dL — ABNORMAL HIGH (ref 8–23)
CO2: 22 mmol/L (ref 22–32)
CO2: 22 mmol/L (ref 22–32)
Calcium: 7.4 mg/dL — ABNORMAL LOW (ref 8.9–10.3)
Calcium: 7.4 mg/dL — ABNORMAL LOW (ref 8.9–10.3)
Chloride: 103 mmol/L (ref 98–111)
Chloride: 107 mmol/L (ref 98–111)
Creatinine, Ser: 2.8 mg/dL — ABNORMAL HIGH (ref 0.61–1.24)
Creatinine, Ser: 2.84 mg/dL — ABNORMAL HIGH (ref 0.61–1.24)
GFR, Estimated: 22 mL/min — ABNORMAL LOW (ref >60)
GFR, Estimated: 21 mL/min — ABNORMAL LOW (ref >60)
Glucose, Bld: 187 mg/dL — ABNORMAL HIGH (ref 70–99)
Glucose, Bld: 179 mg/dL — ABNORMAL HIGH (ref 70–99)
Potassium: 3.1 mmol/L — ABNORMAL LOW (ref 3.5–5.1)
Potassium: 3.2 mmol/L — ABNORMAL LOW (ref 3.5–5.1)
Sodium: 139 mmol/L (ref 135–145)
Sodium: 141 mmol/L (ref 135–145)

## 2024-07-01 LAB — MAGNESIUM: Magnesium: 2.1 mg/dL (ref 1.7–2.4)

## 2024-07-01 LAB — CULTURE, RESPIRATORY W GRAM STAIN

## 2024-07-01 LAB — VITAMIN B1: Vitamin B1 (Thiamine): 286.5 nmol/L — ABNORMAL HIGH (ref 66.5–200.0)

## 2024-07-01 LAB — PHOSPHORUS: Phosphorus: 3.1 mg/dL (ref 2.5–4.6)

## 2024-07-01 MED ORDER — POTASSIUM CHLORIDE 20 MEQ PO PACK
40.0000 meq | PACK | Freq: Two times a day (BID) | ORAL | Status: AC
  Administered 2024-07-01 (×2): 40 meq
  Filled 2024-07-01 (×2): qty 2

## 2024-07-01 MED ORDER — FUROSEMIDE 10 MG/ML IJ SOLN
80.0000 mg | Freq: Every day | INTRAMUSCULAR | Status: DC
  Administered 2024-07-01: 80 mg via INTRAVENOUS
  Filled 2024-07-01: qty 8

## 2024-07-01 MED ORDER — FUROSEMIDE 10 MG/ML IJ SOLN
60.0000 mg | Freq: Every day | INTRAMUSCULAR | Status: AC
Start: 2024-07-02 — End: 2024-07-04
  Administered 2024-07-02 – 2024-07-03 (×2): 60 mg via INTRAVENOUS
  Filled 2024-07-01 (×2): qty 6

## 2024-07-01 MED ORDER — MIDODRINE HCL 5 MG PO TABS
10.0000 mg | ORAL_TABLET | Freq: Three times a day (TID) | ORAL | Status: AC
  Administered 2024-07-01: 10 mg
  Filled 2024-07-01: qty 2

## 2024-07-01 MED ORDER — POTASSIUM CHLORIDE 20 MEQ PO PACK
40.0000 meq | PACK | Freq: Once | ORAL | Status: AC
  Administered 2024-07-01: 40 meq
  Filled 2024-07-01: qty 2

## 2024-07-01 NOTE — Plan of Care (Signed)
  Problem: Fluid Volume: Goal: Ability to maintain a balanced intake and output will improve Outcome: Progressing   Problem: Metabolic: Goal: Ability to maintain appropriate glucose levels will improve Outcome: Progressing   Problem: Nutritional: Goal: Maintenance of adequate nutrition will improve Outcome: Progressing Goal: Progress toward achieving an optimal weight will improve Outcome: Progressing   

## 2024-07-01 NOTE — Progress Notes (Signed)
 NAME:  Jake Bartlett, MRN:  [id number], DOB:  12-18-1940, LOS: 4 ADMISSION DATE:  06/27/2024, CONSULTATION DATE:  9/11 REFERRING MD:  EDP, CHIEF COMPLAINT:  AMS   History of Present Illness:  Pt encephalopathic, therefore HPI obtained from EMR.    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  Outpatient oncology follow up arranged.     Presented with altered mental status, reported hypoglycemic with EMS with GCS 7 treated but mental status did not improve.  Some concern for worsening LUE weakness, stroke team activated on arrival.  Intubated for airway protection.  Hypotensive post intubation, treated with 3L and started on peripheral levophed . Taken for [facility] and CTA head/ neck neg, CTA chest/ abd/ pelvis neg for aneurysm or dissection, noted to have moderate amount of free air in abd, thought post-surgical related and bibasilar mucous plugging and consolidation with small bilateral pleural effusions.  Labs noted for WBC 24.6, H/H 9.5/29, K 3.3, glucose 106, BUN/ sCr 26/ 2.6 (previously 24/ 2.35), low albumin / protein, ammonia 33, lactic 1.2.  Pending UA/ UC.  Blood cultures sent, cefepime, flagyl  and vancomycin  ordered.  PCCM called for admit.   Pertinent  Medical History  T2DM, High cholesterol, prior stroke   Significant Hospital Events: Including procedures, antibiotic start and stop dates in addition to other pertinent events   9/7 admitted with septic shock likely due to aspiration pneumonia on vasopressor support  9/8 central line placed for ongoing pressor support, tube feeds initiated 9/9 patient extubated 9/10, patient off pressors, unable to tolerate swallow, coretrack placed  Interim History / Subjective:  No acute ovn events Low K  on AM labs, repleted with KCL per tube  Objective    Blood pressure (!) 97/49, pulse 62, temperature 98.2 F (36.8 C), temperature source Axillary, resp. rate (!) 22, weight 85.2 kg, SpO2 96%.        Intake/Output Summary (Last 24 hours) at 07/01/2024 0838 Last data filed at 07/01/2024 0600 Gross per 24 hour  Intake 814.7 ml  Output 2035 ml  Net -1220.3 ml   Filed Weights   06/29/24 0445 06/30/24 0458 07/01/24 0352  Weight: 83 kg 86 kg 85.2 kg    Examination: General: Elderly, ill appearing, no distress HENT: PERRLA EOMI Lungs: Coarse lung sounds on the left, good air movement Cardiovascular: RRR, no m/r/g Abdomen: soft, nontender Extremities: Warm, well perfused, 2+ edema Neuro: A&O x 4, follows commands appropriately  Resolved problem list  Acute respiratory failure with hypoxia due to bilateral multifocal pneumonia Acute septic encephalopathy Septic Shock Assessment and Plan  Sepsis d/t bilateral multifocal pneumonia, likely aspiration MAPs appropriate with Midodrine  Continue Zosyn , total 10 days Supportive care as indicated Stable for transfer to the floor   Prior right thalamic ICH, with residual left deficit Repeat CT head is negative for acute findings Continue supportive care PT/SLP eval for mobility and swallow assessments Coretrack placed due to poor swallow   CKD stage IV Hypokalemia  Hypocalcemia  Decreased urine output Creatinine at baseline Avoid nephrotoxic agent Continue aggressive electrolyte replacement Good response to 80 mg IV lasix , will continue for 3 doses   Anemia of chronic disease Monitor H&H and transfuse if less than 7 H&H trending down slowly, 7.4 repeat CBC today   Well-controlled diabetes type 2  with recurrent hypoglycemia Patient hemoglobin A1c is 5.8 Required D50 boluses Fingersticks at goal <180, continue resistant SSI   HTN Hold antihypertensive meds, BP still soft despite appropriate MAP Add back as appropriate    Multiple pressure injuries, POA 1.  R heel Unstageable Pressure Injury 80% black eschar 20% pink  2.  L heel Deep Tissue Pressure Injury purple maroon discoloration  3.  Full thickness R posterior leg pink  4. Partial thickness mid back pink  5.  Deep Tissue Pressure Injury sacrum purple maroon discoloration  6.  Partial thickness skin loss B shins pink moist, some scabbed 7. Stage 3 Pressure Injury R ankle/lower anterior leg 50% pink 50% yellow (from old traction)  8.  Midline abdomen healing surgical incision  Wound care is following   Colon adenocarcinoma  Recent cecal mass s/p R hemicolectomy 9/2  Output oncology f/u as planned with Dr. Cloretta      Labs   CBC: Recent Labs  Lab 06/27/24 1453 06/27/24 1500 06/28/24 0247 06/28/24 0819 06/29/24 0240 06/30/24 0450 06/30/24 1310  WBC 24.6*  --  21.2*  --  16.2* 13.5* 11.3*  NEUTROABS 21.6*  --   --   --   --   --   --   HGB 9.5*   < > 10.2* 9.2* 8.4* 7.4* 7.8*  HCT 29.9*   < > 33.3* 27.0* 27.2* 23.4* 24.6*  MCV 98.7  --  100.3*  --  98.6 96.7 97.2  PLT 503*  --  459*  --  526* 460* 389   < > = values in this interval not displayed.    Basic Metabolic Panel: Recent Labs  Lab 06/27/24 1901 06/28/24 0247 06/28/24 0819 06/29/24 0240 06/30/24 0450 06/30/24 1310 07/01/24 0345  NA 139 137 146* 136  --  138 139  K 3.0* 3.4* 4.5 4.1 3.4* 3.8 3.1*  CL 112* 109  --  108  --  106 103  CO2 17* 14*  --  17*  --  23 22  GLUCOSE 59* 90  --  246*  --  111* 187*  BUN 27* 25*  --  29*  --  33* 33*  CREATININE 2.36* 2.31*  --  2.82*  --  2.68* 2.80*  CALCIUM  7.0* 7.2*  --  7.0*  --  7.4* 7.4*  MG 1.8  --   --  1.6* 2.0  --  2.1  PHOS  --   --   --  3.5 3.0  --  3.1   GFR: Estimated Creatinine Clearance: 20.9 mL/min (A) (by C-G formula based on SCr of 2.8 mg/dL (H)). Recent Labs  Lab 06/27/24 1501 06/28/24 0247 06/29/24 0240 06/30/24 0450 06/30/24 1310  WBC  --  21.2* 16.2* 13.5* 11.3*  LATICACIDVEN 1.2  --   --   --    --     Liver Function Tests: Recent Labs  Lab 06/27/24 1453 06/28/24 0247  AST 15 17  ALT <5 <5  ALKPHOS 115 112  BILITOT 0.4 0.6  PROT 4.6* 4.4*  ALBUMIN  <1.5* <1.5*   No results for input(s): LIPASE, AMYLASE in the last 168 hours. Recent Labs  Lab 06/27/24 1457  AMMONIA 33    ABG    Component Value Date/Time   PHART 7.587 (H) 06/28/2024 0819   PCO2ART 35.3 06/28/2024 0819   PO2ART 112 (H) 06/28/2024 0819   HCO3 33.5 (H) 06/28/2024 0819   TCO2 35 (H) 06/28/2024 0819   ACIDBASEDEF 7.0 (H) 06/27/2024 1549  O2SAT 99 06/28/2024 0819     Coagulation Profile: No results for input(s): INR, PROTIME in the last 168 hours.  Cardiac Enzymes: No results for input(s): CKTOTAL, CKMB, CKMBINDEX, TROPONINI in the last 168 hours.  HbA1C: Hgb A1c MFr Bld  Date/Time Value Ref Range Status  06/16/2024 07:26 PM 5.8 (H) 4.8 - 5.6 % Final    Comment:    (NOTE)         Prediabetes: 5.7 - 6.4         Diabetes: >6.4         Glycemic control for adults with diabetes: <7.0   04/12/2022 09:28 AM 7.0 (H) 4.8 - 5.6 % Final    Comment:    (NOTE) Pre diabetes:          5.7%-6.4%  Diabetes:              >6.4%  Glycemic control for   <7.0% adults with diabetes     CBG: Recent Labs  Lab 06/30/24 1538 06/30/24 1940 06/30/24 2320 07/01/24 0324 07/01/24 0748  GLUCAP 99 111* 165* 175* 155*    Review of Systems:   Denies pain, reports improved breathing.   Past Medical History:  He,  has a past medical history of Diabetes mellitus without complication (HCC), High cholesterol, and Stroke (HCC).   Surgical History:   Past Surgical History:  Procedure Laterality Date   BACK SURGERY     BONE BIOPSY  06/20/2024   Procedure: BIOPSY, GI;  Surgeon: Saintclair Jasper, MD;  Location: [facility];  Service: Gastroenterology;;   COLONOSCOPY N/A 06/20/2024   Procedure: COLONOSCOPY;  Surgeon: Saintclair Jasper, MD;  Location: [facility];  Service: Gastroenterology;   Laterality: N/A;   HIP SURGERY     LAPAROSCOPIC PARTIAL COLECTOMY N/A 06/22/2024   Procedure: LAPAROSCOPIC RIGHT COLECTOMY;  Surgeon: Teresa Lonni HERO, MD;  Location: [facility];  Service: General;  Laterality: N/A;   PENILE PROSTHESIS IMPLANT  08/14/2006   AMS Penile Prosthesis; Performed by MYRTIS Stalling MD     Social History:   reports that he has never smoked. He has never been exposed to tobacco smoke. He has never used smokeless tobacco. He reports that he does not drink alcohol and does not use drugs.   Family History:  His family history is not on file.   Allergies Allergies  Allergen Reactions   Ropinirole Anaphylaxis and Swelling    Angioedema   Neurontin [Gabapentin] Other (See Comments)    Drowsiness     Home Medications  Prior to Admission medications   Medication Sig Start Date End Date Taking? Authorizing Provider  acetaminophen  (TYLENOL ) 500 MG tablet Take 1,000 mg by mouth every 8 (eight) hours as needed for mild pain (pain score 1-3).   Yes [provider]  B Complex Vitamins (VITAMIN B COMPLEX) TABS Take 1 tablet by mouth daily.   Yes [provider]  carbidopa -levodopa  (SINEMET  IR) 25-250 MG tablet Take 1 tablet by mouth in the morning, at noon, and at bedtime. 01/09/22  Yes [provider]  carvedilol (COREG) 3.125 MG tablet Take 3.125 mg by mouth in the morning.   Yes [provider]  Cholecalciferol  (VITAMIN D3) 10 MCG (400 UNIT) tablet Take 400 Units by mouth daily.   Yes [provider]  escitalopram  (LEXAPRO ) 10 MG tablet Take 10 mg by mouth daily.   Yes [provider]  folic acid  (FOLVITE ) 1 MG tablet Take 1 mg by mouth daily. 11/23/21  Yes [provider]  Multiple Vitamin (MULTIVITAMIN) tablet Take 1 tablet by mouth daily.   Yes [provider]  NOVOLIN 70/30 KWIKPEN (70-30) 100 UNIT/ML KwikPen Inject 10-20 Units into the skin See admin instructions. Inject 20 units into the skin every  morning and inject 10 units at bedtime.   Yes [provider]  oxyCODONE  (OXY IR/ROXICODONE ) 5 MG immediate release tablet Take 1 tablet (5 mg total) by mouth every 4 (four) hours as needed for moderate pain (pain score 4-6). 06/25/24  Yes Adhikari, Ivonne, MD  pramipexole  (MIRAPEX ) 0.25 MG tablet Take 0.25 mg by mouth at bedtime.   Yes [provider]  rosuvastatin  (CRESTOR ) 10 MG tablet Take 10 mg by mouth at bedtime. 01/16/22  Yes [provider]  sodium bicarbonate  650 MG tablet Take 1 tablet (650 mg total) by mouth 3 (three) times daily. 06/25/24  Yes Jillian Ivonne, MD  tamsulosin  (FLOMAX ) 0.4 MG CAPS capsule Take 1 capsule (0.4 mg total) by mouth daily. 02/24/22  Yes Towana Ozell BROCKS, MD  feeding supplement (ENSURE PLUS HIGH PROTEIN) LIQD Take 237 mLs by mouth 2 (two) times daily between meals. Patient not taking: Reported on 06/28/2024 06/25/24   Adhikari, Amrit, MD  Nystatin (GERHARDT'S BUTT CREAM) CREA Apply 1 Application topically 3 (three) times daily. Patient not taking: Reported on 06/28/2024 06/25/24   Jillian Ivonne, MD     Critical care time:

## 2024-07-01 NOTE — Progress Notes (Signed)
 eLink Physician-Brief Progress Note Patient Name: [name] DOB: 1941/03/01 MRN: [id number]   Date of Service  07/01/2024  HPI/Events of Note  K+ 3.1, Cr 2.80  eICU Interventions  KCL 40 meq x 1 via Cortrack ordered.        Valeriano Bain U Kimberlie Csaszar 07/01/2024, 6:39 AM

## 2024-07-01 NOTE — Progress Notes (Signed)
 OT Cancellation Note  Patient Details Name: Jake Bartlett MRN: [id number] DOB: 05-01-1941   Cancelled Treatment:    Reason Eval/Treat Not Completed: Other (comment) (Per RN, pt being moved to 5w. OT evaluation to f/u when he settled in his new room.)  [name] 07/01/2024, 2:13 PM

## 2024-07-01 NOTE — Plan of Care (Signed)
   Problem: Education: Goal: Ability to describe self-care measures that may prevent or decrease complications (Diabetes Survival Skills Education) will improve Outcome: Progressing Goal: Individualized Educational Video(s) Outcome: Progressing   Problem: Coping: Goal: Ability to adjust to condition or change in health will improve Outcome: Progressing   Problem: Fluid Volume: Goal: Ability to maintain a balanced intake and output will improve Outcome: Progressing   Problem: Health Behavior/Discharge Planning: Goal: Ability to identify and utilize available resources and services will improve Outcome: Progressing Goal: Ability to manage health-related needs will improve Outcome: Progressing   Problem: Metabolic: Goal: Ability to maintain appropriate glucose levels will improve Outcome: Progressing   Problem: Nutritional: Goal: Maintenance of adequate nutrition will improve Outcome: Progressing Goal: Progress toward achieving an optimal weight will improve Outcome: Progressing   Problem: Skin Integrity: Goal: Risk for impaired skin integrity will decrease Outcome: Progressing   Problem: Tissue Perfusion: Goal: Adequacy of tissue perfusion will improve Outcome: Progressing   Problem: Activity: Goal: Ability to tolerate increased activity will improve Outcome: Progressing   Problem: Respiratory: Goal: Ability to maintain a clear airway and adequate ventilation will improve Outcome: Progressing   Problem: Role Relationship: Goal: Method of communication will improve Outcome: Progressing   Problem: Education: Goal: Knowledge of General Education information will improve Description: Including pain rating scale, medication(s)/side effects and non-pharmacologic comfort measures Outcome: Progressing   Problem: Health Behavior/Discharge Planning: Goal: Ability to manage health-related needs will improve Outcome: Progressing   Problem: Clinical Measurements: Goal:  Ability to maintain clinical measurements within normal limits will improve Outcome: Progressing Goal: Will remain free from infection Outcome: Progressing Goal: Diagnostic test results will improve Outcome: Progressing Goal: Respiratory complications will improve Outcome: Progressing Goal: Cardiovascular complication will be avoided Outcome: Progressing   Problem: Activity: Goal: Risk for activity intolerance will decrease Outcome: Progressing   Problem: Nutrition: Goal: Adequate nutrition will be maintained Outcome: Progressing   Problem: Coping: Goal: Level of anxiety will decrease Outcome: Progressing   Problem: Elimination: Goal: Will not experience complications related to bowel motility Outcome: Progressing Goal: Will not experience complications related to urinary retention Outcome: Progressing   Problem: Pain Managment: Goal: General experience of comfort will improve and/or be controlled Outcome: Progressing   Problem: Safety: Goal: Ability to remain free from injury will improve Outcome: Progressing   Problem: Skin Integrity: Goal: Risk for impaired skin integrity will decrease Outcome: Progressing

## 2024-07-01 NOTE — Progress Notes (Addendum)
 Speech Language Pathology Treatment: Dysphagia  Patient Details Name: Jake Bartlett MRN: [id number] DOB: [birth year] Today's Date: 07/01/2024 Time: 8889-8876 SLP Time Calculation (min) (ACUTE ONLY): 13 min  Assessment / Plan / Recommendation Clinical Impression  Patient presented with lethargy/drowsiness. Wife in the room with the patient. Patient demonstrated improved verbal responses compared to yesterday. Thin liquid provided by spoon. Intermitten coughing at first but progressed to overt coughing following the spoons of water . Patient's lethargy and weakness is primary hindrance to PO readiness. Recommended to remain NPO at this time. SLP will continue to follow.   HPI HPI: Patient is an 83 y. o. 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 Recent hospitalization 8/27-9/5 discharged to SNF for UTI also found to have cecal mass s/p laparoscopic right hemicolectomy 9/2 with pathology consistent with adenocarcinoma. Presented with altered mental status on 9/7, reported hypoglycemic with EMS with GCS 7 treated but mental status did not improve. Intubated for airway protection on 9/7. Patient was extubated on 9/9. CTH and CTA head/neck neg, CTA chest/abd/pelvis neg for aneurysm or dissection. Patient is currently NPO. Bedside swallow evaluation ordered to assess patient's swallow function.      SLP Plan  Continue with current plan of care          Recommendations  Diet recommendations: NPO Liquids provided via: Teaspoon Compensations: Minimize environmental distractions;Slow rate;Small sips/bites                  Oral care BID   Frequent or constant Supervision/Assistance Dysphagia, unspecified (R13.10)     Continue with current plan of care    Damien Hy  Graduate SLP Clinican

## 2024-07-02 DIAGNOSIS — R579 Shock, unspecified: Secondary | ICD-10-CM | POA: Diagnosis not present

## 2024-07-02 DIAGNOSIS — J9601 Acute respiratory failure with hypoxia: Secondary | ICD-10-CM | POA: Diagnosis not present

## 2024-07-02 DIAGNOSIS — E43 Unspecified severe protein-calorie malnutrition: Secondary | ICD-10-CM | POA: Diagnosis not present

## 2024-07-02 LAB — BASIC METABOLIC PANEL WITH GFR
Anion gap: 13 (ref 5–15)
Anion gap: 16 — ABNORMAL HIGH (ref 5–15)
BUN: 37 mg/dL — ABNORMAL HIGH (ref 8–23)
BUN: 40 mg/dL — ABNORMAL HIGH (ref 8–23)
CO2: 24 mmol/L (ref 22–32)
CO2: 23 mmol/L (ref 22–32)
Calcium: 7.3 mg/dL — ABNORMAL LOW (ref 8.9–10.3)
Calcium: 7.6 mg/dL — ABNORMAL LOW (ref 8.9–10.3)
Chloride: 106 mmol/L (ref 98–111)
Chloride: 106 mmol/L (ref 98–111)
Creatinine, Ser: 2.9 mg/dL — ABNORMAL HIGH (ref 0.61–1.24)
Creatinine, Ser: 2.86 mg/dL — ABNORMAL HIGH (ref 0.61–1.24)
GFR, Estimated: 21 mL/min — ABNORMAL LOW (ref >60)
GFR, Estimated: 21 mL/min — ABNORMAL LOW (ref >60)
Glucose, Bld: 217 mg/dL — ABNORMAL HIGH (ref 70–99)
Glucose, Bld: 208 mg/dL — ABNORMAL HIGH (ref 70–99)
Potassium: 3.3 mmol/L — ABNORMAL LOW (ref 3.5–5.1)
Potassium: 3.6 mmol/L (ref 3.5–5.1)
Sodium: 143 mmol/L (ref 135–145)
Sodium: 145 mmol/L (ref 135–145)

## 2024-07-02 LAB — GLUCOSE, CAPILLARY
Glucose-Capillary: 223 mg/dL — ABNORMAL HIGH (ref 70–99)
Glucose-Capillary: 237 mg/dL — ABNORMAL HIGH (ref 70–99)
Glucose-Capillary: 246 mg/dL — ABNORMAL HIGH (ref 70–99)
Glucose-Capillary: 216 mg/dL — ABNORMAL HIGH (ref 70–99)
Glucose-Capillary: 225 mg/dL — ABNORMAL HIGH (ref 70–99)
Glucose-Capillary: 130 mg/dL — ABNORMAL HIGH (ref 70–99)

## 2024-07-02 LAB — CULTURE, BLOOD (ROUTINE X 2)
Culture: NO GROWTH
Culture: NO GROWTH

## 2024-07-02 LAB — MAGNESIUM: Magnesium: 1.9 mg/dL (ref 1.7–2.4)

## 2024-07-02 MED ORDER — POTASSIUM CHLORIDE 20 MEQ PO PACK
40.0000 meq | PACK | ORAL | Status: AC
  Administered 2024-07-02 (×2): 40 meq
  Filled 2024-07-02 (×2): qty 2

## 2024-07-02 MED ORDER — METOPROLOL TARTRATE 12.5 MG HALF TABLET
12.5000 mg | ORAL_TABLET | Freq: Two times a day (BID) | ORAL | Status: DC
  Administered 2024-07-03 (×2): 12.5 mg via ORAL
  Filled 2024-07-02 (×3): qty 1

## 2024-07-02 NOTE — Plan of Care (Signed)
  Problem: Education: Goal: Ability to describe self-care measures that may prevent or decrease complications (Diabetes Survival Skills Education) will improve Outcome: Progressing   Problem: Coping: Goal: Ability to adjust to condition or change in health will improve Outcome: Progressing   Problem: Fluid Volume: Goal: Ability to maintain a balanced intake and output will improve Outcome: Progressing   Problem: Health Behavior/Discharge Planning: Goal: Ability to identify and utilize available resources and services will improve Outcome: Progressing   Problem: Nutritional: Goal: Maintenance of adequate nutrition will improve Outcome: Progressing   Problem: Skin Integrity: Goal: Risk for impaired skin integrity will decrease Outcome: Progressing   Problem: Tissue Perfusion: Goal: Adequacy of tissue perfusion will improve Outcome: Progressing   Problem: Nutrition: Goal: Adequate nutrition will be maintained Outcome: Progressing   Problem: Pain Managment: Goal: General experience of comfort will improve and/or be controlled Outcome: Progressing   Problem: Safety: Goal: Ability to remain free from injury will improve Outcome: Progressing

## 2024-07-02 NOTE — Evaluation (Signed)
 Occupational Therapy Evaluation Patient Details Name: Jake Bartlett MRN: [id number] DOB: 1941-07-15 Today's Date: 07/02/2024   History of Present Illness   83 yo M adm 06/27/24 from SNF unresponsive with hypoglycemia and sepsis. MRI (-) VDRF 9/7-9/9. PMHx: [facility] admission 8/27-06/25/24 with UTI and cecal mass. 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 Approximately 3 weeks ago, pt was walking about 40 feet with a RW and needing some assistance with all ADLs. Pt now presents with decreased activity tolerance, generalized B UE weakness, decreased B UE coordination, decreased cognition, decreased balance, impaired cardiopulmonary status, and decreased safety and independence with functional tasks. Pt currently requiring largely Mod to Total assist +2 for ADLs and Total assist +2 for bed mobility. Pt made good attempt to participate in session but limited by deficits outlined below. Pt will benefit from acute OT services to address deficits and increase safety and independence with functional tasks. Post acute discharge, pt will benefit from intensive inpatient skilled rehab services < 3 hours per day to maximize rehab potential.     If plan is discharge home, recommend the following:   Two people to help with walking and/or transfers;Two people to help with bathing/dressing/bathroom;Assistance with cooking/housework;Assistance with feeding;Direct supervision/assist for medications management;Direct supervision/assist for financial management;Assist for transportation;Help with stairs or ramp for entrance;Supervision due to cognitive status     Functional Status Assessment   Patient has had a recent decline in their functional status and  demonstrates the ability to make significant improvements in function in a reasonable and predictable amount of time.     Equipment Recommendations   Other (comment) (defer to next level of care)     Recommendations for Other Services         Precautions/Restrictions   Precautions Precautions: Fall Recall of Precautions/Restrictions: Intact Restrictions Weight Bearing Restrictions Per Provider Order: No     Mobility Bed Mobility Overal bed mobility: Needs Assistance Bed Mobility: Supine to Sit, Sit to Supine     Supine to sit: Total assist, +2 for physical assistance, HOB elevated Sit to supine: Total assist, +2 for physical assistance, HOB elevated   General bed mobility comments: provided total Ax2 to get to EoB, pt able to follow command to reach with R UE towards bedrail, pt BP: 95/54 in seated, pt with closed eyes and limited response to question, returned to supine with totalAx2    Transfers                   General transfer comment: deferred due to low BP      Balance Overall balance assessment: Needs assistance Sitting-balance support: Bilateral upper extremity supported, Feet unsupported Sitting balance-Leahy Scale: Zero Sitting balance - Comments: requires Mod to Total assist for sitting balance Postural control: Posterior lean                                 ADL either performed or assessed with clinical judgement   ADL Overall ADL's : Needs assistance/impaired Eating/Feeding: NPO   Grooming: Wash/dry face;Moderate assistance;Cueing for safety;Cueing for sequencing;Sitting   Upper Body Bathing: Maximal assistance;Cueing for compensatory techniques;Cueing for sequencing;Sitting   Lower Body Bathing: Total assistance;+2  for physical assistance;+2 for safety/equipment;Bed level   Upper Body Dressing : Maximal assistance;Cueing for safety;Cueing for sequencing;Cueing for compensatory techniques;Sitting   Lower Body Dressing:  Total assistance;+2 for physical assistance;+2 for safety/equipment;Bed level     Toilet Transfer Details (indicate cue type and reason): unable at this time Toileting- Architect and Hygiene: Total assistance;+2 for physical assistance;+2 for safety/equipment;Bed level               Vision Baseline Vision/History: 1 Wears glasses Ability to See in Adequate Light: 0 Adequate Patient Visual Report: No change from baseline Additional Comments: [facility] for tasks assessed; not formally screened or evaluated     Perception         Praxis         Pertinent Vitals/Pain Pain Assessment Pain Assessment: Faces Faces Pain Scale: Hurts a little bit Pain Location: neck and shoulders Pain Descriptors / Indicators: Grimacing, Aching, Sore Pain Intervention(s): Limited activity within patient's tolerance, Monitored during session, Repositioned     Extremity/Trunk Assessment Upper Extremity Assessment Upper Extremity Assessment: Right hand dominant;Generalized weakness;RUE deficits/detail;LUE deficits/detail RUE Deficits / Details: generalized weakness; dereased coordination; AROM largely WFL; decreased proprioception; mild tremors noted; requires increased time for motor planning RUE Sensation: decreased proprioception RUE Coordination: decreased fine motor;decreased gross motor LUE Deficits / Details: generalized weakness; dereased coordination; AROM largely WFL; decreased proprioception; mild tremors noted; requires increased time for motor planning LUE Sensation: decreased proprioception LUE Coordination: decreased fine motor;decreased gross motor   Lower Extremity Assessment Lower Extremity Assessment: Defer to PT evaluation   Cervical / Trunk Assessment Cervical / Trunk Assessment: Kyphotic Cervical / Trunk Exceptions: increased forward head posture, making sitting in bed uncomfortable   Communication Communication Communication: Impaired Factors Affecting  Communication: Hearing impaired   Cognition Arousal: Alert Behavior During Therapy: Flat affect Cognition: Cognition impaired   Orientation impairments: Place, Time, Situation Awareness: Intellectual awareness intact, Online awareness impaired Memory impairment (select all impairments): Short-term memory, Working Civil Service fast streamer, Conservation officer, historic buildings Attention impairment (select first level of impairment): Selective attention Executive functioning impairment (select all impairments): Organization, Reasoning, Problem solving, Sequencing OT - Cognition Comments: Pt alert and oriented to self and family members. Pt intermittently able to answer yes/no and simple questions with 1-word answers. Pt able to sequence familiar 1 to 2-step tasks (i.e. touch my hand, wipe your chin)                 Following commands: Impaired Following commands impaired: Follows one step commands with increased time, Follows multi-step commands inconsistently, Follows multi-step commands with increased time, Only follows one step commands consistently     Cueing  General Comments   Cueing Techniques: Verbal cues;Gestural cues;Tactile cues;Visual cues  BP: 95/54 seated, BP: 90/55 seated after 3 minutes, BP: 104/51 return to supine.; pt reporting mild dizziness in sitting; pt's wife, son, and DIL present and supportive throughout session; RN present at end of session   Exercises     Shoulder Instructions      Home Living Family/patient expects to be discharged to:: Skilled nursing facility Living Arrangements: Spouse/significant other Available Help at Discharge: Family Type of Home: House Home Access: Level entry     Home Layout: One level     Bathroom Shower/Tub: Walk-in shower         Home Equipment: Agricultural consultant (2 wheels);BSC/3in1;Shower seat;Wheelchair - manual;Cane - single point   Additional Comments: Pt lives with wife, son lives down the street      Prior  Functioning/Environment  Prior Level of Function : Needs assist             Mobility Comments: 3 weeks ago was walking 40 feet with RW, hospitalized since then and was not able to work with therapy on return to SNF ADLs Comments: help with all ADLs since June 2025, prior to that, pt was largely Mod I for ADLs and has had a sharp decline since that time per his wife's report    OT Problem List: Decreased strength;Decreased activity tolerance;Impaired balance (sitting and/or standing);Decreased coordination;Decreased cognition;Cardiopulmonary status limiting activity   OT Treatment/Interventions: Self-care/ADL training;Therapeutic exercise;DME and/or AE instruction;Therapeutic activities;Cognitive remediation/compensation;Patient/family education;Balance training      OT Goals(Current goals can be found in the care plan section)   Acute Rehab OT Goals Patient Stated Goal: pt unable to state OT Goal Formulation: With family Time For Goal Achievement: 07/16/24 Potential to Achieve Goals: Fair ADL Goals Pt Will Perform Grooming: with contact guard assist;sitting (sitting with Fair balance for 3 or more minutes) Pt Will Perform Upper Body Bathing: with mod assist;sitting Pt Will Perform Upper Body Dressing: with min assist;sitting Pt Will Transfer to Toilet: with max assist;with +2 assist;bedside commode (with least restrictive AD) Additional ADL Goal #1: Patient will demonstrate ability to appropriately sequence a familiar 3-step task without cues in 3/4 opportunities to increase safety and independence with functional tasks.   OT Frequency:  Min 1X/week    Co-evaluation PT/OT/SLP Co-Evaluation/Treatment: Yes Reason for Co-Treatment: Complexity of the patient's impairments (multi-system involvement);For patient/therapist safety PT goals addressed during session: Mobility/safety with mobility;Proper use of DME OT goals addressed during session: ADL's and self-care;Strengthening/ROM       AM-PAC OT 6 Clicks Daily Activity     Outcome Measure Help from another person eating meals?: Total (cotrak) Help from another person taking care of personal grooming?: A Lot Help from another person toileting, which includes using toliet, bedpan, or urinal?: Total Help from another person bathing (including washing, rinsing, drying)?: A Lot Help from another person to put on and taking off regular upper body clothing?: A Lot Help from another person to put on and taking off regular lower body clothing?: Total 6 Click Score: 9   End of Session Nurse Communication: Mobility status;Other (comment) (Pt with low BP that decreased in sitting but was not orthostatic; pt's lip dry, cracked, and bleeding; OT washed lip and applied lip balm per RN direction)  Activity Tolerance: Patient tolerated treatment well;Treatment limited secondary to medical complications (Comment) (pt limited by low BP) Patient left: in bed;with call bell/phone within reach;with bed alarm set;with family/visitor present;with nursing/sitter in room  OT Visit Diagnosis: Other abnormalities of gait and mobility (R26.89);Muscle weakness (generalized) (M62.81);History of falling (Z91.81)                Time: 8571-8542 OT Time Calculation (min): 29 min Charges:  OT General Charges $OT Visit: 1 Visit OT Evaluation $OT Eval Moderate Complexity: 1 Mod  Jake Bartlett HERO., OTR/L, MA Acute Rehab [phone]   Jake FORBES Horns 07/02/2024, 5:48 PM

## 2024-07-02 NOTE — Progress Notes (Signed)
 Physical Therapy Treatment Patient Details Name: Jake Bartlett MRN: [id number] DOB: 11-07-1940 Today's Date: 07/02/2024   History of Present Illness 83 yo M adm 06/27/24 from SNF unresponsive with hypoglycemia and sepsis. MRI (-) VDRF 9/7-9/9. PMHx: [facility] admission 8/27-06/25/24 with UTI and cecal mass. 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 Co-treat with OT. Pt today was able to sit EOB with +2 total A however reported lightheadedness and found to have soft BP. BP: 95/54 seated, BP: 90/55 seated after 3 minutes, BP: 104/51 return to supine. No change in DC/DME recs at this time. PT will continue to follow.    If plan is discharge home, recommend the following: Assistance with cooking/housework;Help with stairs or ramp for entrance;Assist for transportation;Two people to help with walking and/or transfers;Two people to help with bathing/dressing/bathroom;Assistance with feeding;Direct supervision/assist for medications management;Direct supervision/assist for financial management   Can travel by private vehicle     No  Equipment Recommendations  None recommended by PT    Recommendations for Other Services       Precautions / Restrictions Precautions Precautions: Fall Recall of Precautions/Restrictions: Intact Restrictions Weight Bearing Restrictions Per Provider Order: No     Mobility  Bed Mobility Overal bed mobility: Needs Assistance Bed Mobility: Supine to Sit, Sit to Supine     Supine to sit: Total assist, +2 for physical assistance, HOB elevated Sit to supine: Total assist, +2 for physical assistance, HOB elevated   General bed mobility comments: provided total Ax2 to get to EoB, pt able to follow command to reach with R UE towards bedrail, pt BP: 95/54 in seated, pt with closed eyes and limited response to question, returned to  supine with totalAx2    Transfers                   General transfer comment: deferred due to low BP    Ambulation/Gait                   Stairs             Wheelchair Mobility     Tilt Bed    Modified Rankin (Stroke Patients Only)       Balance Overall balance assessment: Needs assistance Sitting-balance support: Bilateral upper extremity supported, Feet unsupported Sitting balance-Leahy Scale: Zero   Postural control: Posterior lean                                  Communication Communication Communication: Impaired Factors Affecting Communication: Hearing impaired;Reduced clarity of speech  Cognition Arousal: Alert Behavior During Therapy: Flat affect   PT - Cognitive impairments: Difficult to assess Difficult to assess due to: Impaired communication                     PT - Cognition Comments: able to state name and date of birth, responds intermittently to yes/no questions with head nod Following commands: Intact      Cueing Cueing Techniques: Verbal cues, Gestural cues, Tactile cues, Visual cues  Exercises General Exercises - Lower Extremity Ankle Circles/Pumps: AROM, Both, 5 reps, Seated    General Comments General comments (skin integrity, edema, etc.): BP: 95/54 seated, BP: 90/55 seated after 3 minutes, BP: 104/51 return to supine.  Pertinent Vitals/Pain Pain Assessment Pain Assessment: Faces Faces Pain Scale: Hurts a little bit Pain Location: neck and shoulders Pain Descriptors / Indicators: Grimacing, Aching, Sore Pain Intervention(s): Monitored during session, Limited activity within patient's tolerance, Repositioned    Home Living                          Prior Function            PT Goals (current goals can now be found in the care plan section) Progress towards PT goals: Not progressing toward goals - comment    Frequency    Min 1X/week      PT Plan       Co-evaluation PT/OT/SLP Co-Evaluation/Treatment: Yes Reason for Co-Treatment: Complexity of the patient's impairments (multi-system involvement);For patient/therapist safety PT goals addressed during session: Mobility/safety with mobility;Proper use of DME OT goals addressed during session: ADL's and self-care;Strengthening/ROM      AM-PAC PT 6 Clicks Mobility   Outcome Measure  Help needed turning from your back to your side while in a flat bed without using bedrails?: Total Help needed moving from lying on your back to sitting on the side of a flat bed without using bedrails?: Total Help needed moving to and from a bed to a chair (including a wheelchair)?: Total Help needed standing up from a chair using your arms (e.g., wheelchair or bedside chair)?: Total Help needed to walk in hospital room?: Total Help needed climbing 3-5 steps with a railing? : Total 6 Click Score: 6    End of Session Equipment Utilized During Treatment: Gait belt Activity Tolerance: Treatment limited secondary to medical complications (Comment) Patient left: in bed;with call bell/phone within reach;with bed alarm set;with family/visitor present Nurse Communication: Mobility status;Other (comment) PT Visit Diagnosis: Muscle weakness (generalized) (M62.81);Difficulty in walking, not elsewhere classified (R26.2);Unsteadiness on feet (R26.81)     Time: 8571-8542 PT Time Calculation (min) (ACUTE ONLY): 29 min  Charges:    $Therapeutic Activity: 8-22 mins PT General Charges $$ ACUTE PT VISIT: 1 Visit                     Sueellen NOVAK, PT, DPT Acute Rehab Services [phone]    Keaton Beichner 07/02/2024, 4:23 PM

## 2024-07-02 NOTE — Progress Notes (Signed)
 PROGRESS NOTE        PATIENT DETAILS Name: Jake Bartlett Age: 83 y.o. Sex: male Date of Birth: 11/22/1940 Admit Date: 06/27/2024 Admitting Physician Sammi Fredericks, MD ERE:Bajwzs, Slater, MD  Brief Summary: Patient is a 83 y.o.  male with history of CKD 4, DM-2, CVA, Parkinson's disease-who was recently hospitalized from 8/27-9/5 for UTI-he was ultimately found to have cecal mass-underwent right hemicolectomy (pathology confirmed adenocarcinoma)-brought to the hospital on 9/7 for altered mental status-he was hypoglycemic with EMS-after correction of hypoglycemia-his mental status did not improve-he was subsequently intubated and admitted to the ICU service.  He was ultimately found to have septic shock in the setting of multifocal pneumonia-he was stabilized/extubated and then transferred to [facility].  See below for further details.  Significant events: 9/07>> admitted with septic shock likely due to aspiration pneumonia on vasopressor support  9/08>> central line placed for ongoing pressor support, tube feeds initiated 9/09>>extubated 9/10>> patient off pressors, unable to tolerate swallow, coretrack placed 9/12>> transferred to [facility]  Significant studies: 9/07>> chest x-ray: Dense left retrocardiac opacity-atelectasis versus aspiration pneumonia. 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 9/08>> MRI brain: No acute intracranial abnormality.  Significant microbiology data: 9/07>> blood culture: No growth 9/08>> tracheal aspirate: Moderate Candida albicans-likely colonization. 9/09>> urine culture: No growth  Procedures: See above  Consults: PCCM Neurology  Subjective: Lying comfortably in bed-denies any chest pain or shortness of breath.  Objective: Vitals: Blood pressure (!) 109/46, pulse 64,  temperature 98 F (36.7 C), temperature source Oral, resp. rate (!) 22, weight 109 kg, SpO2 90%.   Exam: Gen Exam: Slightly confused but mostly awake/alert-easily redirectable. HEENT:atraumatic, normocephalic Chest: B/L clear to auscultation anteriorly CVS:S1S2 regular Abdomen:soft non tender, non distended Extremities:no edema Neurology: Non focal-but with generalized weakness. Skin: no rash  Pertinent Labs/Radiology:    Latest Ref Rng & Units 06/30/2024    1:10 PM 06/30/2024    4:50 AM 06/29/2024    2:40 AM  CBC  WBC 4.0 - 10.5 K/uL 11.3  13.5  16.2   Hemoglobin 13.0 - 17.0 g/dL 7.8  7.4  8.4   Hematocrit 39.0 - 52.0 % 24.6  23.4  27.2   Platelets 150 - 400 K/uL 389  460  526     Lab Results  Component Value Date   NA 143 07/02/2024   K 3.3 (L) 07/02/2024   CL 106 07/02/2024   CO2 24 07/02/2024      Assessment/Plan: Acute hypoxic respiratory failure and septic shock secondary to aspiration pneumonia Sepsis physiology has resolved Stable on just 2 L of oxygen On Zosyn -per prior PCCM note-10 days planned. Incentive spirometry/flutter valve/mobilize and attempt to titrate down oxygen as tolerated  Acute metabolic encephalopathy Secondary to sepsis physiology/hypoxia Overall improved but now has ICU/hospital delirium Delirium precautions  CKD 4 Close to baseline  Normocytic anemia Secondary to critical illness superimposed on anemia of CKD No evidence of blood loss Monitor CBC and transfuse if significant drop.  DM-2 (A1c 5.8 on 8/27) Apparently hypoglycemic on initial presentation CBGs stable for the past 24 hours Maintain on SSI.  Recent Labs    07/01/24 2345 07/02/24 0354 07/02/24 0737  GLUCAP 161* 223* 237*    HTN BP soft but stable No longer on  midodrine   Oropharyngeal dysphagia Likely secondary to deconditioning/weakness/critical illness N.p.o.-NG tube in place SLP following-await further recommendations.  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  Nutrition Status: Nutrition Problem: Severe Malnutrition Etiology: acute illness Signs/Symptoms: percent weight loss, moderate muscle depletion Percent weight loss: 12.4 % Interventions: MVI, Prostat, Tube feeding  Class 2 Obesity: Estimated body mass index is 36.54 kg/m as calculated from the following:   Height as of 06/22/24: 5' 8 (1.727 m).   Weight as of this encounter: 109 kg.   Wound care/pressure ulcer Note following wounds present prior to hospitalization Agree with assessment and plan per wound care RN. Wound type:  1.  R heel Unstageable Pressure Injury 80% black eschar 20% pink  2.  L heel Deep Tissue Pressure Injury purple maroon discoloration  3.  Full thickness R posterior leg pink  4. Partial thickness mid back pink  5.  Deep Tissue Pressure Injury sacrum purple maroon discoloration  6.  Partial thickness skin loss B shins pink moist, some scabbed 7. Stage 3 Pressure Injury R ankle/lower anterior leg 50% pink 50% yellow (from old traction)  8.  Midline abdomen healing surgical incision   Pressure Injury POA: Yes B heels and sacrum/ankle  Dressing procedure/placement/frequency:  Cleanse R heel with Vashe wound cleanser Soila [phone]) do not rinse and allow to air dry. Apply Medihoney to wound bed daily, cover with dry gauze and secure with Kerlix roll gauze.  Cleanse L heel with soap and water , dry and apply Xeroform gauze Soila [phone]) to purple discoloration daily.  Secure with silicone  foam. Place B feet in Prevalon boots to offload pressure.  Cleanse sacrum/buttocks with soap and water , dry and apply Xeroform gauze Soila [phone]) to purple discoloration daily. Secure with silicone foam.  Cleanse R posterior leg, mid back and B shin wounds with Vashe, cover with Xeroform gauze and secure with silicone foam.  Cleanse R ankle/lower leg wound with Vashe, using a Q tip applicator apply silver hydrofiber Soila [phone] Aquacel AG) to wound bed daily and secure with silicone foam.      Code status:   Code Status: Limited: Do not attempt resuscitation (DNR) -DNR-LIMITED -Do Not Intubate/DNI    DVT Prophylaxis: heparin  injection 5,000 Units Start: 06/28/24 0900 SCDs Start: 06/27/24 1630   Family Communication: Spouse-[name] Dam-[phone] updated 9/12   Disposition Plan: Status is: Inpatient Remains inpatient appropriate because: Severity of illness   Planned Discharge Destination:Skilled nursing facility   Diet: Diet Order             Diet NPO time specified  Diet effective now                     Antimicrobial agents: Anti-infectives (From admission, onward)    Start     Dose/Rate Route Frequency Ordered Stop   06/29/24 1700  vancomycin  (VANCOREADY) IVPB 1250 mg/250 mL  Status:  Discontinued        1,250 mg 166.7 mL/hr over 90 Minutes Intravenous Every 48 hours 06/27/24 1751 06/28/24 0801   06/27/24 1845  piperacillin -tazobactam (ZOSYN ) IVPB 3.375  g        3.375 g 12.5 mL/hr over 240 Minutes Intravenous Every 8 hours 06/27/24 1749 07/03/24 2359   06/27/24 1600  vancomycin  (VANCOREADY) IVPB 1500 mg/300 mL        1,500 mg 150 mL/hr over 120 Minutes Intravenous  Once 06/27/24 1555 06/27/24 2025   06/27/24 1545  ceFEPIme (MAXIPIME) 2 g in sodium chloride  0.9 % 100 mL IVPB  Status:  Discontinued        2 g 200 mL/hr over 30 Minutes Intravenous  Once 06/27/24 1542 06/27/24 1749   06/27/24 1545  metroNIDAZOLE  (FLAGYL ) IVPB 500 mg  Status:  Discontinued         500 mg 100 mL/hr over 60 Minutes Intravenous  Once 06/27/24 1542 06/27/24 1749   06/27/24 1545  vancomycin  (VANCOCIN ) IVPB 1000 mg/200 mL premix  Status:  Discontinued        1,000 mg 200 mL/hr over 60 Minutes Intravenous  Once 06/27/24 1542 06/27/24 1555        MEDICATIONS: Scheduled Meds:  carbidopa -levodopa   1 tablet Per Tube Q8H   Chlorhexidine  Gluconate Cloth  6 each Topical Daily   escitalopram   10 mg Per Tube Daily   feeding supplement (PROSource TF20)  60 mL Per Tube Daily   furosemide   60 mg Intravenous Daily   heparin  injection (subcutaneous)  5,000 Units Subcutaneous Q8H   insulin  aspart  0-20 Units Subcutaneous Q4H   leptospermum manuka honey  1 Application Topical Daily   multivitamin with minerals  1 tablet Per Tube Daily   potassium chloride   40 mEq Per Tube Q4H   pramipexole   0.25 mg Per Tube QHS   sodium bicarbonate   650 mg Per Tube TID   thiamine  (VITAMIN B1) injection  100 mg Intravenous Daily   Continuous Infusions:  feeding supplement (VITAL 1.5 CAL) 1,000 mL (07/02/24 0005)   piperacillin -tazobactam (ZOSYN )  IV 3.375 g (07/02/24 0549)   PRN Meds:.mouth rinse   I have personally reviewed following labs and imaging studies  LABORATORY DATA: CBC: Recent Labs  Lab 06/27/24 1453 06/27/24 1500 06/28/24 0247 06/28/24 0819 06/29/24 0240 06/30/24 0450 06/30/24 1310  WBC 24.6*  --  21.2*  --  16.2* 13.5* 11.3*  NEUTROABS 21.6*  --   --   --   --   --   --   HGB 9.5*   < > 10.2* 9.2* 8.4* 7.4* 7.8*  HCT 29.9*   < > 33.3* 27.0* 27.2* 23.4* 24.6*  MCV 98.7  --  100.3*  --  98.6 96.7 97.2  PLT 503*  --  459*  --  526* 460* 389   < > = values in this interval not displayed.    Basic Metabolic Panel: Recent Labs  Lab 06/27/24 1901 06/28/24 0247 06/29/24 0240 06/30/24 0450 06/30/24 1310 07/01/24 0345 07/01/24 0959 07/02/24 0352  NA 139   < > 136  --  138 139 141 143  K 3.0*   < > 4.1 3.4* 3.8 3.1* 3.2* 3.3*  CL 112*   < > 108  --  106  103 107 106  CO2 17*   < > 17*  --  23 22 22 24   GLUCOSE 59*   < > 246*  --  111* 187* 179* 217*  BUN 27*   < > 29*  --  33* 33* 33* 37*  CREATININE 2.36*   < > 2.82*  --  2.68* 2.80* 2.84* 2.90*  CALCIUM  7.0*   < >  7.0*  --  7.4* 7.4* 7.4* 7.3*  MG 1.8  --  1.6* 2.0  --  2.1  --   --   PHOS  --   --  3.5 3.0  --  3.1  --   --    < > = values in this interval not displayed.    GFR: Estimated Creatinine Clearance: 22.7 mL/min (A) (by C-G formula based on SCr of 2.9 mg/dL (H)).  Liver Function Tests: Recent Labs  Lab 06/27/24 1453 06/28/24 0247  AST 15 17  ALT <5 <5  ALKPHOS 115 112  BILITOT 0.4 0.6  PROT 4.6* 4.4*  ALBUMIN  <1.5* <1.5*   No results for input(s): LIPASE, AMYLASE in the last 168 hours. Recent Labs  Lab 06/27/24 1457  AMMONIA 33    Coagulation Profile: No results for input(s): INR, PROTIME in the last 168 hours.  Cardiac Enzymes: No results for input(s): CKTOTAL, CKMB, CKMBINDEX, TROPONINI in the last 168 hours.  BNP (last 3 results) No results for input(s): PROBNP in the last 8760 hours.  Lipid Profile: No results for input(s): CHOL, HDL, LDLCALC, TRIG, CHOLHDL, LDLDIRECT in the last 72 hours.  Thyroid Function Tests: No results for input(s): TSH, T4TOTAL, FREET4, T3FREE, THYROIDAB in the last 72 hours.  Anemia Panel: No results for input(s): VITAMINB12, FOLATE, FERRITIN, TIBC, IRON , RETICCTPCT in the last 72 hours.  Urine analysis:    Component Value Date/Time   COLORURINE YELLOW 06/29/2024 2307   APPEARANCEUR HAZY (A) 06/29/2024 2307   LABSPEC 1.028 06/29/2024 2307   PHURINE 5.0 06/29/2024 2307   GLUCOSEU 50 (A) 06/29/2024 2307   HGBUR SMALL (A) 06/29/2024 2307   BILIRUBINUR NEGATIVE 06/29/2024 2307   KETONESUR NEGATIVE 06/29/2024 2307   PROTEINUR 100 (A) 06/29/2024 2307   NITRITE NEGATIVE 06/29/2024 2307   LEUKOCYTESUR LARGE (A) 06/29/2024 2307    Sepsis Labs: Lactic Acid, Venous     Component Value Date/Time   LATICACIDVEN 1.2 06/27/2024 1501    MICROBIOLOGY: Recent Results (from the past 240 hours)  Blood Culture (routine x 2)     Status: None (Preliminary result)   Collection Time: 06/27/24  3:40 PM   Specimen: BLOOD  Result Value Ref Range Status   Specimen Description BLOOD LEFT ANTECUBITAL  Final   Special Requests   Final    BOTTLES DRAWN AEROBIC AND ANAEROBIC Blood Culture results may not be optimal due to an inadequate volume of blood received in culture bottles   Culture   Final    NO GROWTH 4 DAYS Performed at [facility], [address] [place], [place] [address]    Report Status PENDING  Incomplete  MRSA Next Gen by PCR, Nasal     Status: None   Collection Time: 06/27/24  4:30 PM   Specimen: Nasal Mucosa; Nasal Swab  Result Value Ref Range Status   MRSA by PCR Next Gen NOT DETECTED NOT DETECTED Final    Comment: (NOTE) The GeneXpert MRSA Assay (FDA approved for NASAL specimens only), is one component of a comprehensive MRSA colonization surveillance program. It is not intended to diagnose MRSA infection nor to guide or monitor treatment for MRSA infections. Test performance is not FDA approved in patients less than 91 years old. Performed at [facility], [address] [place], [place] [address]   Blood Culture (routine x 2)     Status: None (Preliminary result)   Collection Time: 06/27/24  6:34 PM   Specimen: BLOOD RIGHT HAND  Result Value Ref Range Status   Specimen Description BLOOD RIGHT HAND  Final   Special Requests   Final    BOTTLES DRAWN AEROBIC ONLY Blood Culture results may not be optimal due to an inadequate volume of blood received in culture bottles   Culture   Final    NO GROWTH 4 DAYS Performed at [facility], [address], [place], [place] [address]    Report Status PENDING  Incomplete  Culture, Respiratory w Gram Stain     Status: None   Collection Time: 06/28/24  8:00 AM   Specimen: Tracheal  Aspirate; Respiratory  Result Value Ref Range Status   Specimen Description TRACHEAL ASPIRATE  Final   Special Requests NONE  Final   Gram Stain   Final    MODERATE WBC PRESENT, PREDOMINANTLY PMN FEW SQUAMOUS EPITHELIAL CELLS PRESENT MODERATE YEAST FEW GRAM POSITIVE RODS Performed at [facility], [address], [place], [place] [address]    Culture MODERATE CANDIDA ALBICANS  Final   Report Status 07/01/2024 FINAL  Final  Urine Culture     Status: None   Collection Time: 06/29/24 11:07 PM   Specimen: Urine, Random  Result Value Ref Range Status   Specimen Description URINE, RANDOM  Final   Special Requests NONE Reflexed from [id number]  Final   Culture   Final    NO GROWTH Performed at [facility], [address], [place], [place] [address]    Report Status 06/30/2024 FINAL  Final    RADIOLOGY STUDIES/RESULTS: DG Abd Portable 1V Result Date: 06/30/2024 EXAM: 1 VIEW XRAY OF THE ABDOMEN 06/30/2024 04:44:00 PM COMPARISON: 06/27/2024 CLINICAL HISTORY: Encounter for feeding tube placement. FINDINGS: BOWEL: Gas-filled loops of small bowel are slightly less prominent than on the prior study. SOFT TISSUES: No opaque urinary calculi. BONES: No acute osseous abnormality. LINES AND TUBES: The tip of a small morphine  tube is in the third portion of the duodenum. IMPRESSION: 1. Tip of a small morphine  tube in the third portion of the duodenum. 2. Gas-filled loops of small bowel are slightly less prominent than on the prior study. Electronically signed by: Lonni Necessary MD 06/30/2024 04:50 PM EDT RP Workstation: [id number]     LOS: 5 days   Donalda Applebaum, MD  [facility]    To contact the attending provider between 7A-7P or the covering provider during after hours 7P-7A, please log into the web site [email or web address] and access using universal  password for that web site. If you do not have the password, please call the hospital operator.  07/02/2024, 9:11  AM

## 2024-07-02 NOTE — Plan of Care (Signed)
 And he reported that EKG obtained because bedside telemetry showing something abnormal. EKG showed normal sinus rhythm heart rate 69 sinus rhythm with premature ventricular complex and supraventricular premature complex. Patient is hemodynamically stable.  No chest pain and chest pressure. This patient was admitted for acute hypoxic respiratory failure and septic shock secondary to aspiration pneumonia required pressor support initially also has acute metabolic encephalopathy, CKD stage IV. -Starting low-dose Lopressor  12.5 mg twice daily. - Checking BMP and mag.  Evadne Ose, MD [facility] 07/02/2024, 7:29 PM

## 2024-07-02 NOTE — Plan of Care (Signed)
  Problem: Fluid Volume: Goal: Ability to maintain a balanced intake and output will improve Outcome: Progressing   Problem: Metabolic: Goal: Ability to maintain appropriate glucose levels will improve Outcome: Progressing   Problem: Nutritional: Goal: Maintenance of adequate nutrition will improve Outcome: Progressing

## 2024-07-03 DIAGNOSIS — J9601 Acute respiratory failure with hypoxia: Secondary | ICD-10-CM | POA: Diagnosis not present

## 2024-07-03 DIAGNOSIS — E43 Unspecified severe protein-calorie malnutrition: Secondary | ICD-10-CM | POA: Diagnosis not present

## 2024-07-03 DIAGNOSIS — R579 Shock, unspecified: Secondary | ICD-10-CM | POA: Diagnosis not present

## 2024-07-03 LAB — BASIC METABOLIC PANEL WITH GFR
Anion gap: 14 (ref 5–15)
BUN: 39 mg/dL — ABNORMAL HIGH (ref 8–23)
CO2: 25 mmol/L (ref 22–32)
Calcium: 7.5 mg/dL — ABNORMAL LOW (ref 8.9–10.3)
Chloride: 106 mmol/L (ref 98–111)
Creatinine, Ser: 2.7 mg/dL — ABNORMAL HIGH (ref 0.61–1.24)
GFR, Estimated: 23 mL/min — ABNORMAL LOW (ref >60)
Glucose, Bld: 220 mg/dL — ABNORMAL HIGH (ref 70–99)
Potassium: 3.6 mmol/L (ref 3.5–5.1)
Sodium: 145 mmol/L (ref 135–145)

## 2024-07-03 LAB — GLUCOSE, CAPILLARY
Glucose-Capillary: 145 mg/dL — ABNORMAL HIGH (ref 70–99)
Glucose-Capillary: 153 mg/dL — ABNORMAL HIGH (ref 70–99)
Glucose-Capillary: 226 mg/dL — ABNORMAL HIGH (ref 70–99)
Glucose-Capillary: 237 mg/dL — ABNORMAL HIGH (ref 70–99)
Glucose-Capillary: 242 mg/dL — ABNORMAL HIGH (ref 70–99)
Glucose-Capillary: 275 mg/dL — ABNORMAL HIGH (ref 70–99)

## 2024-07-03 LAB — CBC
HCT: 23.2 % — ABNORMAL LOW (ref 39.0–52.0)
Hemoglobin: 7.3 g/dL — ABNORMAL LOW (ref 13.0–17.0)
MCH: 30.4 pg (ref 26.0–34.0)
MCHC: 31.5 g/dL (ref 30.0–36.0)
MCV: 96.7 fL (ref 80.0–100.0)
Platelets: 289 K/uL (ref 150–400)
RBC: 2.4 MIL/uL — ABNORMAL LOW (ref 4.22–5.81)
RDW: 18.4 % — ABNORMAL HIGH (ref 11.5–15.5)
WBC: 11 K/uL — ABNORMAL HIGH (ref 4.0–10.5)
nRBC: 0.2 % (ref 0.0–0.2)

## 2024-07-03 MED ORDER — ALBUMIN HUMAN 25 % IV SOLN
25.0000 g | INTRAVENOUS | Status: AC
  Administered 2024-07-03: 25 g via INTRAVENOUS
  Filled 2024-07-03: qty 100

## 2024-07-03 NOTE — Progress Notes (Signed)
 PROGRESS NOTE        PATIENT DETAILS Name: Jake Bartlett Age: 83 y.o. Sex: male Date of Birth: Oct 21, 1941 Admit Date: 06/27/2024 Admitting Physician Sammi Fredericks, MD ERE:Bajwzs, Slater, MD  Brief Summary: Patient is a 83 y.o.  male with history of CKD 4, DM-2, CVA, Parkinson's disease-who was recently hospitalized from 8/27-9/5 for UTI-he was ultimately found to have cecal mass-underwent right hemicolectomy (pathology confirmed adenocarcinoma)-brought to the hospital on 9/7 for altered mental status-he was hypoglycemic with EMS-after correction of hypoglycemia-his mental status did not improve-he was subsequently intubated and admitted to the ICU service.  He was ultimately found to have septic shock in the setting of multifocal pneumonia-he was stabilized/extubated and then transferred to [facility].  See below for further details.  Significant events: 9/07>> admitted with septic shock likely due to aspiration pneumonia on vasopressor support  9/08>> central line placed for ongoing pressor support, tube feeds initiated 9/09>>extubated 9/10>> patient off pressors, unable to tolerate swallow, coretrack placed 9/12>> transferred to [facility]  Significant studies: 9/07>> chest x-ray: Dense left retrocardiac opacity-atelectasis versus aspiration pneumonia. 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 9/08>> MRI brain: No acute intracranial abnormality.  Significant microbiology data: 9/07>> blood culture: No growth 9/08>> tracheal aspirate: Moderate Candida albicans-likely colonization. 9/09>> urine culture: No growth  Procedures: See above  Consults: PCCM Neurology  Subjective: No major issues overnight-appears comfortable-but very frail.  Objective: Vitals: Blood pressure (!) 102/50, pulse 75,  temperature 99 F (37.2 C), temperature source Axillary, resp. rate (!) 24, weight 105 kg, SpO2 93%.   Exam: Relatively awake/alert-appears very frail Chest: Clear to auscultation anteriorly CVS: S1-S2 regular Abdomen: Soft nontender Extremities: No edema Neurology: Nonfocal but has diffuse generalized weakness.  Pertinent Labs/Radiology:    Latest Ref Rng & Units 06/30/2024    1:10 PM 06/30/2024    4:50 AM 06/29/2024    2:40 AM  CBC  WBC 4.0 - 10.5 K/uL 11.3  13.5  16.2   Hemoglobin 13.0 - 17.0 g/dL 7.8  7.4  8.4   Hematocrit 39.0 - 52.0 % 24.6  23.4  27.2   Platelets 150 - 400 K/uL 389  460  526     Lab Results  Component Value Date   NA 145 07/03/2024   K 3.6 07/03/2024   CL 106 07/03/2024   CO2 25 07/03/2024      Assessment/Plan: Acute hypoxic respiratory failure and septic shock secondary to aspiration pneumonia Sepsis physiology has resolved Stable on just 2 L of oxygen On Zosyn -per prior PCCM note-10 days planned. Incentive spirometry/flutter valve/mobilize and attempt to titrate down oxygen as tolerated  Acute metabolic encephalopathy Secondary to sepsis physiology/hypoxia Overall improved but now has ICU/hospital delirium Delirium precautions  CKD 4 Close to baseline  Normocytic anemia Secondary to critical illness superimposed on anemia of CKD No evidence of blood loss Monitor CBC and transfuse if significant drop.  DM-2 (A1c 5.8 on 8/27) Apparently hypoglycemic on initial presentation CBGs now stable for the past several days. Maintain on SSI.  Recent Labs    07/02/24 2308 07/03/24 0427 07/03/24 0823  GLUCAP 130* 226* 237*    HTN BP soft but stable No longer on midodrine   Oropharyngeal dysphagia Likely secondary to deconditioning/weakness/critical illness N.p.o.-NG tube in place  SLP following-await further recommendations.  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  Nutrition Status: Nutrition Problem: Severe Malnutrition Etiology: acute illness Signs/Symptoms: percent weight loss, moderate muscle depletion Percent weight loss: 12.4 % Interventions: MVI, Prostat, Tube feeding  Class 2 Obesity: Estimated body mass index is 35.2 kg/m as calculated from the following:   Height as of 06/22/24: 5' 8 (1.727 m).   Weight as of this encounter: 105 kg.   Wound care/pressure ulcer Note following wounds present prior to hospitalization Agree with assessment and plan per wound care RN. Wound type:  1.  R heel Unstageable Pressure Injury 80% black eschar 20% pink  2.  L heel Deep Tissue Pressure Injury purple maroon discoloration  3.  Full thickness R posterior leg pink  4. Partial thickness mid back pink  5.  Deep Tissue Pressure Injury sacrum purple maroon discoloration  6.  Partial thickness skin loss B shins pink moist, some scabbed 7. Stage 3 Pressure Injury R ankle/lower anterior leg 50% pink 50% yellow (from old traction)  8.  Midline abdomen healing surgical incision   Pressure Injury POA: Yes B heels and sacrum/ankle  Dressing procedure/placement/frequency:  Cleanse R heel with Vashe wound cleanser Soila [phone]) do not rinse and allow to air dry. Apply Medihoney to wound bed daily, cover with dry gauze and secure with Kerlix roll gauze.  Cleanse L heel with soap and water , dry and apply Xeroform gauze Soila [phone]) to purple discoloration daily.  Secure with silicone foam. Place B feet in Prevalon boots to offload pressure.  Cleanse  sacrum/buttocks with soap and water , dry and apply Xeroform gauze Soila [phone]) to purple discoloration daily. Secure with silicone foam.  Cleanse R posterior leg, mid back and B shin wounds with Vashe, cover with Xeroform gauze and secure with silicone foam.  Cleanse R ankle/lower leg wound with Vashe, using a Q tip applicator apply silver hydrofiber Soila [phone] Aquacel AG) to wound bed daily and secure with silicone foam.      Code status:   Code Status: Limited: Do not attempt resuscitation (DNR) -DNR-LIMITED -Do Not Intubate/DNI    DVT Prophylaxis: heparin  injection 5,000 Units Start: 06/28/24 0900 SCDs Start: 06/27/24 1630   Family Communication: Spouse-[name] Mcwethy-[phone] updated 9/12   Disposition Plan: Status is: Inpatient Remains inpatient appropriate because: Severity of illness   Planned Discharge Destination:Skilled nursing facility   Diet: Diet Order             Diet NPO time specified  Diet effective now                     Antimicrobial agents: Anti-infectives (From admission, onward)    Start     Dose/Rate Route Frequency Ordered Stop   06/29/24 1700  vancomycin  (VANCOREADY) IVPB 1250 mg/250 mL  Status:  Discontinued        1,250 mg 166.7 mL/hr over 90 Minutes Intravenous Every 48 hours 06/27/24 1751 06/28/24 0801   06/27/24 1845  piperacillin -tazobactam (ZOSYN ) IVPB 3.375 g        3.375 g 12.5 mL/hr over  240 Minutes Intravenous Every 8 hours 06/27/24 1749 07/03/24 2359   06/27/24 1600  vancomycin  (VANCOREADY) IVPB 1500 mg/300 mL        1,500 mg 150 mL/hr over 120 Minutes Intravenous  Once 06/27/24 1555 06/27/24 2025   06/27/24 1545  ceFEPIme (MAXIPIME) 2 g in sodium chloride  0.9 % 100 mL IVPB  Status:  Discontinued        2 g 200 mL/hr over 30 Minutes Intravenous  Once 06/27/24 1542 06/27/24 1749   06/27/24 1545  metroNIDAZOLE  (FLAGYL ) IVPB 500 mg  Status:  Discontinued        500 mg 100 mL/hr over 60 Minutes Intravenous  Once 06/27/24  1542 06/27/24 1749   06/27/24 1545  vancomycin  (VANCOCIN ) IVPB 1000 mg/200 mL premix  Status:  Discontinued        1,000 mg 200 mL/hr over 60 Minutes Intravenous  Once 06/27/24 1542 06/27/24 1555        MEDICATIONS: Scheduled Meds:  carbidopa -levodopa   1 tablet Per Tube Q8H   Chlorhexidine  Gluconate Cloth  6 each Topical Daily   escitalopram   10 mg Per Tube Daily   feeding supplement (PROSource TF20)  60 mL Per Tube Daily   heparin  injection (subcutaneous)  5,000 Units Subcutaneous Q8H   insulin  aspart  0-20 Units Subcutaneous Q4H   leptospermum manuka honey  1 Application Topical Daily   metoprolol  tartrate  12.5 mg Oral BID   multivitamin with minerals  1 tablet Per Tube Daily   pramipexole   0.25 mg Per Tube QHS   sodium bicarbonate   650 mg Per Tube TID   thiamine  (VITAMIN B1) injection  100 mg Intravenous Daily   Continuous Infusions:  feeding supplement (VITAL 1.5 CAL) 1,000 mL (07/02/24 1310)   piperacillin -tazobactam (ZOSYN )  IV 3.375 g (07/03/24 0701)   PRN Meds:.mouth rinse   I have personally reviewed following labs and imaging studies  LABORATORY DATA: CBC: Recent Labs  Lab 06/27/24 1453 06/27/24 1500 06/28/24 0247 06/28/24 0819 06/29/24 0240 06/30/24 0450 06/30/24 1310  WBC 24.6*  --  21.2*  --  16.2* 13.5* 11.3*  NEUTROABS 21.6*  --   --   --   --   --   --   HGB 9.5*   < > 10.2* 9.2* 8.4* 7.4* 7.8*  HCT 29.9*   < > 33.3* 27.0* 27.2* 23.4* 24.6*  MCV 98.7  --  100.3*  --  98.6 96.7 97.2  PLT 503*  --  459*  --  526* 460* 389   < > = values in this interval not displayed.    Basic Metabolic Panel: Recent Labs  Lab 06/27/24 1901 06/28/24 0247 06/29/24 0240 06/30/24 0450 06/30/24 1310 07/01/24 0345 07/01/24 0959 07/02/24 0352 07/02/24 2028 07/03/24 0407  NA 139   < > 136  --    < > 139 141 143 145 145  K 3.0*   < > 4.1 3.4*   < > 3.1* 3.2* 3.3* 3.6 3.6  CL 112*   < > 108  --    < > 103 107 106 106 106  CO2 17*   < > 17*  --    < > 22 22 24  23 25   GLUCOSE 59*   < > 246*  --    < > 187* 179* 217* 208* 220*  BUN 27*   < > 29*  --    < > 33* 33* 37* 40* 39*  CREATININE 2.36*   < > 2.82*  --    < >  2.80* 2.84* 2.90* 2.86* 2.70*  CALCIUM  7.0*   < > 7.0*  --    < > 7.4* 7.4* 7.3* 7.6* 7.5*  MG 1.8  --  1.6* 2.0  --  2.1  --   --  1.9  --   PHOS  --   --  3.5 3.0  --  3.1  --   --   --   --    < > = values in this interval not displayed.    GFR: Estimated Creatinine Clearance: 23.9 mL/min (A) (by C-G formula based on SCr of 2.7 mg/dL (H)).  Liver Function Tests: Recent Labs  Lab 06/27/24 1453 06/28/24 0247  AST 15 17  ALT <5 <5  ALKPHOS 115 112  BILITOT 0.4 0.6  PROT 4.6* 4.4*  ALBUMIN  <1.5* <1.5*   No results for input(s): LIPASE, AMYLASE in the last 168 hours. Recent Labs  Lab 06/27/24 1457  AMMONIA 33    Coagulation Profile: No results for input(s): INR, PROTIME in the last 168 hours.  Cardiac Enzymes: No results for input(s): CKTOTAL, CKMB, CKMBINDEX, TROPONINI in the last 168 hours.  BNP (last 3 results) No results for input(s): PROBNP in the last 8760 hours.  Lipid Profile: No results for input(s): CHOL, HDL, LDLCALC, TRIG, CHOLHDL, LDLDIRECT in the last 72 hours.  Thyroid Function Tests: No results for input(s): TSH, T4TOTAL, FREET4, T3FREE, THYROIDAB in the last 72 hours.  Anemia Panel: No results for input(s): VITAMINB12, FOLATE, FERRITIN, TIBC, IRON , RETICCTPCT in the last 72 hours.  Urine analysis:    Component Value Date/Time   COLORURINE YELLOW 06/29/2024 2307   APPEARANCEUR HAZY (A) 06/29/2024 2307   LABSPEC 1.028 06/29/2024 2307   PHURINE 5.0 06/29/2024 2307   GLUCOSEU 50 (A) 06/29/2024 2307   HGBUR SMALL (A) 06/29/2024 2307   BILIRUBINUR NEGATIVE 06/29/2024 2307   KETONESUR NEGATIVE 06/29/2024 2307   PROTEINUR 100 (A) 06/29/2024 2307   NITRITE NEGATIVE 06/29/2024 2307   LEUKOCYTESUR LARGE (A) 06/29/2024 2307    Sepsis  Labs: Lactic Acid, Venous    Component Value Date/Time   LATICACIDVEN 1.2 06/27/2024 1501    MICROBIOLOGY: Recent Results (from the past 240 hours)  Blood Culture (routine x 2)     Status: None   Collection Time: 06/27/24  3:40 PM   Specimen: BLOOD  Result Value Ref Range Status   Specimen Description BLOOD LEFT ANTECUBITAL  Final   Special Requests   Final    BOTTLES DRAWN AEROBIC AND ANAEROBIC Blood Culture results may not be optimal due to an inadequate volume of blood received in culture bottles   Culture   Final    NO GROWTH 5 DAYS Performed at [facility], [address], [place], [place] [address]    Report Status 07/02/2024 FINAL  Final  MRSA Next Gen by PCR, Nasal     Status: None   Collection Time: 06/27/24  4:30 PM   Specimen: Nasal Mucosa; Nasal Swab  Result Value Ref Range Status   MRSA by PCR Next Gen NOT DETECTED NOT DETECTED Final    Comment: (NOTE) The GeneXpert MRSA Assay (FDA approved for NASAL specimens only), is one component of a comprehensive MRSA colonization surveillance program. It is not intended to diagnose MRSA infection nor to guide or monitor treatment for MRSA infections. Test performance is not FDA approved in patients less than 51 years old. Performed at [facility], [address], [place], [place] [address]   Blood Culture (  routine x 2)     Status: None   Collection Time: 06/27/24  6:34 PM   Specimen: BLOOD RIGHT HAND  Result Value Ref Range Status   Specimen Description BLOOD RIGHT HAND  Final   Special Requests   Final    BOTTLES DRAWN AEROBIC ONLY Blood Culture results may not be optimal due to an inadequate volume of blood received in culture bottles   Culture   Final    NO GROWTH 5 DAYS Performed at [facility], [address], [place], [place] [address]    Report Status 07/02/2024 FINAL  Final  Culture, Respiratory w Gram Stain     Status: None   Collection Time: 06/28/24  8:00 AM   Specimen: Tracheal  Aspirate; Respiratory  Result Value Ref Range Status   Specimen Description TRACHEAL ASPIRATE  Final   Special Requests NONE  Final   Gram Stain   Final    MODERATE WBC PRESENT, PREDOMINANTLY PMN FEW SQUAMOUS EPITHELIAL CELLS PRESENT MODERATE YEAST FEW GRAM POSITIVE RODS Performed at [facility], [address], [place], [place] [address]    Culture MODERATE CANDIDA ALBICANS  Final   Report Status 07/01/2024 FINAL  Final  Urine Culture     Status: None   Collection Time: 06/29/24 11:07 PM   Specimen: Urine, Random  Result Value Ref Range Status   Specimen Description URINE, RANDOM  Final   Special Requests NONE Reflexed from [id number]  Final   Culture   Final    NO GROWTH Performed at [facility], [address] [place], [place] [address]    Report Status 06/30/2024 FINAL  Final    RADIOLOGY STUDIES/RESULTS: No results found.    LOS: 6 days   Donalda Applebaum, MD  [facility]    To contact the attending provider between 7A-7P or the covering provider during after hours 7P-7A, please log into the web site [email or web address] and access using universal [place] password for that web site. If you do not have the password, please call the hospital operator.  07/03/2024, 10:45 AM

## 2024-07-03 NOTE — Plan of Care (Signed)
  Problem: Education: Goal: Individualized Educational Video(s) Outcome: Not Progressing   Problem: Metabolic: Goal: Ability to maintain appropriate glucose levels will improve Outcome: Not Progressing   Problem: Tissue Perfusion: Goal: Adequacy of tissue perfusion will improve Outcome: Progressing   Problem: Activity: Goal: Ability to tolerate increased activity will improve Outcome: Not Progressing

## 2024-07-03 NOTE — Plan of Care (Signed)
   Problem: Education: Goal: Ability to describe self-care measures that may prevent or decrease complications (Diabetes Survival Skills Education) will improve Outcome: Progressing Goal: Individualized Educational Video(s) Outcome: Progressing   Problem: Coping: Goal: Ability to adjust to condition or change in health will improve Outcome: Progressing   Problem: Fluid Volume: Goal: Ability to maintain a balanced intake and output will improve Outcome: Progressing   Problem: Health Behavior/Discharge Planning: Goal: Ability to identify and utilize available resources and services will improve Outcome: Progressing Goal: Ability to manage health-related needs will improve Outcome: Progressing   Problem: Metabolic: Goal: Ability to maintain appropriate glucose levels will improve Outcome: Progressing   Problem: Nutritional: Goal: Maintenance of adequate nutrition will improve Outcome: Progressing Goal: Progress toward achieving an optimal weight will improve Outcome: Progressing   Problem: Skin Integrity: Goal: Risk for impaired skin integrity will decrease Outcome: Progressing   Problem: Tissue Perfusion: Goal: Adequacy of tissue perfusion will improve Outcome: Progressing   Problem: Activity: Goal: Ability to tolerate increased activity will improve Outcome: Progressing   Problem: Respiratory: Goal: Ability to maintain a clear airway and adequate ventilation will improve Outcome: Progressing   Problem: Role Relationship: Goal: Method of communication will improve Outcome: Progressing   Problem: Education: Goal: Knowledge of General Education information will improve Description: Including pain rating scale, medication(s)/side effects and non-pharmacologic comfort measures Outcome: Progressing   Problem: Health Behavior/Discharge Planning: Goal: Ability to manage health-related needs will improve Outcome: Progressing   Problem: Clinical Measurements: Goal:  Ability to maintain clinical measurements within normal limits will improve Outcome: Progressing Goal: Will remain free from infection Outcome: Progressing Goal: Diagnostic test results will improve Outcome: Progressing Goal: Respiratory complications will improve Outcome: Progressing Goal: Cardiovascular complication will be avoided Outcome: Progressing   Problem: Activity: Goal: Risk for activity intolerance will decrease Outcome: Progressing   Problem: Nutrition: Goal: Adequate nutrition will be maintained Outcome: Progressing   Problem: Coping: Goal: Level of anxiety will decrease Outcome: Progressing   Problem: Elimination: Goal: Will not experience complications related to bowel motility Outcome: Progressing Goal: Will not experience complications related to urinary retention Outcome: Progressing   Problem: Pain Managment: Goal: General experience of comfort will improve and/or be controlled Outcome: Progressing   Problem: Safety: Goal: Ability to remain free from injury will improve Outcome: Progressing   Problem: Skin Integrity: Goal: Risk for impaired skin integrity will decrease Outcome: Progressing

## 2024-07-04 DIAGNOSIS — R579 Shock, unspecified: Secondary | ICD-10-CM | POA: Diagnosis not present

## 2024-07-04 DIAGNOSIS — E43 Unspecified severe protein-calorie malnutrition: Secondary | ICD-10-CM | POA: Diagnosis not present

## 2024-07-04 DIAGNOSIS — J9601 Acute respiratory failure with hypoxia: Secondary | ICD-10-CM | POA: Diagnosis not present

## 2024-07-04 LAB — BASIC METABOLIC PANEL WITH GFR
Anion gap: 16 — ABNORMAL HIGH (ref 5–15)
BUN: 42 mg/dL — ABNORMAL HIGH (ref 8–23)
CO2: 26 mmol/L (ref 22–32)
Calcium: 7.5 mg/dL — ABNORMAL LOW (ref 8.9–10.3)
Chloride: 105 mmol/L (ref 98–111)
Creatinine, Ser: 2.61 mg/dL — ABNORMAL HIGH (ref 0.61–1.24)
GFR, Estimated: 23 mL/min — ABNORMAL LOW (ref >60)
Glucose, Bld: 170 mg/dL — ABNORMAL HIGH (ref 70–99)
Potassium: 3.2 mmol/L — ABNORMAL LOW (ref 3.5–5.1)
Sodium: 147 mmol/L — ABNORMAL HIGH (ref 135–145)

## 2024-07-04 LAB — CBC
HCT: 24.6 % — ABNORMAL LOW (ref 39.0–52.0)
Hemoglobin: 7.8 g/dL — ABNORMAL LOW (ref 13.0–17.0)
MCH: 31.1 pg (ref 26.0–34.0)
MCHC: 31.7 g/dL (ref 30.0–36.0)
MCV: 98 fL (ref 80.0–100.0)
Platelets: 266 K/uL (ref 150–400)
RBC: 2.51 MIL/uL — ABNORMAL LOW (ref 4.22–5.81)
RDW: 18.6 % — ABNORMAL HIGH (ref 11.5–15.5)
WBC: 10.5 K/uL (ref 4.0–10.5)
nRBC: 0.2 % (ref 0.0–0.2)

## 2024-07-04 LAB — GLUCOSE, CAPILLARY
Glucose-Capillary: 169 mg/dL — ABNORMAL HIGH (ref 70–99)
Glucose-Capillary: 176 mg/dL — ABNORMAL HIGH (ref 70–99)
Glucose-Capillary: 230 mg/dL — ABNORMAL HIGH (ref 70–99)
Glucose-Capillary: 208 mg/dL — ABNORMAL HIGH (ref 70–99)
Glucose-Capillary: 200 mg/dL — ABNORMAL HIGH (ref 70–99)
Glucose-Capillary: 219 mg/dL — ABNORMAL HIGH (ref 70–99)
Glucose-Capillary: 204 mg/dL — ABNORMAL HIGH (ref 70–99)

## 2024-07-04 MED ORDER — FREE WATER
200.0000 mL | Freq: Four times a day (QID) | Status: DC
  Administered 2024-07-04 – 2024-07-05 (×5): 200 mL

## 2024-07-04 MED ORDER — POTASSIUM CHLORIDE 20 MEQ PO PACK
40.0000 meq | PACK | Freq: Once | ORAL | Status: AC
  Administered 2024-07-04: 40 meq
  Filled 2024-07-04: qty 2

## 2024-07-04 MED ORDER — MIDODRINE HCL 5 MG PO TABS
10.0000 mg | ORAL_TABLET | Freq: Three times a day (TID) | ORAL | Status: DC
  Filled 2024-07-04: qty 2

## 2024-07-04 MED ORDER — MIDODRINE HCL 5 MG PO TABS
10.0000 mg | ORAL_TABLET | Freq: Three times a day (TID) | ORAL | Status: DC
  Administered 2024-07-04 – 2024-07-05 (×3): 10 mg
  Filled 2024-07-04 (×2): qty 2

## 2024-07-04 NOTE — Progress Notes (Addendum)
 PROGRESS NOTE        PATIENT DETAILS Name: Jake Bartlett Age: 83 y.o. Sex: male Date of Birth: 01/10/1941 Admit Date: 06/27/2024 Admitting Physician Sammi Fredericks, MD ERE:Bajwzs, Slater, MD  Brief Summary: Patient is a 83 y.o.  male with history of CKD 4, DM-2, CVA, Parkinson's disease-who was recently hospitalized from 8/27-9/5 for UTI-he was ultimately found to have cecal mass-underwent right hemicolectomy (pathology confirmed adenocarcinoma)-brought to the hospital on 9/7 for altered mental status-he was hypoglycemic with EMS-after correction of hypoglycemia-his mental status did not improve-he was subsequently intubated and admitted to the ICU service.  He was ultimately found to have septic shock in the setting of multifocal pneumonia-he was stabilized/extubated and then transferred to [facility].  See below for further details.  Significant events: 9/07>> admitted with septic shock likely due to aspiration pneumonia on vasopressor support  9/08>> central line placed for ongoing pressor support, tube feeds initiated 9/09>>extubated 9/10>> patient off pressors, unable to tolerate swallow, coretrack placed 9/12>> transferred to [facility]  Significant studies: 9/07>> chest x-ray: Dense left retrocardiac opacity-atelectasis versus aspiration pneumonia. 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 9/08>> MRI brain: No acute intracranial abnormality.  Significant microbiology data: 9/07>> blood culture: No growth 9/08>> tracheal aspirate: Moderate Candida albicans-likely colonization. 9/09>> urine culture: No growth  Procedures: See above  Consults: PCCM Neurology  Subjective: Appears unchanged-very frail-sleeping mostly but easily awakes-able to tell me his name-answers few other questions appropriately  and then goes back to sleep.  Objective: Vitals: Blood pressure (!) 100/59, pulse 80, temperature 98.2 F (36.8 C), temperature source Oral, resp. rate (!) 26, weight 77.5 kg, SpO2 97%.   Exam: Sleepy but awake/alert-very frail/deconditioned appearing HEENT:atraumatic, normocephalic Chest: B/L clear to auscultation anteriorly CVS:S1S2 regular Abdomen:soft non tender, non distended Extremities:no edema Neurology: Non focal-significant generalized weakness. Skin: no rash  Pertinent Labs/Radiology:    Latest Ref Rng & Units 07/04/2024    3:45 AM 07/03/2024    3:59 PM 06/30/2024    1:10 PM  CBC  WBC 4.0 - 10.5 K/uL 10.5  11.0  11.3   Hemoglobin 13.0 - 17.0 g/dL 7.8  7.3  7.8   Hematocrit 39.0 - 52.0 % 24.6  23.2  24.6   Platelets 150 - 400 K/uL 266  289  389     Lab Results  Component Value Date   NA 147 (H) 07/04/2024   K 3.2 (L) 07/04/2024   CL 105 07/04/2024   CO2 26 07/04/2024      Assessment/Plan: Acute hypoxic respiratory failure and septic shock secondary to aspiration pneumonia Sepsis physiology has resolved Stable on just 2 L of oxygen On Zosyn -per prior PCCM note-10 days planned. Incentive spirometry/flutter valve/mobilize and attempt to titrate down oxygen as tolerated  Acute metabolic encephalopathy Secondary to sepsis physiology/hypoxia Overall improved --has waxing/waning hospital delirium Maintain delirium precautions  CKD 4 Close to baseline  Normocytic anemia Secondary to critical illness superimposed on anemia of CKD No evidence of blood loss Monitor CBC and transfuse if significant drop.  DM-2 (A1c 5.8 on 8/27) Apparently hypoglycemic on initial presentation CBGs now stable for the past several days. Maintain on SSI.  Recent Labs    07/04/24 0338 07/04/24 0716 07/04/24 1125  GLUCAP 169* 176* 230*  HTN BP soft but stable No longer on midodrine   Oropharyngeal dysphagia Likely secondary to deconditioning/weakness/critical  illness N.p.o.-NG tube in place SLP following-await further recommendations.  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  Palliative care/advance directives DNR in place Spoke with spouse on 9/14-she and her children have been discussing end-of-life issues -given advanced age/frailty and his current situation with encephalopathy/weakness.  We discussed options of transitioning to hospice care or giving it another 1-2 days to see if he will improve further before we make any changes.  We discussed that even if he survives this hospitalization-it is unlikely that he will revert back to his prior functionality.  I encouraged her to keep discussing with her children-and to continue to think about it-in the interim-Will consult palliative care.  Nutrition Status: Nutrition Problem: Severe Malnutrition Etiology: acute illness Signs/Symptoms: percent weight loss, moderate muscle depletion Percent weight loss: 12.4 % Interventions: MVI, Prostat, Tube feeding  Class 2 Obesity: Estimated body mass index is 25.98 kg/m as calculated from the following:   Height as of 06/22/24: 5' 8 (1.727 m).   Weight as of this encounter: 77.5 kg.   Wound care/pressure ulcer Note following wounds present prior to hospitalization Agree with assessment and plan per wound care RN. Wound type:  1.  R heel Unstageable Pressure Injury 80% black  eschar 20% pink  2.  L heel Deep Tissue Pressure Injury purple maroon discoloration  3.  Full thickness R posterior leg pink  4. Partial thickness mid back pink  5.  Deep Tissue Pressure Injury sacrum purple maroon discoloration  6.  Partial thickness skin loss B shins pink moist, some scabbed 7. Stage 3 Pressure Injury R ankle/lower anterior leg 50% pink 50% yellow (from old traction)  8.  Midline abdomen healing surgical incision   Pressure Injury POA: Yes B heels and sacrum/ankle  Dressing procedure/placement/frequency:  Cleanse R heel with Vashe wound cleanser Soila [phone]) do not rinse and allow to air dry. Apply Medihoney to wound bed daily, cover with dry gauze and secure with Kerlix roll gauze.  Cleanse L heel with soap and water , dry and apply Xeroform gauze Soila [phone]) to purple discoloration daily.  Secure with silicone foam. Place B feet in Prevalon boots to offload pressure.  Cleanse sacrum/buttocks with soap and water , dry and apply Xeroform gauze Soila [phone]) to purple discoloration daily. Secure with silicone foam.  Cleanse R posterior leg, mid back and B shin wounds with Vashe, cover with Xeroform gauze and secure with silicone foam.  Cleanse R ankle/lower leg wound with Vashe, using a Q tip applicator apply silver hydrofiber Soila [phone] [employer]) to wound bed daily and secure with silicone foam.      Code status:   Code Status: Limited: Do not attempt resuscitation (DNR) -DNR-LIMITED -Do Not Intubate/DNI    DVT Prophylaxis: heparin  injection 5,000 Units Start: 06/28/24 0900 SCDs Start: 06/27/24 1630   Family Communication: Spouse-[name] [phone] updated at bedside on 9/14.   Disposition Plan: Status is: Inpatient Remains  inpatient appropriate because: Severity of illness   Planned Discharge Destination:Skilled nursing facility   Diet: Diet Order             Diet NPO time specified  Diet effective now                      Antimicrobial agents: Anti-infectives (From admission, onward)    Start     Dose/Rate Route Frequency Ordered Stop   06/29/24 1700  vancomycin  (VANCOREADY) IVPB 1250 mg/250 mL  Status:  Discontinued        1,250 mg 166.7 mL/hr over 90 Minutes Intravenous Every 48 hours 06/27/24 1751 06/28/24 0801   06/27/24 1845  piperacillin -tazobactam (ZOSYN ) IVPB 3.375 g        3.375 g 12.5 mL/hr over 240 Minutes Intravenous Every 8 hours 06/27/24 1749 07/06/24 2359   06/27/24 1600  vancomycin  (VANCOREADY) IVPB 1500 mg/300 mL        1,500 mg 150 mL/hr over 120 Minutes Intravenous  Once 06/27/24 1555 06/27/24 2025   06/27/24 1545  ceFEPIme (MAXIPIME) 2 g in sodium chloride  0.9 % 100 mL IVPB  Status:  Discontinued        2 g 200 mL/hr over 30 Minutes Intravenous  Once 06/27/24 1542 06/27/24 1749   06/27/24 1545  metroNIDAZOLE  (FLAGYL ) IVPB 500 mg  Status:  Discontinued        500 mg 100 mL/hr over 60 Minutes Intravenous  Once 06/27/24 1542 06/27/24 1749   06/27/24 1545  vancomycin  (VANCOCIN ) IVPB 1000 mg/200 mL premix  Status:  Discontinued        1,000 mg 200 mL/hr over 60 Minutes Intravenous  Once 06/27/24 1542 06/27/24 1555        MEDICATIONS: Scheduled Meds:  carbidopa -levodopa   1 tablet Per Tube Q8H   Chlorhexidine  Gluconate Cloth  6 each Topical Daily   escitalopram   10 mg Per Tube Daily   feeding supplement (PROSource TF20)  60 mL Per Tube Daily   free water   200 mL Per Tube Q6H   heparin  injection (subcutaneous)  5,000 Units Subcutaneous Q8H   insulin  aspart  0-20 Units Subcutaneous Q4H   leptospermum manuka honey  1 Application Topical Daily   midodrine   10 mg Oral TID WC   multivitamin with minerals  1 tablet Per Tube Daily   pramipexole   0.25 mg Per Tube QHS   sodium bicarbonate   650 mg Per Tube TID   thiamine  (VITAMIN B1) injection  100 mg Intravenous Daily   Continuous Infusions:  feeding supplement (VITAL 1.5 CAL) 1,000 mL (07/02/24 1310)   piperacillin -tazobactam  (ZOSYN )  IV 3.375 g (07/04/24 0549)   PRN Meds:.mouth rinse   I have personally reviewed following labs and imaging studies  LABORATORY DATA: CBC: Recent Labs  Lab 06/27/24 1453 06/27/24 1500 06/29/24 0240 06/30/24 0450 06/30/24 1310 07/03/24 1559 07/04/24 0345  WBC 24.6*   < > 16.2* 13.5* 11.3* 11.0* 10.5  NEUTROABS 21.6*  --   --   --   --   --   --   HGB 9.5*   < > 8.4* 7.4* 7.8* 7.3* 7.8*  HCT 29.9*   < > 27.2* 23.4* 24.6* 23.2* 24.6*  MCV 98.7   < > 98.6 96.7 97.2 96.7 98.0  PLT 503*   < > 526* 460* 389 289 266   < > = values in this interval not displayed.    Basic Metabolic Panel: Recent Labs  Lab 06/27/24 1901 06/28/24 0247  06/29/24 0240 06/30/24 0450 06/30/24 1310 07/01/24 0345 07/01/24 0959 07/02/24 0352 07/02/24 2028 07/03/24 0407 07/04/24 0345  NA 139   < > 136  --    < > 139 141 143 145 145 147*  K 3.0*   < > 4.1 3.4*   < > 3.1* 3.2* 3.3* 3.6 3.6 3.2*  CL 112*   < > 108  --    < > 103 107 106 106 106 105  CO2 17*   < > 17*  --    < > 22 22 24 23 25 26   GLUCOSE 59*   < > 246*  --    < > 187* 179* 217* 208* 220* 170*  BUN 27*   < > 29*  --    < > 33* 33* 37* 40* 39* 42*  CREATININE 2.36*   < > 2.82*  --    < > 2.80* 2.84* 2.90* 2.86* 2.70* 2.61*  CALCIUM  7.0*   < > 7.0*  --    < > 7.4* 7.4* 7.3* 7.6* 7.5* 7.5*  MG 1.8  --  1.6* 2.0  --  2.1  --   --  1.9  --   --   PHOS  --   --  3.5 3.0  --  3.1  --   --   --   --   --    < > = values in this interval not displayed.    GFR: Estimated Creatinine Clearance: 20.4 mL/min (A) (by C-G formula based on SCr of 2.61 mg/dL (H)).  Liver Function Tests: Recent Labs  Lab 06/27/24 1453 06/28/24 0247  AST 15 17  ALT <5 <5  ALKPHOS 115 112  BILITOT 0.4 0.6  PROT 4.6* 4.4*  ALBUMIN  <1.5* <1.5*   No results for input(s): LIPASE, AMYLASE in the last 168 hours. Recent Labs  Lab 06/27/24 1457  AMMONIA 33    Coagulation Profile: No results for input(s): INR, PROTIME in the last 168  hours.  Cardiac Enzymes: No results for input(s): CKTOTAL, CKMB, CKMBINDEX, TROPONINI in the last 168 hours.  BNP (last 3 results) No results for input(s): PROBNP in the last 8760 hours.  Lipid Profile: No results for input(s): CHOL, HDL, LDLCALC, TRIG, CHOLHDL, LDLDIRECT in the last 72 hours.  Thyroid Function Tests: No results for input(s): TSH, T4TOTAL, FREET4, T3FREE, THYROIDAB in the last 72 hours.  Anemia Panel: No results for input(s): VITAMINB12, FOLATE, FERRITIN, TIBC, IRON , RETICCTPCT in the last 72 hours.  Urine analysis:    Component Value Date/Time   COLORURINE YELLOW 06/29/2024 2307   APPEARANCEUR HAZY (A) 06/29/2024 2307   LABSPEC 1.028 06/29/2024 2307   PHURINE 5.0 06/29/2024 2307   GLUCOSEU 50 (A) 06/29/2024 2307   HGBUR SMALL (A) 06/29/2024 2307   BILIRUBINUR NEGATIVE 06/29/2024 2307   KETONESUR NEGATIVE 06/29/2024 2307   PROTEINUR 100 (A) 06/29/2024 2307   NITRITE NEGATIVE 06/29/2024 2307   LEUKOCYTESUR LARGE (A) 06/29/2024 2307    Sepsis Labs: Lactic Acid, Venous    Component Value Date/Time   LATICACIDVEN 1.2 06/27/2024 1501    MICROBIOLOGY: Recent Results (from the past 240 hours)  Blood Culture (routine x 2)     Status: None   Collection Time: 06/27/24  3:40 PM   Specimen: BLOOD  Result Value Ref Range Status   Specimen Description BLOOD LEFT ANTECUBITAL  Final   Special Requests   Final    BOTTLES DRAWN AEROBIC AND ANAEROBIC Blood Culture results may not be  optimal due to an inadequate volume of blood received in culture bottles   Culture   Final    NO GROWTH 5 DAYS Performed at [facility], [address] [place], [place] [address]    Report Status 07/02/2024 FINAL  Final  MRSA Next Gen by PCR, Nasal     Status: None   Collection Time: 06/27/24  4:30 PM   Specimen: Nasal Mucosa; Nasal Swab  Result Value Ref Range Status   MRSA by PCR Next Gen NOT DETECTED NOT DETECTED Final     Comment: (NOTE) The GeneXpert MRSA Assay (FDA approved for NASAL specimens only), is one component of a comprehensive MRSA colonization surveillance program. It is not intended to diagnose MRSA infection nor to guide or monitor treatment for MRSA infections. Test performance is not FDA approved in patients less than 16 years old. Performed at [facility], [address] [place], [place] [address]   Blood Culture (routine x 2)     Status: None   Collection Time: 06/27/24  6:34 PM   Specimen: BLOOD RIGHT HAND  Result Value Ref Range Status   Specimen Description BLOOD RIGHT HAND  Final   Special Requests   Final    BOTTLES DRAWN AEROBIC ONLY Blood Culture results may not be optimal due to an inadequate volume of blood received in culture bottles   Culture   Final    NO GROWTH 5 DAYS Performed at [facility], [address], [place], [place] [address]    Report Status 07/02/2024 FINAL  Final  Culture, Respiratory w Gram Stain     Status: None   Collection Time: 06/28/24  8:00 AM   Specimen: Tracheal Aspirate; Respiratory  Result Value Ref Range Status   Specimen Description TRACHEAL ASPIRATE  Final   Special Requests NONE  Final   Gram Stain   Final    MODERATE WBC PRESENT, PREDOMINANTLY PMN FEW SQUAMOUS EPITHELIAL CELLS PRESENT MODERATE YEAST FEW GRAM POSITIVE RODS Performed at [facility], [address], [place], [place] [address]    Culture MODERATE CANDIDA ALBICANS  Final   Report Status 07/01/2024 FINAL  Final  Urine Culture     Status: None   Collection Time: 06/29/24 11:07 PM   Specimen: Urine, Random  Result Value Ref Range Status   Specimen Description URINE, RANDOM  Final   Special Requests NONE Reflexed from [id number]  Final   Culture   Final    NO GROWTH Performed at [facility], [address] [place], [place] [address]    Report Status 06/30/2024 FINAL  Final    RADIOLOGY STUDIES/RESULTS: No results found.    LOS: 7 days    Donalda Applebaum, MD  [facility]    To contact the attending provider between 7A-7P or the covering provider during after hours 7P-7A, please log into the web site [email or web address] and access using universal [place] password for that web site. If you do not have the password, please call the hospital operator.  07/04/2024, 1:08 PM

## 2024-07-04 NOTE — Plan of Care (Signed)
  Problem: Coping: Goal: Ability to adjust to condition or change in health will improve Outcome: Progressing   Problem: Fluid Volume: Goal: Ability to maintain a balanced intake and output will improve Outcome: Progressing   Problem: Health Behavior/Discharge Planning: Goal: Ability to identify and utilize available resources and services will improve Outcome: Progressing Goal: Ability to manage health-related needs will improve Outcome: Progressing   Problem: Tissue Perfusion: Goal: Adequacy of tissue perfusion will improve Outcome: Progressing   Problem: Activity: Goal: Risk for activity intolerance will decrease Outcome: Progressing   Problem: Coping: Goal: Level of anxiety will decrease Outcome: Progressing

## 2024-07-05 DIAGNOSIS — E162 Hypoglycemia, unspecified: Secondary | ICD-10-CM

## 2024-07-05 DIAGNOSIS — Z66 Do not resuscitate: Secondary | ICD-10-CM

## 2024-07-05 DIAGNOSIS — Z515 Encounter for palliative care: Secondary | ICD-10-CM

## 2024-07-05 DIAGNOSIS — Z7189 Other specified counseling: Secondary | ICD-10-CM

## 2024-07-05 DIAGNOSIS — R638 Other symptoms and signs concerning food and fluid intake: Secondary | ICD-10-CM

## 2024-07-05 DIAGNOSIS — Z789 Other specified health status: Secondary | ICD-10-CM

## 2024-07-05 DIAGNOSIS — R404 Transient alteration of awareness: Secondary | ICD-10-CM | POA: Diagnosis not present

## 2024-07-05 DIAGNOSIS — J9601 Acute respiratory failure with hypoxia: Secondary | ICD-10-CM | POA: Diagnosis not present

## 2024-07-05 DIAGNOSIS — E43 Unspecified severe protein-calorie malnutrition: Secondary | ICD-10-CM | POA: Diagnosis not present

## 2024-07-05 DIAGNOSIS — R579 Shock, unspecified: Secondary | ICD-10-CM | POA: Diagnosis not present

## 2024-07-05 LAB — BASIC METABOLIC PANEL WITH GFR
Anion gap: 16 — ABNORMAL HIGH (ref 5–15)
BUN: 41 mg/dL — ABNORMAL HIGH (ref 8–23)
CO2: 22 mmol/L (ref 22–32)
Calcium: 7.4 mg/dL — ABNORMAL LOW (ref 8.9–10.3)
Chloride: 109 mmol/L (ref 98–111)
Creatinine, Ser: 2.42 mg/dL — ABNORMAL HIGH (ref 0.61–1.24)
GFR, Estimated: 26 mL/min — ABNORMAL LOW (ref >60)
Glucose, Bld: 236 mg/dL — ABNORMAL HIGH (ref 70–99)
Potassium: 4 mmol/L (ref 3.5–5.1)
Sodium: 147 mmol/L — ABNORMAL HIGH (ref 135–145)

## 2024-07-05 LAB — CBC
HCT: 25.8 % — ABNORMAL LOW (ref 39.0–52.0)
Hemoglobin: 7.8 g/dL — ABNORMAL LOW (ref 13.0–17.0)
MCH: 31 pg (ref 26.0–34.0)
MCHC: 30.2 g/dL (ref 30.0–36.0)
MCV: 102.4 fL — ABNORMAL HIGH (ref 80.0–100.0)
Platelets: 214 K/uL (ref 150–400)
RBC: 2.52 MIL/uL — ABNORMAL LOW (ref 4.22–5.81)
RDW: 19.7 % — ABNORMAL HIGH (ref 11.5–15.5)
WBC: 11 K/uL — ABNORMAL HIGH (ref 4.0–10.5)
nRBC: 0.2 % (ref 0.0–0.2)

## 2024-07-05 LAB — GLUCOSE, CAPILLARY
Glucose-Capillary: 205 mg/dL — ABNORMAL HIGH (ref 70–99)
Glucose-Capillary: 205 mg/dL — ABNORMAL HIGH (ref 70–99)

## 2024-07-05 MED ORDER — BIOTENE DRY MOUTH MT LIQD: 15.0000 mL | Freq: Two times a day (BID) | OROMUCOSAL | Status: DC

## 2024-07-05 MED ORDER — GLYCOPYRROLATE 1 MG PO TABS: 1.0000 mg | ORAL_TABLET | ORAL | Status: DC | PRN

## 2024-07-05 MED ORDER — ACETAMINOPHEN 325 MG PO TABS: 650.0000 mg | ORAL_TABLET | Freq: Four times a day (QID) | ORAL | Status: DC | PRN

## 2024-07-05 MED ORDER — LORAZEPAM 2 MG/ML IJ SOLN: 1.0000 mg | INTRAMUSCULAR | Status: DC | PRN

## 2024-07-05 MED ORDER — LORAZEPAM 2 MG/ML PO CONC: 1.0000 mg | ORAL | Status: DC | PRN

## 2024-07-05 MED ORDER — LORAZEPAM 1 MG PO TABS: 1.0000 mg | ORAL_TABLET | ORAL | Status: DC | PRN

## 2024-07-05 MED ORDER — ONDANSETRON 4 MG PO TBDP: 4.0000 mg | ORAL_TABLET | Freq: Four times a day (QID) | ORAL | Status: DC | PRN

## 2024-07-05 MED ORDER — ONDANSETRON HCL 4 MG/2ML IJ SOLN: 4.0000 mg | Freq: Four times a day (QID) | INTRAMUSCULAR | Status: DC | PRN

## 2024-07-05 MED ORDER — DIPHENHYDRAMINE HCL 50 MG/ML IJ SOLN: 25.0000 mg | INTRAMUSCULAR | Status: DC | PRN

## 2024-07-05 MED ORDER — HALOPERIDOL 1 MG PO TABS: 2.0000 mg | ORAL_TABLET | Freq: Four times a day (QID) | ORAL | Status: DC | PRN

## 2024-07-05 MED ORDER — HYDROMORPHONE HCL 1 MG/ML IJ SOLN
0.5000 mg | Freq: Four times a day (QID) | INTRAMUSCULAR | Status: DC
  Filled 2024-07-05: qty 0.5

## 2024-07-05 MED ORDER — ACETAMINOPHEN 650 MG RE SUPP: 650.0000 mg | Freq: Four times a day (QID) | RECTAL | Status: DC | PRN

## 2024-07-05 MED ORDER — POLYVINYL ALCOHOL 1.4 % OP SOLN: 1.0000 [drp] | Freq: Four times a day (QID) | OPHTHALMIC | Status: DC | PRN

## 2024-07-05 MED ORDER — HALOPERIDOL LACTATE 2 MG/ML PO CONC: 2.0000 mg | Freq: Four times a day (QID) | ORAL | Status: DC | PRN

## 2024-07-05 MED ORDER — FREE WATER: 300.0000 mL | Freq: Four times a day (QID) | Status: DC

## 2024-07-05 MED ORDER — HYDROMORPHONE HCL 1 MG/ML IJ SOLN: 0.5000 mg | Freq: Four times a day (QID) | INTRAMUSCULAR | Status: DC

## 2024-07-05 MED ORDER — HALOPERIDOL LACTATE 5 MG/ML IJ SOLN: 2.0000 mg | Freq: Four times a day (QID) | INTRAMUSCULAR | Status: DC | PRN

## 2024-07-05 MED ORDER — HYDROMORPHONE HCL 1 MG/ML IJ SOLN
0.5000 mg | INTRAMUSCULAR | Status: DC | PRN
  Administered 2024-07-05: 0.5 mg via INTRAVENOUS

## 2024-07-05 MED ORDER — GLYCOPYRROLATE 0.2 MG/ML IJ SOLN: 0.2000 mg | INTRAMUSCULAR | Status: DC | PRN

## 2024-07-05 MED ORDER — HYDROMORPHONE HCL 1 MG/ML IJ SOLN: 0.5000 mg | INTRAMUSCULAR | Status: DC | PRN

## 2024-07-05 NOTE — Consult Note (Signed)
 Consultation Note Date: 07/05/2024   Patient Name: Jake Bartlett  DOB: 1941-06-28  MRN: [id number]  Age / Sex: 83 y.o., male  PCP: Alyse Bradley, MD Referring Physician: Raenelle Donalda HERO, MD  Reason for Consultation: Establishing goals of care  HPI/Patient Profile: 83 y.o. 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 Hospital course was complicated by delirium. Patient was extubated on 9/9. Coretrak was place on 9/10. Patient is currently on day 8 of hospitalization without significant improvement and ongoing high risk for decompensation.   Of note, patient has been admitted 2 times in the last 6 months. He is a 7 day readmission.  Clinical Assessment and Goals of Care: I have reviewed medical records including EPIC notes, labs, any available advanced directives, and imaging. Received report from primary RN - no acute concerns. RN reports patient is confused, weak, and bedbound.   Went to visit patient at bedside - no family/visitors present. Patient was lying in bed with eyes closed - he opens his eyes to voice/gentle touch. When I ask if he can tell me his name, he shakes his head no. He does not answer any other questions. Signs and non-verbal gestures of discomfort noted. No respiratory distress, increased work of breathing, or secretions noted. RR was 32. He is ill appearing with  mitts and coretrak in place.  Met with wife/[name], son/Tyler, and DIL/Leigh via speakerphone  to discuss diagnosis, prognosis, GOC, EOL wishes, disposition, and options.  I introduced Palliative Medicine as specialized medical care for people living with serious illness. It focuses on providing relief from the symptoms and stress of a serious illness. The goal is to improve quality of life for both the patient and the family.  We discussed a brief life review of the patient as well as functional and nutritional status. Patient is married with two sons. Prior to hospitalization, he was living in a private residence with his wife as of June 3rd. On this date, he was hospitalized and shortly after underwent surgical hip repair. He was discharged to [facility] for rehab. Family share that patient did not regain any strength and was discharged home very weak/unable to get up on his own/needing max assist from Tyler/son. Subsequently, he was hospitalized again where CT showed cecal mass. Laparascopic right hemicolectomy was performed 06/22/24. He was discharged back to [facility] for STR where shortly after was found unresponsive and rehospitalized (current admission). Therapeutic listening provided as family reflect on how difficult the last several months have been for the patient and how they have recognized his progressive decline.  We discussed patient's current illness and what it means in the larger context of patient's on-going co-morbidities. Family have a clear understanding of patient's current acute medical situation. Natural disease trajectory and expectations at EOL were discussed. I attempted to elicit values and goals of care important to the patient. The difference between aggressive medical intervention and comfort care was considered in light of the patient's goals of care.   Family express interest in comfort care. We talked about transition to comfort measures in house and  what that would  entail inclusive of medications to control pain, dyspnea, agitation, nausea, and itching. We discussed stopping all unnecessary measures such as coretrak/artificial feeding, blood draws, needle sticks, oxygen, antibiotics, CBGs/insulin , cardiac monitoring, IVF, and frequent vital signs. Education provided that other non-pharmacological interventions would be utilized for holistic support and comfort such as spiritual support if requested, repositioning, music therapy, offering comfort feeds, and/or therapeutic listening. All care would focus on how the patient is looking and feeling. Family state they are ready to let nature take it's course and start comfort care today. They want to cease life prolonging measures.   Provided education and counseling at length on the philosophy and benefits of hospice care. Discussed that it offers a holistic approach to care in the setting of end-stage illness, and is about supporting the patient where they are allowing nature to take it's course. Discussed the hospice team includes RNs, physicians, social workers, and chaplains. They can provide personal care, support for the family, and help keep patient out of the hospital as well as assist with DME needs for home hospice. Education provided on the difference between home vs residential hospice. Provided reassurance that residential hospice referral could be cancelled (would anticipate hospital death) at any time if patient's condition changed and it was felt they were too unstable for transfer. Wife/[name] volunteered with [facility] in [place] several years ago and is interested in patient's transfer to this facility; however, if at any point he is unstable for transfer (high risk of passing away in transport) family are ok with hospital death.   Visit also consisted of discussions dealing with the complex and emotionally intense issues of symptom management and palliative care in the setting of  serious and life-threatening illness. Family are agreeable to symptom management plan of scheduling opioid with PRN doses for breakthrough symptoms.  Discussed with family the importance of continued conversation with each other and the medical providers regarding overall plan of care and treatment options, ensuring decisions are within the context of the patient's values and GOCs.    Questions and concerns were addressed. The patient/family was encouraged to call with questions and/or concerns. PMT number was provided.   Primary Decision Maker: NEXT OF KIN - wife/[name] Boeke    SUMMARY OF RECOMMENDATIONS   Initiated full comfort measures Now DNR-comfort - durable DNR form completed and placed in shadow chart. Copy was made and will be scanned into Vynca/ACP tab Family are interested in inpatient hospice transfer, requesting [facility] in [place] [facility] consult placed; TOC and hospice liaison notified Continue palliative wound care Added orders for EOL symptom management and to reflect full comfort measures, as well as discontinued orders that were not focused on comfort Unrestricted visitation orders were placed per current [place] EOL visitation policy  Nursing to provide frequent assessments and administer PRN medications as clinically necessary to ensure EOL comfort PMT will continue to follow and support holistically  Symptom Management Scheduled dilaudid  q6h; PRN doses for breakthrough pain/dyspnea/increased work of breathing/RR>25 Tylenol  PRN pain/fever Biotin twice daily Benadryl  PRN itching Robinul  PRN secretions Haldol  PRN agitation/delirium Ativan  PRN anxiety/seizure/sleep/distress Zofran  PRN nausea/vomiting Liquifilm Tears PRN dry eye    Code Status/Advance Care Planning: DNR  Palliative Prophylaxis:  Aspiration, Frequent Pain Assessment, Oral Care, and Turn Reposition  Additional Recommendations (Limitations, Scope, Preferences): Full Comfort  Care  Psycho-social/Spiritual:  Desire for further Chaplaincy support:no Created space and opportunity for patient and family to express thoughts  and feelings regarding patient's current medical situation.  Emotional support and therapeutic listening provided.  Prognosis:  < 2 weeks  Discharge Planning: Hospice facility      Primary Diagnoses: Present on Admission:  [facility])   I have reviewed the medical record, interviewed the patient and family, and examined the patient. The following aspects are pertinent.  Past Medical History:  Diagnosis Date   Diabetes mellitus without complication (HCC)    High cholesterol    Stroke [facility])    Social History   Socioeconomic History   Marital status: Married    Spouse name: Not on file   Number of children: Not on file   Years of education: Not on file   Highest education level: Not on file  Occupational History   Not on file  Tobacco Use   Smoking status: Never    Passive exposure: Never   Smokeless tobacco: Never  Vaping Use   Vaping status: Never Used  Substance and Sexual Activity   Alcohol  use: No   Drug use: No   Sexual activity: Not on file  Other Topics Concern   Not on file  Social History Narrative   Not on file   Social Drivers of Health   Financial Resource Strain: Low Risk  (12/05/2023)   Received from [facility]   Overall Financial Resource Strain (CARDIA)    Difficulty of Paying Living Expenses: Not very hard  Food Insecurity: Patient Unable To Answer (06/30/2024)   Hunger Vital Sign    Worried About Running Out of Food in the Last Year: Patient unable to answer    Ran Out of Food in the Last Year: Patient unable to answer  Transportation Needs: Patient Unable To Answer (06/30/2024)   PRAPARE - Transportation    Lack of Transportation (Medical): Patient unable to answer    Lack of Transportation (Non-Medical): Patient unable to answer  Physical Activity: Insufficiently Active (06/24/2023)    Received from [facility]   Exercise Vital Sign    On average, how many days per week do you engage in moderate to strenuous exercise (like a brisk walk)?: 2 days    On average, how many minutes do you engage in exercise at this level?: 20 min  Stress: No Stress Concern Present (06/24/2023)   Received from [facility] - Occupational Stress Questionnaire    Feeling of Stress : Not at all  Social Connections: Patient Unable To Answer (06/30/2024)   Social Connection and Isolation Panel    Frequency of Communication with Friends and Family: Patient unable to answer    Frequency of Social Gatherings with Friends and Family: Patient unable to answer    Attends Religious Services: Patient unable to answer    Active Member of Clubs or Organizations: Patient unable to answer    Attends Banker Meetings: Patient unable to answer    Marital Status: Patient unable to answer   No family history on file. Scheduled Meds:  carbidopa -levodopa   1 tablet Per Tube Q8H   Chlorhexidine  Gluconate Cloth  6 each Topical Daily   escitalopram   10 mg Per Tube Daily   feeding supplement (PROSource TF20)  60 mL Per Tube Daily   free water   300 mL Per Tube Q6H   heparin  injection (subcutaneous)  5,000 Units Subcutaneous Q8H   insulin  aspart  0-20 Units Subcutaneous Q4H   leptospermum manuka honey  1 Application Topical Daily   midodrine   10 mg  Per Tube TID WC   multivitamin with minerals  1 tablet Per Tube Daily   pramipexole   0.25 mg Per Tube QHS   sodium bicarbonate   650 mg Per Tube TID   thiamine  (VITAMIN B1) injection  100 mg Intravenous Daily   Continuous Infusions:  feeding supplement (VITAL 1.5 CAL) 1,000 mL (07/04/24 1500)   piperacillin -tazobactam (ZOSYN )  IV 3.375 g (07/05/24 0510)   PRN Meds:.mouth rinse Medications Prior to Admission:  Prior to Admission medications   Medication Sig Start Date End Date Taking? Authorizing Provider   acetaminophen  (TYLENOL ) 500 MG tablet Take 1,000 mg by mouth every 8 (eight) hours as needed for mild pain (pain score 1-3).   Yes [provider]  B Complex Vitamins (VITAMIN B COMPLEX) TABS Take 1 tablet by mouth daily.   Yes [provider]  carbidopa -levodopa  (SINEMET  IR) 25-250 MG tablet Take 1 tablet by mouth in the morning, at noon, and at bedtime. 01/09/22  Yes [provider]  carvedilol (COREG) 3.125 MG tablet Take 3.125 mg by mouth in the morning.   Yes [provider]  Cholecalciferol  (VITAMIN D3) 10 MCG (400 UNIT) tablet Take 400 Units by mouth daily.   Yes [provider]  escitalopram  (LEXAPRO ) 10 MG tablet Take 10 mg by mouth daily.   Yes [provider]  folic acid  (FOLVITE ) 1 MG tablet Take 1 mg by mouth daily. 11/23/21  Yes [provider]  Multiple Vitamin (MULTIVITAMIN) tablet Take 1 tablet by mouth daily.   Yes [provider]  NOVOLIN 70/30 KWIKPEN (70-30) 100 UNIT/ML KwikPen Inject 10-20 Units into the skin See admin instructions. Inject 20 units into the skin every morning and inject 10 units at bedtime.   Yes [provider]  oxyCODONE  (OXY IR/ROXICODONE ) 5 MG immediate release tablet Take 1 tablet (5 mg total) by mouth every 4 (four) hours as needed for moderate pain (pain score 4-6). 06/25/24  Yes Adhikari, Ivonne, MD  pramipexole  (MIRAPEX ) 0.25 MG tablet Take 0.25 mg by mouth at bedtime.   Yes [provider]  rosuvastatin  (CRESTOR ) 10 MG tablet Take 10 mg by mouth at bedtime. 01/16/22  Yes [provider]  sodium bicarbonate  650 MG tablet Take 1 tablet (650 mg total) by mouth 3 (three) times daily. 06/25/24  Yes Jillian Ivonne, MD  tamsulosin  (FLOMAX ) 0.4 MG CAPS capsule Take 1 capsule (0.4 mg total) by mouth daily. 02/24/22  Yes Towana Ozell BROCKS, MD  feeding supplement (ENSURE PLUS HIGH PROTEIN) LIQD Take 237 mLs by mouth 2 (two) times daily between meals. Patient not taking:  Reported on 06/28/2024 06/25/24   Adhikari, Amrit, MD  Nystatin (GERHARDT'S BUTT CREAM) CREA Apply 1 Application topically 3 (three) times daily. Patient not taking: Reported on 06/28/2024 06/25/24   Jillian Ivonne, MD   Allergies  Allergen Reactions   Ropinirole Anaphylaxis and Swelling    Angioedema   Neurontin [Gabapentin] Other (See Comments)    Drowsiness   Review of Systems  Unable to perform ROS: Acuity of condition    Physical Exam Vitals and nursing note reviewed.  Constitutional:      General: He is not in acute distress.    Appearance: He is ill-appearing.  Pulmonary:     Effort: Tachypnea present. No respiratory distress.  Skin:    General: Skin is warm and dry.  Neurological:     Mental Status: He is lethargic.     Motor: Weakness present.     Vital Signs: BP ROLLEN)  101/54 (BP Location: Left Arm)   Pulse 87   Temp 97.6 F (36.4 C) (Axillary)   Resp 19   Wt 79.4 kg   SpO2 100%   BMI 26.62 kg/m  Pain Scale: FLACC   Pain Score: 0-No pain   SpO2: SpO2: 100 % O2 Device:SpO2: 100 % O2 Flow Rate: .O2 Flow Rate (L/min): 2 L/min  IO: Intake/output summary:  Intake/Output Summary (Last 24 hours) at 07/05/2024 1021 Last data filed at 07/05/2024 0508 Gross per 24 hour  Intake 800 ml  Output 1900 ml  Net -1100 ml    LBM: Last BM Date : 07/03/24 Baseline Weight: Weight: 83 kg Most recent weight: Weight: 79.4 kg     Palliative Assessment/Data: PPS 10%     Time In: 1015 Time Out: 1130 Time Total: 75 minutes  Signed by: Jeoffrey CHRISTELLA Sharps, NP   Please contact Palliative Medicine Team phone at [phone] for questions and concerns.  For individual provider: See Amion  *Portions of this note are a verbal dictation therefore any spelling and/or grammatical errors are due to the Dragon Medical One system interpretation.

## 2024-07-05 NOTE — Plan of Care (Signed)
  Problem: Coping: Goal: Ability to adjust to condition or change in health will improve Outcome: Progressing   Problem: Fluid Volume: Goal: Ability to maintain a balanced intake and output will improve Outcome: Progressing   Problem: Health Behavior/Discharge Planning: Goal: Ability to manage health-related needs will improve Outcome: Progressing   Problem: Nutritional: Goal: Maintenance of adequate nutrition will improve Outcome: Progressing   Problem: Skin Integrity: Goal: Risk for impaired skin integrity will decrease Outcome: Progressing   Problem: Tissue Perfusion: Goal: Adequacy of tissue perfusion will improve Outcome: Progressing   Problem: Activity: Goal: Ability to tolerate increased activity will improve Outcome: Progressing   Problem: Education: Goal: Knowledge of General Education information will improve Description: Including pain rating scale, medication(s)/side effects and non-pharmacologic comfort measures Outcome: Progressing

## 2024-07-05 NOTE — Progress Notes (Signed)
 PROGRESS NOTE        PATIENT DETAILS Name: Jake Bartlett Age: 83 y.o. Sex: male Date of Birth: Oct 03, 1941 Admit Date: 06/27/2024 Admitting Physician Sammi Fredericks, MD ERE:Bajwzs, Slater, MD  Brief Summary: Patient is a 83 y.o.  male with history of CKD 4, DM-2, CVA, Parkinson's disease-who was recently hospitalized from 8/27-9/5 for UTI-he was ultimately found to have cecal mass-underwent right hemicolectomy (pathology confirmed adenocarcinoma)-brought to the hospital on 9/7 for altered mental status-he was hypoglycemic with EMS-after correction of hypoglycemia-his mental status did not improve-he was subsequently intubated and admitted to the ICU service.  He was ultimately found to have septic shock in the setting of multifocal pneumonia-he was stabilized/extubated and then transferred to [facility].  See below for further details.  Significant events: 9/07>> admitted with septic shock likely due to aspiration pneumonia on vasopressor support  9/08>> central line placed for ongoing pressor support, tube feeds initiated 9/09>>extubated 9/10>> patient off pressors, unable to tolerate swallow, coretrack placed 9/12>> transferred to [facility]  Significant studies: 9/07>> chest x-ray: Dense left retrocardiac opacity-atelectasis versus aspiration pneumonia. 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 9/08>> MRI brain: No acute intracranial abnormality.  Significant microbiology data: 9/07>> blood culture: No growth 9/08>> tracheal aspirate: Moderate Candida albicans-likely colonization. 9/09>> urine culture: No growth  Procedures: See above  Consults: PCCM Neurology  Subjective: No major issues overnight-appears unchanged.  Awakes-answer simple questions appropriately and then goes back to  sleep.  Objective: Vitals: Blood pressure (!) 101/54, pulse 87, temperature 97.6 F (36.4 C), temperature source Axillary, resp. rate 19, weight 79.4 kg, SpO2 100%.   Exam: Lethargic but easily arousable-very frail/deconditioned-not in any distress Chest: Clear to auscultation anteriorly CVS: S1-S2 regular Abdomen: Soft nontender nondistended Neurology: Severe diffuse generalized weakness but nonfocal. Extremities:+ Edema.  Pertinent Labs/Radiology:    Latest Ref Rng & Units 07/05/2024    4:40 AM 07/04/2024    3:45 AM 07/03/2024    3:59 PM  CBC  WBC 4.0 - 10.5 K/uL 11.0  10.5  11.0   Hemoglobin 13.0 - 17.0 g/dL 7.8  7.8  7.3   Hematocrit 39.0 - 52.0 % 25.8  24.6  23.2   Platelets 150 - 400 K/uL 214  266  289     Lab Results  Component Value Date   NA 147 (H) 07/05/2024   K 4.0 07/05/2024   CL 109 07/05/2024   CO2 22 07/05/2024      Assessment/Plan: Acute hypoxic respiratory failure and septic shock secondary to aspiration pneumonia Sepsis physiology has resolved Stable on just 2 L of oxygen On Zosyn -per prior PCCM note-10 days planned. Incentive spirometry/flutter valve/mobilize and attempt to titrate down oxygen as tolerated  Acute metabolic encephalopathy Secondary to sepsis physiology/hypoxia Although overall improved-mostly sleepy/lethargic-with intermittent waxing/waning hospital delirium.   Maintain delirium precautions  CKD 4 Close to baseline  Normocytic anemia Secondary to critical illness superimposed on anemia of CKD No evidence of blood loss Monitor CBC and transfuse if significant drop.  DM-2 (A1c 5.8 on 8/27) Apparently hypoglycemic on initial presentation CBGs now stable for the past several days. Allow permissive hyperglycemia. Maintain on SSI.  Recent Labs    07/04/24 2321 07/05/24 0408 07/05/24 0732  GLUCAP 204* 205* 205*    HTN  BP soft but stable Continue midodrine .  Oropharyngeal dysphagia Likely secondary to  deconditioning/weakness/critical illness N.p.o.-NG tube in place SLP following-await further recommendations-unfortunately remains mostly sleepy lethargic  Hypernatremia Increase free water  through NG tube Repeat electrolytes tomorrow  Hypokalemia Repleted.  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  Palliative care/advance directives DNR in place Spoke with spouse on 9/14-she and her children have been discussing end-of-life issues -given advanced age/frailty and his current situation with encephalopathy/weakness.  We discussed options of transitioning to hospice care or giving it another 1-2 days to see if he will improve further before we make any changes.  We discussed that even if he survives this hospitalization-it is unlikely that he will revert back to his prior functionality.  I encouraged her to keep discussing with her children-and to continue to think about it-in the interim-have consulted palliative care-await recommendations.  Nutrition Status: Nutrition Problem: Severe Malnutrition Etiology: acute illness Signs/Symptoms: percent weight loss, moderate muscle depletion Percent weight loss: 12.4 % Interventions: MVI, Prostat, Tube feeding  Class 2 Obesity: Estimated body mass index is 26.62 kg/m as calculated from the following:   Height as of 06/22/24: 5' 8 (1.727 m).   Weight as of this encounter: 79.4 kg.   Wound care/pressure ulcer Note following wounds present prior to hospitalization Agree with  assessment and plan per wound care RN. Wound type:  1.  R heel Unstageable Pressure Injury 80% black eschar 20% pink  2.  L heel Deep Tissue Pressure Injury purple maroon discoloration  3.  Full thickness R posterior leg pink  4. Partial thickness mid back pink  5.  Deep Tissue Pressure Injury sacrum purple maroon discoloration  6.  Partial thickness skin loss B shins pink moist, some scabbed 7. Stage 3 Pressure Injury R ankle/lower anterior leg 50% pink 50% yellow (from old traction)  8.  Midline abdomen healing surgical incision   Pressure Injury POA: Yes B heels and sacrum/ankle  Dressing procedure/placement/frequency:  Cleanse R heel with Vashe wound cleanser Soila [phone]) do not rinse and allow to air dry. Apply Medihoney to wound bed daily, cover with dry gauze and secure with Kerlix roll gauze.  Cleanse L heel with soap and water , dry and apply Xeroform gauze Soila [phone]) to purple discoloration daily.  Secure with silicone foam. Place B feet in Prevalon boots to offload pressure.  Cleanse sacrum/buttocks with soap and water , dry and apply Xeroform gauze Soila [phone]) to purple discoloration daily. Secure with silicone foam.  Cleanse R posterior leg, mid back and B shin wounds with Vashe, cover with Xeroform gauze and secure with silicone foam.  Cleanse R ankle/lower leg wound with Vashe, using a Q tip applicator apply silver hydrofiber Soila [phone] Aquacel AG) to wound bed daily and secure with silicone foam.      Code status:   Code Status: Limited: Do not attempt resuscitation (DNR) -DNR-LIMITED -Do Not Intubate/DNI    DVT Prophylaxis: heparin  injection 5,000 Units Start: 06/28/24 0900 SCDs Start: 06/27/24 1630   Family Communication: Spouse-[name] Mauceri-[phone] updated at bedside on 9/14.   Disposition Plan: Status is:  Inpatient Remains inpatient appropriate because: Severity of illness   Planned Discharge Destination:Skilled nursing  facility   Diet: Diet Order             Diet NPO time specified  Diet effective now                     Antimicrobial agents: Anti-infectives (From admission, onward)    Start     Dose/Rate Route Frequency Ordered Stop   06/29/24 1700  vancomycin  (VANCOREADY) IVPB 1250 mg/250 mL  Status:  Discontinued        1,250 mg 166.7 mL/hr over 90 Minutes Intravenous Every 48 hours 06/27/24 1751 06/28/24 0801   06/27/24 1845  piperacillin -tazobactam (ZOSYN ) IVPB 3.375 g        3.375 g 12.5 mL/hr over 240 Minutes Intravenous Every 8 hours 06/27/24 1749 07/06/24 2359   06/27/24 1600  vancomycin  (VANCOREADY) IVPB 1500 mg/300 mL        1,500 mg 150 mL/hr over 120 Minutes Intravenous  Once 06/27/24 1555 06/27/24 2025   06/27/24 1545  ceFEPIme (MAXIPIME) 2 g in sodium chloride  0.9 % 100 mL IVPB  Status:  Discontinued        2 g 200 mL/hr over 30 Minutes Intravenous  Once 06/27/24 1542 06/27/24 1749   06/27/24 1545  metroNIDAZOLE  (FLAGYL ) IVPB 500 mg  Status:  Discontinued        500 mg 100 mL/hr over 60 Minutes Intravenous  Once 06/27/24 1542 06/27/24 1749   06/27/24 1545  vancomycin  (VANCOCIN ) IVPB 1000 mg/200 mL premix  Status:  Discontinued        1,000 mg 200 mL/hr over 60 Minutes Intravenous  Once 06/27/24 1542 06/27/24 1555        MEDICATIONS: Scheduled Meds:  carbidopa -levodopa   1 tablet Per Tube Q8H   Chlorhexidine  Gluconate Cloth  6 each Topical Daily   escitalopram   10 mg Per Tube Daily   feeding supplement (PROSource TF20)  60 mL Per Tube Daily   free water   300 mL Per Tube Q6H   heparin  injection (subcutaneous)  5,000 Units Subcutaneous Q8H   insulin  aspart  0-20 Units Subcutaneous Q4H   leptospermum manuka honey  1 Application Topical Daily   midodrine   10 mg Per Tube TID WC   multivitamin with minerals  1 tablet Per Tube Daily   pramipexole   0.25 mg Per Tube QHS   sodium bicarbonate   650 mg Per Tube TID   thiamine  (VITAMIN B1) injection  100 mg Intravenous Daily    Continuous Infusions:  feeding supplement (VITAL 1.5 CAL) 1,000 mL (07/04/24 1500)   piperacillin -tazobactam (ZOSYN )  IV 3.375 g (07/05/24 0510)   PRN Meds:.mouth rinse   I have personally reviewed following labs and imaging studies  LABORATORY DATA: CBC: Recent Labs  Lab 06/30/24 0450 06/30/24 1310 07/03/24 1559 07/04/24 0345 07/05/24 0440  WBC 13.5* 11.3* 11.0* 10.5 11.0*  HGB 7.4* 7.8* 7.3* 7.8* 7.8*  HCT 23.4* 24.6* 23.2* 24.6* 25.8*  MCV 96.7 97.2 96.7 98.0 102.4*  PLT 460* 389 289 266 214    Basic Metabolic Panel: Recent Labs  Lab 06/29/24 0240 06/30/24 0450 06/30/24 1310 07/01/24 0345 07/01/24 0959 07/02/24 0352 07/02/24 2028 07/03/24 0407 07/04/24 0345 07/05/24 0440  NA 136  --    < > 139   < > 143 145 145 147* 147*  K 4.1 3.4*   < > 3.1*   < > 3.3* 3.6 3.6 3.2* 4.0  CL 108  --    < >  103   < > 106 106 106 105 109  CO2 17*  --    < > 22   < > 24 23 25 26 22   GLUCOSE 246*  --    < > 187*   < > 217* 208* 220* 170* 236*  BUN 29*  --    < > 33*   < > 37* 40* 39* 42* 41*  CREATININE 2.82*  --    < > 2.80*   < > 2.90* 2.86* 2.70* 2.61* 2.42*  CALCIUM  7.0*  --    < > 7.4*   < > 7.3* 7.6* 7.5* 7.5* 7.4*  MG 1.6* 2.0  --  2.1  --   --  1.9  --   --   --   PHOS 3.5 3.0  --  3.1  --   --   --   --   --   --    < > = values in this interval not displayed.    GFR: Estimated Creatinine Clearance: 22 mL/min (A) (by C-G formula based on SCr of 2.42 mg/dL (H)).  Liver Function Tests: No results for input(s): AST, ALT, ALKPHOS, BILITOT, PROT, ALBUMIN  in the last 168 hours.  No results for input(s): LIPASE, AMYLASE in the last 168 hours. No results for input(s): AMMONIA in the last 168 hours.   Coagulation Profile: No results for input(s): INR, PROTIME in the last 168 hours.  Cardiac Enzymes: No results for input(s): CKTOTAL, CKMB, CKMBINDEX, TROPONINI in the last 168 hours.  BNP (last 3 results) No results for input(s):  PROBNP in the last 8760 hours.  Lipid Profile: No results for input(s): CHOL, HDL, LDLCALC, TRIG, CHOLHDL, LDLDIRECT in the last 72 hours.  Thyroid Function Tests: No results for input(s): TSH, T4TOTAL, FREET4, T3FREE, THYROIDAB in the last 72 hours.  Anemia Panel: No results for input(s): VITAMINB12, FOLATE, FERRITIN, TIBC, IRON , RETICCTPCT in the last 72 hours.  Urine analysis:    Component Value Date/Time   COLORURINE YELLOW 06/29/2024 2307   APPEARANCEUR HAZY (A) 06/29/2024 2307   LABSPEC 1.028 06/29/2024 2307   PHURINE 5.0 06/29/2024 2307   GLUCOSEU 50 (A) 06/29/2024 2307   HGBUR SMALL (A) 06/29/2024 2307   BILIRUBINUR NEGATIVE 06/29/2024 2307   KETONESUR NEGATIVE 06/29/2024 2307   PROTEINUR 100 (A) 06/29/2024 2307   NITRITE NEGATIVE 06/29/2024 2307   LEUKOCYTESUR LARGE (A) 06/29/2024 2307    Sepsis Labs: Lactic Acid, Venous    Component Value Date/Time   LATICACIDVEN 1.2 06/27/2024 1501    MICROBIOLOGY: Recent Results (from the past 240 hours)  Blood Culture (routine x 2)     Status: None   Collection Time: 06/27/24  3:40 PM   Specimen: BLOOD  Result Value Ref Range Status   Specimen Description BLOOD LEFT ANTECUBITAL  Final   Special Requests   Final    BOTTLES DRAWN AEROBIC AND ANAEROBIC Blood Culture results may not be optimal due to an inadequate volume of blood received in culture bottles   Culture   Final    NO GROWTH 5 DAYS Performed at [facility], [address] [place], [place] [address]    Report Status 07/02/2024 FINAL  Final  MRSA Next Gen by PCR, Nasal     Status: None   Collection Time: 06/27/24  4:30 PM   Specimen: Nasal Mucosa; Nasal Swab  Result Value Ref Range Status   MRSA by PCR Next Gen NOT DETECTED NOT DETECTED Final  Comment: (NOTE) The GeneXpert MRSA Assay (FDA approved for NASAL specimens only), is one component of a comprehensive MRSA colonization surveillance program. It is not  intended to diagnose MRSA infection nor to guide or monitor treatment for MRSA infections. Test performance is not FDA approved in patients less than 68 years old. Performed at [facility], [address] [place], [place] [address]   Blood Culture (routine x 2)     Status: None   Collection Time: 06/27/24  6:34 PM   Specimen: BLOOD RIGHT HAND  Result Value Ref Range Status   Specimen Description BLOOD RIGHT HAND  Final   Special Requests   Final    BOTTLES DRAWN AEROBIC ONLY Blood Culture results may not be optimal due to an inadequate volume of blood received in culture bottles   Culture   Final    NO GROWTH 5 DAYS Performed at [facility], [address], [place], [place] [address]    Report Status 07/02/2024 FINAL  Final  Culture, Respiratory w Gram Stain     Status: None   Collection Time: 06/28/24  8:00 AM   Specimen: Tracheal Aspirate; Respiratory  Result Value Ref Range Status   Specimen Description TRACHEAL ASPIRATE  Final   Special Requests NONE  Final   Gram Stain   Final    MODERATE WBC PRESENT, PREDOMINANTLY PMN FEW SQUAMOUS EPITHELIAL CELLS PRESENT MODERATE YEAST FEW GRAM POSITIVE RODS Performed at [facility], [address], [place], [place] [address]    Culture MODERATE CANDIDA ALBICANS  Final   Report Status 07/01/2024 FINAL  Final  Urine Culture     Status: None   Collection Time: 06/29/24 11:07 PM   Specimen: Urine, Random  Result Value Ref Range Status   Specimen Description URINE, RANDOM  Final   Special Requests NONE Reflexed from [id number]  Final   Culture   Final    NO GROWTH Performed at [facility], [address] [place], [place] [address]    Report Status 06/30/2024 FINAL  Final    RADIOLOGY STUDIES/RESULTS: No results found.    LOS: 8 days   Donalda Applebaum, MD  [facility]    To contact the attending provider between 7A-7P or the covering provider during after hours 7P-7A, please log into the web  site [email or web address] and access using universal [place] password for that web site. If you do not have the password, please call the hospital operator.  07/05/2024, 10:23 AM

## 2024-07-05 NOTE — TOC Transition Note (Signed)
 Transition of Care [facility]) - Discharge Note   Patient Details  Name: Jake Bartlett MRN: [id number] Date of Birth: 1941/10/05  [facility] CM/SW Contact:  Inocente GORMAN Kindle, LCSW Phone Number: 07/05/2024, 2:34 PM   Clinical Narrative:    [facility] Anticipated DC date: 07/05/24 Family notified: Spouse Transport by: [facility]   Per MD patient ready for DC to Hospice. RN to call report prior to discharge [phone]). RN, patient, patient's family, and facility notified of DC. Discharge Summary sent to facility. DC packet on chart including signed DNR. Ambulance transport requested for patient.   CSW will sign off for now as social work intervention is no longer needed. Please consult us  again if new needs arise.     Final next level of care: Hospice Medical Facility Barriers to Discharge: Barriers Resolved   Patient Goals and CMS Choice Patient states their goals for this hospitalization and ongoing recovery are:: Comfort CMS Medicare.gov Compare Post Acute Care list provided to:: Patient Represenative (must comment) Choice offered to / list presented to : Spouse [place] ownership interest in [facility].provided to:: Spouse    Discharge Placement              Patient [facility] bed at:  [facility]) Patient to be transferred to facility by: [facility] Name of family member notified: Spouse Patient and family notified of of transfer: 07/05/24  Discharge Plan and Services Additional resources added to the After Visit Summary for   In-house Referral: Clinical Social Work, Hospice / Palliative Care   Post Acute Care Choice: Residential Hospice Bed                               Social Drivers of Health (SDOH) Interventions SDOH Screenings   Food Insecurity: Patient Unable To Answer (06/30/2024)  Housing: Patient Unable To Answer (06/30/2024)  Transportation Needs: Patient Unable To  Answer (06/30/2024)  Utilities: Patient Unable To Answer (06/30/2024)  Financial Resource Strain: Low Risk  (12/05/2023)   Received from [facility]  Physical Activity: Insufficiently Active (06/24/2023)   Received from [facility]  Social Connections: Patient Unable To Answer (06/30/2024)  Stress: No Stress Concern Present (06/24/2023)   Received from [facility]  Tobacco Use: Low Risk  (06/22/2024)     Readmission Risk Interventions    06/25/2024    1:38 PM 06/19/2024    4:28 PM  Readmission Risk Prevention Plan  Post Dischage Appt  Complete  Medication Screening  Complete  Transportation Screening Complete Complete  PCP or Specialist Appt within 5-7 Days Complete   Home Care Screening Complete   Medication Review (RN CM) Complete

## 2024-07-05 NOTE — Care Management Important Message (Signed)
 Important Message  Patient Details  Name: Jake Ingrum MRN: [id number] Date of Birth: 12-23-1940   Important Message Given:  Yes - Medicare IM     Claretta Deed 07/05/2024, 4:04 PM

## 2024-07-05 NOTE — TOC Initial Note (Addendum)
 Transition of Care [facility]) - Initial/Assessment Note    Patient Details  Name: Jake Bartlett MRN: [id number] Date of Birth: Apr 25, 1941  [facility]) CM/SW Contact:    Inocente GORMAN Kindle, LCSW Phone Number: 07/05/2024, 9:22 AM  Clinical Narrative:                 9am-CSW continuing to follow. Patient with [facility].   12pm-Spouse requesting Hospice facility placement at [facility]. Facility will review referral.  1:32 PM-Hospice able to accept patient today.   Expected Discharge Plan: Skilled Nursing Facility Barriers to Discharge: Continued Medical Work up   Patient Goals and CMS Choice            Expected Discharge Plan and Services In-house Referral: Clinical Social Work   Post Acute Care Choice: Skilled Nursing Facility                                        Prior Living Arrangements/Services     Patient language and need for interpreter reviewed:: Yes Do you feel safe going back to the place where you live?: Yes      Need for Family Participation in Patient Care: Yes (Comment) Care giver support system in place?: Yes (comment)   Criminal Activity/Legal Involvement Pertinent to Current Situation/Hospitalization: No - Comment as needed  Activities of Daily Living      Permission Sought/Granted Permission sought to share information with : Facility Medical sales representative, Family Supports       Permission granted to share info w AGENCY: [place]        Emotional Assessment Appearance:: Appears stated age Attitude/Demeanor/Rapport: Unable to Assess Affect (typically observed): Unable to Assess Orientation: : Oriented to Self Alcohol  / Substance Use: Not Applicable Psych Involvement: No (comment)  Admission diagnosis:  Shock (HCC) [R57.9] Hypoglycemia [E16.2] Unresponsive episode [R40.4] Acute respiratory failure with hypoxia (HCC) [J96.01] Hypotension, unspecified hypotension type [I95.9] Patient Active Problem  List   Diagnosis Date Noted   Acute respiratory failure with hypoxia (HCC) 06/30/2024   Protein-calorie malnutrition, severe 06/29/2024   Shock (HCC) 06/27/2024   Severe sepsis (HCC) 06/16/2024   Sepsis secondary to UTI (HCC) 06/16/2024   Open wound of right heel 06/16/2024   ICH (intracerebral hemorrhage) (HCC) 04/12/2022   Pressure injury of skin 04/12/2022   PCP:  Alyse Bradley, MD Pharmacy:   [facility] [phone] - [place], [place] - [address] AT [facility] [address] [place] [address] Phone: [phone] Fax: [phone]  [place] [facility] [address] [place] [address] Phone: [phone] Fax: [phone]     Social Drivers of Health (SDOH) Social History: SDOH Screenings   Food Insecurity: Patient Unable To Answer (06/30/2024)  Housing: Patient Unable To Answer (06/30/2024)  Transportation Needs: Patient Unable To Answer (06/30/2024)  Utilities: Patient Unable To Answer (06/30/2024)  Financial Resource Strain: Low Risk  (12/05/2023)   Received from [facility]  Physical Activity: Insufficiently Active (06/24/2023)   Received from [facility]  Social Connections: Patient Unable To Answer (06/30/2024)  Stress: No Stress Concern Present (06/24/2023)   Received from [facility]  Tobacco Use: Low Risk  (06/22/2024)   SDOH Interventions: Transportation Interventions: Inpatient TOC   Readmission Risk Interventions    06/25/2024    1:38 PM 06/19/2024    4:28 PM  Readmission Risk  Prevention Plan  Post Dischage Appt  Complete  Medication Screening  Complete  Transportation Screening Complete Complete  PCP or Specialist Appt within 5-7 Days Complete   Home Care Screening Complete   Medication Review (RN CM) Complete

## 2024-07-05 NOTE — Discharge Summary (Signed)
 PATIENT DETAILS Name: Jake Bartlett Age: 83 y.o. Sex: male Date of Birth: 03-25-1941 MRN: [id number]. Admitting Physician: Sammi Fredericks, MD ERE:Bajwzs, Slater, MD  Admit Date: 06/27/2024 Discharge date: 07/05/2024  Recommendations for Outpatient Follow-up:  Optimize comfort measures  Admitted From:  SNF  Disposition: Hospice care   Discharge Condition: poor  CODE STATUS:   Code Status: Do not attempt resuscitation (DNR) - Comfort care   Diet recommendation:  Diet Order             Diet regular Room service appropriate? Yes; Fluid consistency: Thin  Diet effective now           Diet - low sodium heart healthy                    Brief Summary: Patient is a 83 y.o.  male with history of CKD 4, DM-2, CVA, Parkinson's disease-who was recently hospitalized from 8/27-9/5 for UTI-he was ultimately found to have cecal mass-underwent right hemicolectomy (pathology confirmed adenocarcinoma)-brought to the hospital on 9/7 for altered mental status-he was hypoglycemic with EMS-after correction of hypoglycemia-his mental status did not improve-he was subsequently intubated and admitted to the ICU service.  He was ultimately found to have septic shock in the setting of multifocal pneumonia-he was stabilized/extubated and then transferred to [facility].  See below for further details.   Significant events: 9/07>> admitted with septic shock likely due to aspiration pneumonia on vasopressor support  9/08>> central line placed for ongoing pressor support, tube feeds initiated 9/09>>extubated 9/10>> patient off pressors, unable to tolerate swallow, coretrack placed 9/12>> transferred to [facility] 9/15>> transition to comfort measures.   Significant studies: 9/07>> chest x-ray: Dense left retrocardiac opacity-atelectasis versus aspiration pneumonia. 9/07>> CT head: No acute intracranial abnormality 9/07>> CTA chest/abdomen/pelvis: No aneurysm/dissection 9/07>> CT angio head/neck: No LVO,  intracranial atherosclerosis severe right A1 and left P1-P2 junction. 9/08>> MRI brain: No acute intracranial abnormality.   Significant microbiology data: 9/07>> blood culture: No growth 9/08>> tracheal aspirate: Moderate Candida albicans-likely colonization. 9/09>> urine culture: No growth   Procedures: See above   Consults: PCCM Neurology Palliative care  Brief Hospital Course: Acute hypoxic respiratory failure and septic shock secondary to aspiration pneumonia Sepsis physiology has resolved Stable on just 2 L of oxygen On Zosyn -per prior PCCM note-10 days planned-however has been transition to comfort measures on 9/15-no further antibiotics.  See below.   Acute metabolic encephalopathy Secondary to sepsis physiology/hypoxia Although overall improved-mostly sleepy/lethargic-with intermittent waxing/waning hospital delirium.     CKD 4 Close to baseline   Normocytic anemia Secondary to critical illness superimposed on anemia of CKD No evidence of blood loss   DM-2 (A1c 5.8 on 8/27) Apparently hypoglycemic on initial presentation CBGs now stable for the past several days. Plan was to allow permissive hyperglycemia-no plans to check CBC at this point given transition to comfort measures.  HTN BP soft-no longer on midodrine -comfort measures in place.   Oropharyngeal dysphagia Likely secondary to deconditioning/weakness/critical illness Had Cortrak tube in place-but once patient was transition to comfort measures-NG tube has been discontinued-no longer on tube feeds.  Allow comfort feeds at this point.   Hypernatremia Managed with free water  through NG tube Now under comfort care-no plans to recheck electrolytes.   Hypokalemia Repleted.   Left-sided weakness on initial presentation Likely recrudescence of prior CVA in the setting of acute illness/PNA MRI brain negative for acute CVA (CVA ruled out)   Parkinson's disease No longer on Sinemet /Mirapex -comfort  measures.   Mood  disorder No longer on Lexapro -comfort measures.   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   Palliative care/advance directives DNR in place Spoke with spouse on 9/14-she and her children have been discussing end-of-life issues -given advanced age/frailty and his current situation with encephalopathy/weakness.  We discussed options of transitioning to hospice care or giving it another 1-2 days to see if he will improve further before we make any changes.  Subsequently evaluated by palliative care on 9/15-family has agreed to transition to comfort measures-residential hospice start on discharge.  Stable to discharge to residential hospice if bed available today.    Nutrition Status: Nutrition Problem: Severe Malnutrition Etiology: acute illness Signs/Symptoms: percent weight loss, moderate muscle depletion Percent weight loss: 12.4 % Interventions: MVI, Prostat, Tube feeding   Class 2 Obesity: Estimated body mass index is 26.62 kg/m as calculated from the following:   Height as of 06/22/24: 5' 8 (1.727 m).   Weight as of this encounter: 79.4 kg.    Wound care/pressure ulcer Note following wounds present prior to hospitalization Agree with assessment and plan per wound care RN. Wound type:  1.  R heel Unstageable Pressure Injury 80% black eschar 20% pink  2.  L heel Deep Tissue Pressure Injury purple maroon discoloration  3.  Full thickness R posterior leg pink  4. Partial thickness mid back pink  5.  Deep Tissue Pressure Injury sacrum purple maroon discoloration  6.  Partial thickness skin loss B shins pink moist, some scabbed 7. Stage 3 Pressure Injury R ankle/lower anterior leg 50%  pink 50% yellow (from old traction)  8.  Midline abdomen healing surgical incision    Pressure Injury POA: Yes B heels and sacrum/ankle   Dressing procedure/placement/frequency:  Cleanse R heel with Vashe wound cleanser Soila [phone]) do not rinse and allow to air dry. Apply Medihoney to wound bed daily, cover with dry gauze and secure with Kerlix roll gauze.  Cleanse L heel with soap and water , dry and apply Xeroform gauze (Lawson [phone]) to purple discoloration daily.  Secure with silicone foam. Place B feet in Prevalon boots to offload pressure.  Cleanse sacrum/buttocks with soap and water , dry and apply Xeroform gauze Soila [phone]) to purple discoloration daily. Secure with silicone foam.  Cleanse R posterior leg, mid back and B shin wounds with Vashe, cover with Xeroform gauze and secure with silicone foam.  Cleanse R ankle/lower leg wound with Vashe, using a Q tip applicator apply silver hydrofiber Soila [phone] Aquacel AG) to wound bed daily and secure with silicone foam.      Discharge Diagnoses:  Principal Problem:   Shock (HCC) Active Problems:   Protein-calorie malnutrition, severe   Acute respiratory failure with hypoxia [facility])   Discharge Instructions:  Activity:  As tolerated with Full fall precautions use walker/cane & assistance as needed  Discharge Instructions     Diet - low sodium heart healthy   Complete by: As directed    Discharge wound care:   Complete by: As directed    Wound care  Daily      Comments: 1.         Cleanse R heel with Vashe wound cleanser Soila [phone]) do not rinse and allow to air dry. Apply Medihoney to wound bed daily,  cover with dry gauze and secure with Kerlix roll gauze.  2.         Cleanse L heel with soap and water , dry and apply Xeroform gauze (Lawson [phone]) to purple discoloration daily.  Secure with silicone foam. Place B feet in Prevalon boots to offload pressure.  06/28/24 0647    06/28/24 0900    Wound care  Daily       Comments: 1.        Cleanse sacrum/buttocks with soap and water , dry and apply Xeroform gauze Soila [phone]) to purple discoloration daily. Secure with silicone foam.  2.         Cleanse R posterior leg, mid back and B shin wounds with Vashe, cover with Xeroform gauze daily and secure with silicone foam.  3.Cleanse R ankle/lower leg wound with Vashe, using a Q tip applicator apply silver hydrofiber Soila [phone] Aquacel AG) to wound bed daily and secure with silicone foam   Increase activity slowly   Complete by: As directed       Allergies as of 07/05/2024       Reactions   Ropinirole Anaphylaxis, Swelling   Angioedema   Neurontin [gabapentin] Other (See Comments)   Drowsiness        Medication List     STOP taking these medications    carbidopa -levodopa  25-250 MG tablet Commonly known as: SINEMET  IR   carvedilol 3.125 MG tablet Commonly known as: COREG   escitalopram  10 MG tablet Commonly known as: LEXAPRO    feeding supplement Liqd   folic acid  1 MG tablet Commonly known as: FOLVITE    Gerhardt's butt cream Crea   multivitamin tablet   NovoLIN 70/30 Kwikpen (70-30) 100 UNIT/ML KwikPen Generic drug: insulin  isophane & regular human KwikPen   oxyCODONE  5 MG immediate release tablet Commonly known as: Oxy IR/ROXICODONE    pramipexole  0.25 MG tablet Commonly known as: MIRAPEX    rosuvastatin  10 MG tablet Commonly known as: CRESTOR    sodium bicarbonate  650 MG tablet   tamsulosin  0.4 MG Caps capsule Commonly known as: FLOMAX    Vitamin B Complex Tabs   Vitamin D3 10 MCG (400 UNIT) tablet       TAKE these medications    acetaminophen  325 MG tablet Commonly known as: TYLENOL  Take 2 tablets (650 mg total) by mouth every 6 (six) hours as needed for mild pain (pain score 1-3) (or Fever >/= 101). What changed:  medication strength how much to take when to take this reasons to take this   artificial tears ophthalmic solution Place 1 drop into both eyes  4 (four) times daily as needed for dry eyes.   glycopyrrolate  1 MG tablet Commonly known as: ROBINUL  Take 1 tablet (1 mg total) by mouth every 4 (four) hours as needed (excessive secretions).   haloperidol  2 MG/ML solution Commonly known as: HALDOL  Place 1 mL (2 mg total) under the tongue every 6 (six) hours as needed for agitation (or delirium).   HYDROmorphone  1 MG/ML injection Commonly known as: DILAUDID  Inject 0.5 mLs (0.5 mg total) into the vein every 6 (six) hours.   HYDROmorphone  1 MG/ML injection Commonly known as: DILAUDID  Inject 0.5-2 mLs (0.5-2 mg total) into the vein every 30 (thirty) minutes as needed for severe pain (pain score 7-10) (increased work of breathing, RR >25, distress, dyspnea; for pain- 0.5 mg for pain score 7, 1 mg for pain score 8, 1.5 mg for pain score 9, 2 mg for pain score 10).   LORazepam  2 MG/ML concentrated  solution Commonly known as: ATIVAN  Place 0.5 mLs (1 mg total) under the tongue every hour as needed for anxiety, seizure or sleep (distress).   ondansetron  4 MG disintegrating tablet Commonly known as: ZOFRAN -ODT Take 1 tablet (4 mg total) by mouth every 6 (six) hours as needed for nausea.               Discharge Care Instructions  (From admission, onward)           Start     Ordered   07/05/24 0000  Discharge wound care:       Comments: Wound care  Daily      Comments: 1.         Cleanse R heel with Vashe wound cleanser Soila [phone]) do not rinse and allow to air dry. Apply Medihoney to wound bed daily, cover with dry gauze and secure with Kerlix roll gauze.  2.         Cleanse L heel with soap and water , dry and apply Xeroform gauze (Lawson [phone]) to purple discoloration daily.  Secure with silicone foam. Place B feet in Prevalon boots to offload pressure.  06/28/24 0647    06/28/24 0900    Wound care  Daily      Comments: 1.        Cleanse sacrum/buttocks with soap and water , dry and apply Xeroform gauze Soila [phone]) to  purple discoloration daily. Secure with silicone foam.  2.         Cleanse R posterior leg, mid back and B shin wounds with Vashe, cover with Xeroform gauze daily and secure with silicone foam.  3.Cleanse R ankle/lower leg wound with Vashe, using a Q tip applicator apply silver hydrofiber (Lawson [phone] Aquacel AG) to wound bed daily and secure with silicone foam   07/05/24 1130            Follow-up Information     Alyse Bradley, MD Follow up.   Specialty: Internal Medicine Why: As needed Contact information: [address] [place] [address] [phone]                Allergies  Allergen Reactions   Ropinirole Anaphylaxis and Swelling    Angioedema   Neurontin [Gabapentin] Other (See Comments)    Drowsiness     Other Procedures/Studies: DG Abd Portable 1V Result Date: 06/30/2024 EXAM: 1 VIEW XRAY OF THE ABDOMEN 06/30/2024 04:44:00 PM COMPARISON: 06/27/2024 CLINICAL HISTORY: Encounter for feeding tube placement. FINDINGS: BOWEL: Gas-filled loops of small bowel are slightly less prominent than on the prior study. SOFT TISSUES: No opaque urinary calculi. BONES: No acute osseous abnormality. LINES AND TUBES: The tip of a small morphine  tube is in the third portion of the duodenum. IMPRESSION: 1. Tip of a small morphine  tube in the third portion of the duodenum. 2. Gas-filled loops of small bowel are slightly less prominent than on the prior study. Electronically signed by: Lonni Necessary MD 06/30/2024 04:50 PM EDT RP Workstation: [id number]   MR BRAIN WO CONTRAST Result Date: 06/28/2024 CLINICAL DATA:  Follow-up examination for stroke. EXAM: MRI HEAD WITHOUT CONTRAST TECHNIQUE: Multiplanar, multiecho pulse sequences of the brain and surrounding structures were obtained without intravenous contrast. COMPARISON:  Prior CT from 06/27/2024 FINDINGS: Brain: Diffuse prominence of the CSF containing spaces compatible generalized cerebral atrophy, moderate in  nature. T2/FLAIR signal intensity involving the periventricular white matter, consistent with chronic small vessel ischemic disease, mild in nature. Remote right thalamic lacunar infarct with chronic  hemosiderin staining. Few additional small remote lacunar infarcts noted about the bilateral basal ganglia. No abnormal foci of restricted diffusion to suggest acute or subacute ischemia. Gray-white matter differentiation maintained. No acute intracranial hemorrhage. Few additional punctate chronic micro hemorrhages noted, likely small vessel/hypertensive in nature. No mass lesion, midline shift or mass effect. Mild ventricular prominence related to global parenchymal volume loss without hydrocephalus. No extra-axial fluid collection. Pituitary gland within normal limits. Vascular: Major intracranial vascular flow voids are maintained. Skull and upper cervical spine: Craniocervical junction within normal limits. Bone marrow signal intensity overall within normal limits. No scalp soft tissue abnormality. Sinuses/Orbits: Prior bilateral ocular lens replacement. Paranasal sinuses are largely clear. No significant mastoid effusion. Small volume layering secretions noted within the nasopharynx. 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 IMPRESSION: 1. No acute intracranial abnormality. 2. Generalized cerebral atrophy with mild chronic small vessel ischemic disease, with a few scattered remote lacunar infarcts as above. 3. 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 Electronically Signed   By: Morene Hoard M.D.   On: 06/28/2024 23:15   DG CHEST PORT 1 VIEW Result Date: 06/28/2024 CLINICAL DATA:  Central line placement EXAM: PORTABLE CHEST 1 VIEW COMPARISON:  06/27/2024 FINDINGS: A left subclavian central  venous catheter is been placed, extending across midline to project over the upper SVC with tip near the right lateral wall of the SVC. Consider advancement further into the SVC if clinically feasible. No pneumothorax. Nasogastric tube coiled once in the stomach with tip near the gastric cardia. Endotracheal tube tip 0.5 cm above the carina. Atherosclerotic calcification of the aortic arch. Heart size within normal limits for projection. Continued obscuration of the left hemidiaphragm and airspace opacity at the left lung base. Mildly elevated left hemidiaphragm. Bilateral degenerative glenohumeral arthropathy. Thoracic spondylosis. IMPRESSION: 1. Left subclavian central venous catheter tip projects over the upper SVC with tip near the right lateral wall of the SVC. Consider advancement further into the SVC if clinically feasible. No pneumothorax. 2. Continued obscuration of the left hemidiaphragm and airspace opacity at the left lung base. 3. Endotracheal tube tip 0.5 cm above the carina. Electronically Signed   By: Ryan Salvage M.D.   On: 06/28/2024 10:28   EEG adult Result Date: 06/28/2024 Shelton Arlin KIDD, MD     06/28/2024  6:25 AM Patient Name: Josede Cicero MRN: [id number] Epilepsy Attending: Arlin KIDD Shelton Referring Physician/Provider: Remi Pippin, NP Date: 06/27/2024 Duration: 24.05 mins Patient history: 83yo M with ams. EEG to evaluate for seizure Level of alertness: comatose/ lethargic AEDs during EEG study: None Technical aspects: This EEG study was done with scalp electrodes positioned according to the 10-20 International system of electrode placement. Electrical activity was reviewed with band pass filter of 1-70Hz , sensitivity of 7 uV/mm, display speed of 66mm/sec with a 60Hz  notched filter applied as appropriate. EEG data were recorded continuously and digitally stored.  Video monitoring was available and reviewed as appropriate. Description: EEG showed continuous generalized 3 to 6 Hz  theta-delta slowing, at times with triphasic morphology. Hyperventilation and photic stimulation were not performed.   ABNORMALITY - Continuous slow, generalized IMPRESSION: This study is suggestive of severe diffuse encephalopathy, likely secondary to toxic-metabolic causes. No seizures or epileptiform discharges were seen throughout the recording. Priyanka KIDD Shelton   DG Foot 2 Views Right  Result Date: 06/27/2024 CLINICAL DATA:  Osteomyelitis EXAM: RIGHT FOOT - 2 VIEW COMPARISON:  06/16/2024 FINDINGS: Frontal and lateral views of the right foot are obtained. There are no acute displaced fractures. No evidence of bony destruction or periosteal reaction to suggest osteomyelitis. Soft tissue defect within the dorsal aspect of the hindfoot is again seen, unchanged since prior exam. Mild diffuse osteoarthritis. Small inferior calcaneal spur. IMPRESSION: 1. Dorsal soft tissue defect overlying the dorsal margin of the calcaneus. No evidence of underlying bony destruction or periosteal reaction to suggest osteomyelitis. 2. Mild osteoarthritis. Electronically Signed   By: Ozell Daring M.D.   On: 06/27/2024 17:51   DG Abdomen 1 View Result Date: 06/27/2024 CLINICAL DATA:  Enteric catheter placement EXAM: ABDOMEN - 1 VIEW COMPARISON:  06/27/2024 FINDINGS: Single frontal view of the chest and upper abdomen was obtained. Endotracheal tube overlies tracheal air column tip at thoracic inlet. Enteric catheter is identified, with tip and side port likely within the gastric fundus, which is located in a retrocardiac location due to elevation of the left hemidiaphragm. Cardiac silhouette is unremarkable. Small bilateral pleural effusions with patchy bibasilar consolidation again noted unchanged. Distended gas-filled loops of large and small bowel are again seen throughout the abdomen, unchanged since earlier CT. IMPRESSION: 1. Enteric catheter tip and side port likely within the gastric lumen beneath an elevated left  hemidiaphragm. 2. Small bilateral pleural effusions and bibasilar lung consolidation, unchanged. 3. Stable diffuse gaseous distension of the large and small bowel. Electronically Signed   By: Ozell Daring M.D.   On: 06/27/2024 17:49   CT Angio Chest/Abd/Pel for Dissection W and/or W/WO Addendum Date: 06/27/2024 ADDENDUM REPORT: 06/27/2024 16:53 ADDENDUM: Critical Value/emergent results were called by telephone at the time of interpretation on 06/27/2024 at 4:52 pm to provider Dr. Jakie, who verbally acknowledged these results. Electronically Signed   By: Leita Birmingham M.D.   On: 06/27/2024 16:53   Result Date: 06/27/2024 CLINICAL DATA:  Dissection. EXAM: CT ANGIOGRAPHY CHEST, ABDOMEN AND PELVIS TECHNIQUE: Non-contrast CT of the chest was initially obtained. Multidetector CT imaging through the chest, abdomen and pelvis was performed using the standard protocol during bolus administration of intravenous contrast. Multiplanar reconstructed images and MIPs were obtained and reviewed to evaluate the vascular anatomy. RADIATION DOSE REDUCTION: This exam was performed according to the departmental dose-optimization program which includes automated exposure control, adjustment of the mA and/or kV according to patient size and/or use of iterative reconstruction technique. CONTRAST:  OMNIPAQUE  IOHEXOL  350 MG/ML SOLN COMPARISON:  06/21/2024, 06/16/2024. FINDINGS: CTA CHEST FINDINGS Cardiovascular: The heart is enlarged and there is a trace pericardial effusion. Three-vessel coronary artery calcification is present. There is atherosclerotic calcification the aorta without evidence of aneurysm. No dissection is seen. The pulmonary trunk is normal in caliber. Mediastinum/Nodes: No mediastinal, hilar, or axillary lymphadenopathy is seen. The thyroid gland is within normal limits. An endotracheal tube and enteric tube are noted. The enteric tube terminates at the level of the gastroesophageal junction and should be  advanced. Lungs/Pleura: There are small bilateral pleural effusions, slightly increased in size from the prior exam. Bronchial wall thickening is present in the lungs bilaterally with patchy airspace disease and consolidation, most pronounced in the left lower lobe. No pneumothorax is seen. A stable 3 mm nodule is noted in the right upper lobe, axial image 21. Musculoskeletal: Degenerative changes are present in the thoracic spine. No acute osseous abnormality is seen. Review of the MIP images confirms the above findings. CTA ABDOMEN AND  PELVIS FINDINGS VASCULAR Aorta: Normal caliber aorta without aneurysm, dissection, vasculitis or significant stenosis. Aortic atherosclerosis. Celiac: Patent without evidence of aneurysm, dissection, or vasculitis. There is atherosclerotic calcification of the origin resulting in severe stenosis. SMA: Patent without evidence of aneurysm, dissection, or significant stenosis. Atherosclerotic calcification with mural thrombus. There is mild fat stranding about the proximal SMA, axial images 141 through 145. Renals: Both renal arteries are patent without evidence of aneurysm, dissection, vasculitis, fibromuscular dysplasia or significant stenosis. IMA: Patent without evidence of aneurysm, dissection, vasculitis or significant stenosis. Inflow: Patent without evidence of aneurysm, dissection, vasculitis or significant stenosis. Veins: No obvious venous abnormality within the limitations of this arterial phase study. Extensive reflux of contrast is noted in the inferior vena cava. Review of the MIP images confirms the above findings. NON-VASCULAR Hepatobiliary: There is a cyst in the left lobe of the liver. Additional subcentimeter hypodensity is noted which is too small to further characterize. Hypervascular focus is present in the left lobe of the liver measuring 1.6 cm, possible flash filling hemangioma. Stones are present within the gallbladder. No biliary ductal dilatation. Pancreas:  Unremarkable. No pancreatic ductal dilatation or surrounding inflammatory changes. Spleen: Normal in size without focal abnormality. Adrenals/Urinary Tract: The adrenal glands are within normal limits. The kidneys enhance symmetrically. A stable exophytic cyst with rim calcification is noted in the posterior aspect of the left kidney. No renal calculus or hydronephrosis bilaterally. The visualized portion of the urinary bladder is within normal limits. Stomach/Bowel: The stomach is within normal limits. Free air is noted in the abdomen and anterior abdominal wall in the midline and on the left. A mildly distended loop of small bowel is present in the pelvis measuring up to 3.6 cm with no definite transition point. There is thickening of the walls of the rectosigmoid colon with surrounding inflammatory changes. Surgical changes with cecal resection and neo terminal ileum anastomotic site are new from the previous exam. The appendix is not seen. Lymphatic: No abdominal or pelvic lymphadenopathy. Reproductive: Prostate gland is not seen due to streak artifact. A penile prosthesis is noted with reservoir anterior to the bladder. Other: Mild fat stranding is noted in the presacral space and mesentery. A trace amount of ascites is noted in the right pericolic gutter and pelvis. Musculoskeletal: Degenerative changes are present in the lumbar spine. Total hip arthroplasty changes are present on the right. No acute osseous abnormality. Review of the MIP images confirms the above findings. IMPRESSION: 1. Aortic atherosclerosis without evidence of aneurysm or dissection. 2. Postsurgical changes with resection of the cecum and neo terminal ileum anastomosis, new from the previous exam. Moderate amount of free air in the abdomen, likely postsurgical. 3. Mildly distended loops of small bowel in the abdomen without evidence of transition point, possible ileus versus are earlier possible extraction. 4. Segmental thickening of the  colonic and colonic wall thickening involving the sigmoid colon and rectum, which may be infectious or inflammatory. 5. Bronchial wall thickening with areas of mucous plugging at the lung bases and basilar consolidation, concerning for infection. 6. Small bilateral pleural effusions, slightly increased from the prior exam. 7. Enteric tube terminates at the gastroesophageal junction and should be advanced. 8. Cholelithiasis. 9. Coronary artery calcifications. Electronically Signed: By: Leita Birmingham M.D. On: 06/27/2024 16:37   CT ANGIO HEAD NECK W WO CM (CODE STROKE) Result Date: 06/27/2024 CLINICAL DATA:  Neuro deficit, acute, stroke suspected. Hypoglycemia and unresponsiveness. EXAM: CT ANGIOGRAPHY HEAD AND NECK WITH AND WITHOUT CONTRAST TECHNIQUE: Multidetector  CT imaging of the head and neck was performed using the standard protocol during bolus administration of intravenous contrast. Multiplanar CT image reconstructions and MIPs were obtained to evaluate the vascular anatomy. Carotid stenosis measurements (when applicable) are obtained utilizing NASCET criteria, using the distal internal carotid diameter as the denominator. RADIATION DOSE REDUCTION: This exam was performed according to the departmental dose-optimization program which includes automated exposure control, adjustment of the mA and/or kV according to patient size and/or use of iterative reconstruction technique. CONTRAST:  OMNIPAQUE  IOHEXOL  350 MG/ML SOLN COMPARISON:  MRA head and neck 04/12/2022 FINDINGS: CTA NECK FINDINGS Aortic arch: Normal variant aortic arch branching pattern with common origin of the brachiocephalic and left common carotid arteries. Patent brachiocephalic and subclavian arteries with mild atherosclerosis not resulting in significant stenosis. Right carotid system: Patent with a small amount of calcified and soft plaque in the proximal ICA. No evidence of a significant stenosis or dissection. Left carotid system: Patent  with a small amount of calcified and soft plaque at the carotid bifurcation and in the proximal ICA. No evidence of a significant stenosis or dissection. Vertebral arteries: Patent. No evidence of a significant stenosis or dissection of the left vertebral artery which is strongly dominant. Diffusely hypoplastic right vertebral artery. Skeleton: Advanced disc and facet degeneration in the cervical and included upper thoracic spine. Vertebral body and facet ankylosis at C4-5 and vertebral body ankylosis at C5-6. Advanced multilevel neural foraminal stenosis. 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 No evidence of cervical lymphadenopathy. Endotracheal and enteric tubes course through the neck. Upper chest: More fully evaluated on the separately reported CTA of the chest, abdomen, and pelvis. Review of the MIP images confirms the above findings CTA HEAD FINDINGS Evaluation of the small and medium-sized vessels is limited by early contrast timing. Anterior circulation: The internal carotid arteries are patent from skull base to carotid termini are patent from skull base to carotid termini with new mild proximal supraclinoid stenoses bilaterally. The M1 segments and proximal M2 trunks are patent bilaterally without evidence of a significant M1 stenosis. MCA branch vessels are poorly evaluated. The ACAs are patent with new mild diffuse irregular narrowing of the left A1 segment and more severe diffuse irregular narrowing of the right A1 segment. No aneurysm is identified. Posterior circulation: The intracranial left vertebral artery is patent and supplies the basilar, and there is up to mild left V4 stenosis. The intracranial right vertebral artery is small and poorly visualized but appears grossly patent through the PICA origin. Patent SCA origins are visualized bilaterally. The basilar artery is patent with minimal to mild narrowing in its mid and distal  portions. Posterior communicating arteries are diminutive or absent. The PCAs are patent proximally without evidence of a significant proximal stenosis on the right. A severe left PCA stenosis near the P1-P2 junction is new. PCA branch vessels are poorly evaluated. No aneurysm is identified. Venous sinuses: Not evaluated due to early contrast timing. Anatomic variants: Hypoplastic right vertebral artery. Review of the MIP images confirms the above findings The preliminary finding of no emergent large vessel occlusion was communicated to Dr. Nichola at 4:02 pm on 06/27/2024 by text page via the [facility] messaging system. IMPRESSION: 1. No emergent large vessel occlusion. Suboptimal assessment of the intracranial branch vessels. 2. 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 3. Mild cervical carotid artery atherosclerosis without stenosis. 4. 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 5.  Aortic Atherosclerosis (ICD10-I70.0). Electronically Signed   By: Dasie Hamburg M.D.   On: 06/27/2024 16:46   CT HEAD CODE STROKE WO CONTRAST Result Date: 06/27/2024 CLINICAL DATA:  Code stroke. Neuro deficit, acute, stroke suspected. Hypoglycemia and unresponsiveness. EXAM: CT HEAD WITHOUT CONTRAST TECHNIQUE: Contiguous axial images were obtained from the base of the skull through the vertex without intravenous contrast. RADIATION DOSE REDUCTION: This exam was performed according to the departmental dose-optimization program which includes automated exposure control, adjustment of the mA and/or kV according to patient size and/or use of iterative reconstruction technique. COMPARISON:  Head CT and MRI 04/12/2022 FINDINGS: Brain: There is no evidence of an acute infarct, intracranial hemorrhage, mass, midline shift, or extra-axial fluid collection. There is moderate cerebral atrophy. A small focus of encephalomalacia  is present in the right thalamus at the site of the prior hemorrhage. Cerebral white matter hypodensities are nonspecific but compatible with mild chronic small vessel ischemic disease. There is moderate cerebral atrophy. Vascular: Calcified atherosclerosis at the skull base. No hyperdense vessel. Skull: No acute fracture or suspicious lesion. Sinuses/Orbits: Visualized paranasal sinuses and mastoid air cells are clear. Bilateral cataract extraction. Other: None. ASPECTS (Alberta Stroke Program Early CT Score) - Ganglionic level infarction (caudate, lentiform nuclei, internal capsule, insula, M1-M3 cortex): 7 - Supraganglionic infarction (M4-M6 cortex): 3 Total score (0-10 with 10 being normal): 10 These results were communicated to Dr. Nichola at 3:40 pm on 06/27/2024 by text page via the [facility] messaging system. IMPRESSION: 1. No evidence of acute intracranial abnormality. ASPECTS of 10. 2. 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 Electronically Signed   By: Dasie Hamburg M.D.   On: 06/27/2024 15:55   DG Chest Port 1 View Result Date: 06/27/2024 CLINICAL DATA:  Altered mental status EXAM: PORTABLE CHEST 1 VIEW COMPARISON:  Chest radiograph dated 06/16/2024 FINDINGS: Lines/tubes: Endotracheal tube tip projects 3.7 cm above the carina. Lungs: Low lung volumes with bronchovascular crowding. Diffuse perihilar interstitial opacities with more focal linear opacities in the left mid and right lower lungs. Dense left retrocardiac opacity. Pleura: Layering bilateral pleural effusions.  No pneumothorax. Heart/mediastinum: Enlarged cardiomediastinal silhouette is likely projectional. Bones: No acute osseous abnormality. IMPRESSION: 1. Endotracheal tube tip projects 3.7 cm above the carina. 2. Diffuse perihilar interstitial opacities with more focal linear opacities in the left mid and right lower lungs, likely a combination of atelectasis and pulmonary edema. 3. Dense left retrocardiac opacity,  likely atelectasis. Aspiration or pneumonia can be considered in the appropriate clinical setting. 4. Layering bilateral pleural effusions. Electronically Signed   By: Limin  Xu M.D.   On: 06/27/2024 15:39   CT CHEST WO CONTRAST Result Date: 06/21/2024 CLINICAL DATA:  Occult malignancy. EXAM: CT CHEST WITHOUT CONTRAST TECHNIQUE: Multidetector CT imaging of the chest was performed following the standard protocol without IV contrast. RADIATION DOSE REDUCTION: This exam was performed according to the departmental dose-optimization program which includes automated exposure control, adjustment of the mA and/or kV according to patient size and/or use of iterative reconstruction technique. COMPARISON:  06/16/2024, 01/25/2019. FINDINGS: Cardiovascular: The heart is normal in size and there is a trace pericardial effusion. Multi-vessel coronary artery calcifications are noted. There is atherosclerotic calcification of the aorta without evidence of aneurysm. The pulmonary trunk is normal in caliber. Mediastinum/Nodes: There is a prominent subcarinal lymph node measuring 1 cm. No axillary lymphadenopathy. Evaluation of the hila  is limited due to lack of IV contrast. The thyroid gland, trachea, and esophagus are within normal limits. Lungs/Pleura: There is a small pleural effusion on the left and trace pleural effusion on the right. Consolidation is noted in the lower lobes bilaterally, greater on the left than on the right. Atelectasis is noted in the lingular segment of the left upper lobe. No pneumothorax is seen. There is a 3 mm nodule in the right upper lobe, axial image 21. Upper Abdomen: Scattered hypodensities are noted in the liver, likely cysts. There is a cyst in the mid left kidney. No acute abnormality. Gynecomastia is present bilaterally. Musculoskeletal: Degenerative changes are present in the thoracic spine and bilateral shoulders. No acute or suspicious osseous abnormality is seen. IMPRESSION: 1. No focal  mass is seen. 2. Bilateral lower lobe consolidation, possible atelectasis or infiltrate. Follow-up is recommended until resolution to exclude the possibility of underlying malignancy. 3. Small bilateral pleural effusions. 4. Coronary artery calcifications. 5. Aortic atherosclerosis. Electronically Signed   By: Leita Birmingham M.D.   On: 06/21/2024 16:31   DG Ankle 2 Views Right Result Date: 06/16/2024 CLINICAL DATA:  Repeat lateral image of the right ankle. EXAM: RIGHT ANKLE - 2 VIEW COMPARISON:  Same day radiographs of the right ankle dated 06/16/2024 at 12:13 p.m. FINDINGS: Soft tissue defect/wound overlying the posterior calcaneus. No radiographic evidence of acute osteolysis or erosive changes. Plantar calcaneal spur. IMPRESSION: Soft tissue defect/wound overlying the posterior calcaneus without radiographic evidence to suggest acute osteomyelitis at this time. Electronically Signed   By: Harrietta Sherry M.D.   On: 06/16/2024 14:35   CT ABDOMEN PELVIS WO CONTRAST Result Date: 06/16/2024 CLINICAL DATA:  Abdominal pain, nausea vomiting and diarrhea. EXAM: CT ABDOMEN AND PELVIS WITHOUT CONTRAST TECHNIQUE: Multidetector CT imaging of the abdomen and pelvis was performed following the standard protocol without IV contrast. RADIATION DOSE REDUCTION: This exam was performed according to the departmental dose-optimization program which includes automated exposure control, adjustment of the mA and/or kV according to patient size and/or use of iterative reconstruction technique. COMPARISON:  CT abdomen pelvis dated 02/13/2023. FINDINGS: Evaluation of this exam is limited in the absence of intravenous contrast. Lower chest: Partially visualized small left pleural effusion. There is coronary vascular calcification. No intra-abdominal free air or free fluid. Hepatobiliary: Small cyst in the left lobe of the liver. No biliary dilatation. The gallbladder is unremarkable. Pancreas: Unremarkable. No pancreatic ductal  dilatation or surrounding inflammatory changes. Spleen: Normal in size without focal abnormality. Adrenals/Urinary Tract: The adrenal glands unremarkable. No significant interval change in the left renal interpolar cyst. Tiny nonobstructing left renal calculi measure up to 2 mm. No hydronephrosis. There is no hydronephrosis or nephrolithiasis on the right. The visualized ureters appear unremarkable. There is slight thickened and irregular appearance of the left lateral bladder wall which may be related to underdistention. A urothelial neoplasm is not excluded. Further evaluation with cystoscopy is recommended. Stomach/Bowel: Significant interval increase in the size of the cecal mass seen on the prior CT filling of the cecum and measuring approximately 4.3 x 8.0 cm (coronal 60/8). This is consistent with a colonic malignancy. Further evaluation with colonoscopy is recommended. There is sigmoid diverticulosis without active inflammatory changes. There is no bowel obstruction. The appendix is not visualized with certainty. No inflammatory changes identified in the right lower quadrant. For Vascular/Lymphatic: Moderate aortoiliac atherosclerotic disease. The IVC is unremarkable. No portal venous gas. There is no adenopathy. A subcentimeter lymph node posterior to the cecum and anterior  to the right psoas muscle measures 6 mm in short axis slightly increased since the prior CT. Reproductive: The prostate and seminal vesicles are grossly unremarkable. Penile implant with reservoir in the anterior pelvis. Other: None Musculoskeletal: Osteopenia with degenerative changes of the spine. Total right hip arthroplasty. No acute osseous pathology. IMPRESSION: 1. 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 Further evaluation with colonoscopy is recommended. 2. Sigmoid diverticulosis. No bowel obstruction. 3. Tiny nonobstructing left renal calculi. No hydronephrosis. 4. Slight  thickened and irregular appearance of the left lateral bladder wall. A urothelial neoplasm is not excluded. Further evaluation with cystoscopy is recommended. 5.  Aortic Atherosclerosis (ICD10-I70.0). Electronically Signed   By: Vanetta Chou M.D.   On: 06/16/2024 14:00   DG Ankle Complete Right Result Date: 06/16/2024 EXAM: 3 or more VIEW(S) XRAY OF THE RIGHT ANKLE 06/16/2024 12:17:00 PM CLINICAL HISTORY: Weakness, wound infection - He was released from [facility], post rehab for broken hip recently. Since discharge, he has experienced weakness. He also had nausea, vomiting and diarrhea a few days ago. Pt has a wound on his right anterior ankle from; his leg being in traction from a recent right hip fracture. COMPARISON: None available. FINDINGS: BONES AND JOINTS: Calcaneal spur. No acute fracture. No joint dislocation. SOFT TISSUES: Mild diffuse soft tissue swelling. Soft tissue defect is noted posterior to the calcaneus with underlying focal area of osteopenia involving the posterior calcaneus. Note: this area is obscured by external artifact. If there is a focal wound in this area, I would suggest repeating the lateral projection radiograph of the ankle after removal of the external artifact to confirm presence of focal bone erosion which may reflect osteomyelitis of the calcaneus. IMPRESSION: 1. Soft tissue defect posterior to the calcaneus with underlying focal osteopenia, obscured by external artifact. If a focal wound is present in this area, I would recommend repeat lateral radiograph after artifact removal to confirm possible bone erosion, which may reflect osteomyelitis. 2. Mild diffuse soft tissue swelling. Electronically signed by: Waddell Calk MD 06/16/2024 12:50 PM EDT RP Workstation: [id number]   DG Chest Portable 1 View Result Date: 06/16/2024 EXAM: 1 VIEW XRAY OF THE CHEST 06/16/2024 12:17:00 PM COMPARISON: 03/23/2024 CLINICAL HISTORY: Weakness, wound infection. Patient was released  from [facility], post rehab for broken hip recently. Since discharge, he has experienced weakness. He also had nausea, vomiting and diarrhea a few days ago. Patient has a wound on his right anterior ankle from his leg being in traction from a recent right hip fracture. FINDINGS: LUNGS AND PLEURA: Low lung volumes with elevated left hemidiaphragm. Mild left lung base atelectasis. No focal pulmonary opacity. No pulmonary edema. No pleural effusion. No pneumothorax. HEART AND MEDIASTINUM: Aortic atherosclerosis. No acute abnormality of the cardiac and mediastinal silhouettes. BONES AND SOFT TISSUES: Remote healed left lateral rib fracture deformity. No acute osseous abnormality. IMPRESSION: 1. Low lung volumes with elevated left hemidiaphragm and mild left lung base atelectasis. Electronically signed by: Waddell Calk MD 06/16/2024 12:46 PM EDT RP Workstation: [id number]     TODAY-DAY OF DISCHARGE:  Subjective:   Elna Search today remains sleepy/lethargic-unchanged.  Objective:   Blood pressure (!) 101/54, pulse 87, temperature 97.6 F (36.4 C), temperature source Axillary, resp. rate 19, weight 79.4 kg, SpO2 100%.  Intake/Output Summary (Last 24 hours) at 07/05/2024 1130 Last data filed at 07/05/2024 0900 Gross per 24 hour  Intake 900 ml  Output 1900 ml  Net -1000 ml  Filed Weights   07/04/24 0500 07/04/24 1208 07/05/24 0500  Weight: 107.5 kg 77.5 kg 79.4 kg    Exam: Sleepy-lethargic but awakes easily-no new F.N deficits, Normal affect [place].AT,PERRAL Supple Neck,No JVD, No cervical lymphadenopathy appriciated.  Symmetrical Chest wall movement, Good air movement bilaterally, CTAB RRR,No Gallops,Rubs or new Murmurs, No Parasternal Heave +ve B.Sounds, Abd Soft, Non tender, No organomegaly appriciated, No rebound -guarding or rigidity. No Cyanosis, Clubbing or edema, No new Rash or bruise   PERTINENT RADIOLOGIC STUDIES: No results found.   PERTINENT LAB RESULTS: CBC: Recent  Labs    07/04/24 0345 07/05/24 0440  WBC 10.5 11.0*  HGB 7.8* 7.8*  HCT 24.6* 25.8*  PLT 266 214   CMET CMP     Component Value Date/Time   NA 147 (H) 07/05/2024 0440   K 4.0 07/05/2024 0440   CL 109 07/05/2024 0440   CO2 22 07/05/2024 0440   GLUCOSE 236 (H) 07/05/2024 0440   BUN 41 (H) 07/05/2024 0440   CREATININE 2.42 (H) 07/05/2024 0440   CALCIUM  7.4 (L) 07/05/2024 0440   PROT 4.4 (L) 06/28/2024 0247   ALBUMIN  <1.5 (L) 06/28/2024 0247   AST 17 06/28/2024 0247   ALT <5 06/28/2024 0247   ALKPHOS 112 06/28/2024 0247   BILITOT 0.6 06/28/2024 0247   GFRNONAA 26 (L) 07/05/2024 0440    GFR Estimated Creatinine Clearance: 22 mL/min (A) (by C-G formula based on SCr of 2.42 mg/dL (H)). No results for input(s): LIPASE, AMYLASE in the last 72 hours. No results for input(s): CKTOTAL, CKMB, CKMBINDEX, TROPONINI in the last 72 hours. Invalid input(s): POCBNP No results for input(s): DDIMER in the last 72 hours. No results for input(s): HGBA1C in the last 72 hours. No results for input(s): CHOL, HDL, LDLCALC, TRIG, CHOLHDL, LDLDIRECT in the last 72 hours. No results for input(s): TSH, T4TOTAL, T3FREE, THYROIDAB in the last 72 hours.  Invalid input(s): FREET3 No results for input(s): VITAMINB12, FOLATE, FERRITIN, TIBC, IRON , RETICCTPCT in the last 72 hours. Coags: No results for input(s): INR in the last 72 hours.  Invalid input(s): PT Microbiology: Recent Results (from the past 240 hours)  Blood Culture (routine x 2)     Status: None   Collection Time: 06/27/24  3:40 PM   Specimen: BLOOD  Result Value Ref Range Status   Specimen Description BLOOD LEFT ANTECUBITAL  Final   Special Requests   Final    BOTTLES DRAWN AEROBIC AND ANAEROBIC Blood Culture results may not be optimal due to an inadequate volume of blood received in culture bottles   Culture   Final    NO GROWTH 5 DAYS Performed at [facility], [address], [place], [place] [address]    Report Status 07/02/2024 FINAL  Final  MRSA Next Gen by PCR, Nasal     Status: None   Collection Time: 06/27/24  4:30 PM   Specimen: Nasal Mucosa; Nasal Swab  Result Value Ref Range Status   MRSA by PCR Next Gen NOT DETECTED NOT DETECTED Final    Comment: (NOTE) The GeneXpert MRSA Assay (FDA approved for NASAL specimens only), is one component of a comprehensive MRSA colonization surveillance program. It is not intended to diagnose MRSA infection nor to guide or monitor treatment for MRSA infections. Test performance is not FDA approved in patients less than 30 years old. Performed at [facility], [address], [place], [place] [address]   Blood Culture (routine x 2)  Status: None   Collection Time: 06/27/24  6:34 PM   Specimen: BLOOD RIGHT HAND  Result Value Ref Range Status   Specimen Description BLOOD RIGHT HAND  Final   Special Requests   Final    BOTTLES DRAWN AEROBIC ONLY Blood Culture results may not be optimal due to an inadequate volume of blood received in culture bottles   Culture   Final    NO GROWTH 5 DAYS Performed at [facility], [address], [place], [place] [address]    Report Status 07/02/2024 FINAL  Final  Culture, Respiratory w Gram Stain     Status: None   Collection Time: 06/28/24  8:00 AM   Specimen: Tracheal Aspirate; Respiratory  Result Value Ref Range Status   Specimen Description TRACHEAL ASPIRATE  Final   Special Requests NONE  Final   Gram Stain   Final    MODERATE WBC PRESENT, PREDOMINANTLY PMN FEW SQUAMOUS EPITHELIAL CELLS PRESENT MODERATE YEAST FEW GRAM POSITIVE RODS Performed at [facility], [address], [place], [place] [address]    Culture MODERATE CANDIDA ALBICANS  Final   Report Status 07/01/2024 FINAL  Final  Urine Culture     Status: None   Collection Time: 06/29/24 11:07 PM   Specimen: Urine, Random  Result Value Ref Range Status   Specimen Description URINE,  RANDOM  Final   Special Requests NONE Reflexed from [id number]  Final   Culture   Final    NO GROWTH Performed at [facility], [address] [place], [place] [address]    Report Status 06/30/2024 FINAL  Final    FURTHER DISCHARGE INSTRUCTIONS:  Get Medicines reviewed and adjusted: Please take all your medications with you for your next visit with your Primary MD  Laboratory/radiological data: Please request your Primary MD to go over all hospital tests and procedure/radiological results at the follow up, please ask your Primary MD to get all Hospital records sent to his/her office.  In some cases, they will be blood work, cultures and biopsy results pending at the time of your discharge. Please request that your primary care M.D. goes through all the records of your hospital data and follows up on these results.  Also Note the following: If you experience worsening of your admission symptoms, develop shortness of breath, life threatening emergency, suicidal or homicidal thoughts you must seek medical attention immediately by calling 911 or calling your MD immediately  if symptoms less severe.  You must read complete instructions/literature along with all the possible adverse reactions/side effects for all the Medicines you take and that have been prescribed to you. Take any new Medicines after you have completely understood and accpet all the possible adverse reactions/side effects.   Do not drive when taking Pain medications or sleeping medications (Benzodaizepines)  Do not take more than prescribed Pain, Sleep and Anxiety Medications. It is not advisable to combine anxiety,sleep and pain medications without talking with your primary care practitioner  Special Instructions: If you have smoked or chewed Tobacco  in the last 2 yrs please stop smoking, stop any regular Alcohol   and or any Recreational drug use.  Wear Seat belts while driving.  Please note: You were cared for by a  hospitalist during your hospital stay. Once you are discharged, your primary care physician will handle any further medical issues. Please note that NO REFILLS for any discharge medications will be authorized once you are discharged, as it is imperative that you  return to your primary care physician (or establish a relationship with a primary care physician if you do not have one) for your post hospital discharge needs so that they can reassess your need for medications and monitor your lab values.  Total Time spent coordinating discharge including counseling, education and face to face time equals greater than 30 minutes.  Signed: Yuvin Bussiere 07/05/2024 11:30 AM

## 2024-07-05 NOTE — Progress Notes (Signed)
 Nutrition Brief Note  Chart reviewed this morning prior to meeting patient at bedside.  Noted pt now transitioning to comfort care.   No further nutrition interventions planned at this time.  Please re-consult as needed.   Blair Deaner MS, RD, LDN Registered Dietitian Clinical Nutrition RD Inpatient Contact Info in Amion

## 2024-07-06 ENCOUNTER — Telehealth: Payer: Self-pay | Admitting: *Deleted

## 2024-07-06 NOTE — Telephone Encounter (Signed)
 Called and spoke with wife Tandy to discuss upcoming appt here at [facility] on Thursday. Mrs Bunda requested cancellation of appt that Mr Pursley has been admitted into hospice facility in [facility]   Appointment canceled

## 2024-07-14 ENCOUNTER — Ambulatory Visit (HOSPITAL_BASED_OUTPATIENT_CLINIC_OR_DEPARTMENT_OTHER): Admitting: General Surgery

## 2024-07-15 ENCOUNTER — Ambulatory Visit: Admitting: Oncology

## 2024-07-21 DEATH — deceased
